# Patient Record
Sex: Male | Born: 1955 | Race: Black or African American | Hispanic: No | Marital: Married | Smoking: Never smoker
Health system: Southern US, Community
[De-identification: ages and names within clinical notes are randomized; demographics above are authoritative.]

## PROBLEM LIST (undated history)

## (undated) DIAGNOSIS — G4730 Sleep apnea, unspecified: Secondary | ICD-10-CM

## (undated) DIAGNOSIS — C61 Malignant neoplasm of prostate: Secondary | ICD-10-CM

## (undated) DIAGNOSIS — E785 Hyperlipidemia, unspecified: Secondary | ICD-10-CM

## (undated) DIAGNOSIS — D649 Anemia, unspecified: Secondary | ICD-10-CM

## (undated) DIAGNOSIS — H269 Unspecified cataract: Secondary | ICD-10-CM

## (undated) DIAGNOSIS — K635 Polyp of colon: Secondary | ICD-10-CM

## (undated) DIAGNOSIS — R7989 Other specified abnormal findings of blood chemistry: Secondary | ICD-10-CM

## (undated) DIAGNOSIS — Z9109 Other allergy status, other than to drugs and biological substances: Secondary | ICD-10-CM

## (undated) DIAGNOSIS — I10 Essential (primary) hypertension: Secondary | ICD-10-CM

## (undated) DIAGNOSIS — N200 Calculus of kidney: Secondary | ICD-10-CM

## (undated) DIAGNOSIS — E0590 Thyrotoxicosis, unspecified without thyrotoxic crisis or storm: Secondary | ICD-10-CM

## (undated) HISTORY — DX: Essential (primary) hypertension: I10

## (undated) HISTORY — DX: Anemia, unspecified: D64.9

## (undated) HISTORY — DX: Unspecified cataract: H26.9

## (undated) HISTORY — DX: Hyperlipidemia, unspecified: E78.5

## (undated) HISTORY — DX: Polyp of colon: K63.5

## (undated) HISTORY — DX: Sleep apnea, unspecified: G47.30

## (undated) HISTORY — DX: Calculus of kidney: N20.0

## (undated) HISTORY — DX: Other specified abnormal findings of blood chemistry: R79.89

## (undated) HISTORY — DX: Other allergy status, other than to drugs and biological substances: Z91.09

## (undated) HISTORY — PX: PROSTATE BIOPSY: SHX241

## (undated) HISTORY — PX: VASECTOMY: SHX75

---

## 1997-07-18 ENCOUNTER — Emergency Department (HOSPITAL_COMMUNITY): Admission: EM | Admit: 1997-07-18 | Discharge: 1997-07-18 | Payer: Self-pay | Admitting: Emergency Medicine

## 1998-06-07 ENCOUNTER — Encounter: Admission: RE | Admit: 1998-06-07 | Discharge: 1998-09-05 | Payer: Self-pay | Admitting: *Deleted

## 2000-08-20 ENCOUNTER — Encounter (INDEPENDENT_AMBULATORY_CARE_PROVIDER_SITE_OTHER): Payer: Self-pay

## 2000-08-20 ENCOUNTER — Other Ambulatory Visit: Admission: RE | Admit: 2000-08-20 | Discharge: 2000-08-20 | Payer: Self-pay | Admitting: Urology

## 2003-01-28 ENCOUNTER — Ambulatory Visit (HOSPITAL_COMMUNITY): Admission: RE | Admit: 2003-01-28 | Discharge: 2003-01-28 | Payer: Self-pay | Admitting: Urology

## 2003-01-28 ENCOUNTER — Ambulatory Visit (HOSPITAL_BASED_OUTPATIENT_CLINIC_OR_DEPARTMENT_OTHER): Admission: RE | Admit: 2003-01-28 | Discharge: 2003-01-28 | Payer: Self-pay | Admitting: Urology

## 2003-01-28 ENCOUNTER — Encounter (INDEPENDENT_AMBULATORY_CARE_PROVIDER_SITE_OTHER): Payer: Self-pay | Admitting: *Deleted

## 2010-04-04 ENCOUNTER — Ambulatory Visit (HOSPITAL_BASED_OUTPATIENT_CLINIC_OR_DEPARTMENT_OTHER)
Admission: RE | Admit: 2010-04-04 | Discharge: 2010-04-04 | Payer: Self-pay | Source: Home / Self Care | Attending: Family Medicine | Admitting: Family Medicine

## 2010-07-08 ENCOUNTER — Other Ambulatory Visit: Payer: Self-pay | Admitting: Family Medicine

## 2010-07-08 ENCOUNTER — Ambulatory Visit
Admission: RE | Admit: 2010-07-08 | Discharge: 2010-07-08 | Disposition: A | Discharge status: Home / Self Care | Payer: BC Managed Care – PPO | Source: Ambulatory Visit | Attending: Family Medicine | Admitting: Family Medicine

## 2010-07-08 DIAGNOSIS — R109 Unspecified abdominal pain: Secondary | ICD-10-CM

## 2010-07-13 ENCOUNTER — Ambulatory Visit
Admission: RE | Admit: 2010-07-13 | Discharge: 2010-07-13 | Disposition: A | Discharge status: Home / Self Care | Payer: BC Managed Care – PPO | Source: Ambulatory Visit | Attending: Family Medicine | Admitting: Family Medicine

## 2010-07-13 ENCOUNTER — Other Ambulatory Visit: Payer: Self-pay | Admitting: Family Medicine

## 2010-07-13 DIAGNOSIS — R110 Nausea: Secondary | ICD-10-CM

## 2010-07-13 MED ORDER — IOHEXOL 300 MG/ML  SOLN
125.0000 mL | Freq: Once | INTRAMUSCULAR | Status: AC | PRN
  Administered 2010-07-13: 125 mL via INTRAVENOUS

## 2010-08-08 ENCOUNTER — Other Ambulatory Visit (HOSPITAL_COMMUNITY): Payer: Self-pay | Admitting: Urology

## 2010-08-08 ENCOUNTER — Encounter (HOSPITAL_COMMUNITY): Payer: BC Managed Care – PPO

## 2010-08-08 ENCOUNTER — Other Ambulatory Visit: Payer: Self-pay | Admitting: Urology

## 2010-08-08 ENCOUNTER — Ambulatory Visit (HOSPITAL_COMMUNITY)
Admission: RE | Admit: 2010-08-08 | Discharge: 2010-08-08 | Disposition: A | Discharge status: Home / Self Care | Payer: BC Managed Care – PPO | Source: Ambulatory Visit | Attending: Urology | Admitting: Urology

## 2010-08-08 DIAGNOSIS — Z01818 Encounter for other preprocedural examination: Secondary | ICD-10-CM | POA: Insufficient documentation

## 2010-08-08 DIAGNOSIS — I10 Essential (primary) hypertension: Secondary | ICD-10-CM | POA: Insufficient documentation

## 2010-08-08 DIAGNOSIS — Z01812 Encounter for preprocedural laboratory examination: Secondary | ICD-10-CM | POA: Insufficient documentation

## 2010-08-08 DIAGNOSIS — Z87891 Personal history of nicotine dependence: Secondary | ICD-10-CM | POA: Insufficient documentation

## 2010-08-08 LAB — CBC
HCT: 34.9 % — ABNORMAL LOW (ref 39.0–52.0)
Hemoglobin: 11.8 g/dL — ABNORMAL LOW (ref 13.0–17.0)
MCH: 29.8 pg (ref 26.0–34.0)
MCHC: 33.8 g/dL (ref 30.0–36.0)
MCV: 88.1 fL (ref 78.0–100.0)
Platelets: 272 10*3/uL (ref 150–400)
RBC: 3.96 MIL/uL — ABNORMAL LOW (ref 4.22–5.81)
RDW: 12.3 % (ref 11.5–15.5)
WBC: 6.5 10*3/uL (ref 4.0–10.5)

## 2010-08-08 LAB — BASIC METABOLIC PANEL
BUN: 17 mg/dL (ref 6–23)
CO2: 26 mEq/L (ref 19–32)
Calcium: 9.1 mg/dL (ref 8.4–10.5)
Chloride: 105 mEq/L (ref 96–112)
Creatinine, Ser: 1.52 mg/dL — ABNORMAL HIGH (ref 0.4–1.5)
GFR calc Af Amer: 59 mL/min — ABNORMAL LOW (ref >60)
GFR calc non Af Amer: 48 mL/min — ABNORMAL LOW (ref >60)
Glucose, Bld: 208 mg/dL — ABNORMAL HIGH (ref 70–99)
Potassium: 4.2 mEq/L (ref 3.5–5.1)
Sodium: 139 mEq/L (ref 135–145)

## 2010-08-08 LAB — SURGICAL PCR SCREEN
MRSA, PCR: NEGATIVE
Staphylococcus aureus: NEGATIVE

## 2010-08-10 ENCOUNTER — Ambulatory Visit (HOSPITAL_COMMUNITY)
Admission: RE | Admit: 2010-08-10 | Discharge: 2010-08-10 | Disposition: A | Discharge status: Home / Self Care | Payer: BC Managed Care – PPO | Source: Ambulatory Visit | Attending: Urology | Admitting: Urology

## 2010-08-10 DIAGNOSIS — Z01812 Encounter for preprocedural laboratory examination: Secondary | ICD-10-CM | POA: Insufficient documentation

## 2010-08-10 DIAGNOSIS — I10 Essential (primary) hypertension: Secondary | ICD-10-CM | POA: Insufficient documentation

## 2010-08-10 DIAGNOSIS — N201 Calculus of ureter: Secondary | ICD-10-CM | POA: Insufficient documentation

## 2010-08-10 DIAGNOSIS — R109 Unspecified abdominal pain: Secondary | ICD-10-CM | POA: Insufficient documentation

## 2010-08-10 DIAGNOSIS — R112 Nausea with vomiting, unspecified: Secondary | ICD-10-CM | POA: Insufficient documentation

## 2010-08-10 LAB — GLUCOSE, CAPILLARY
Glucose-Capillary: 145 mg/dL — ABNORMAL HIGH (ref 70–99)
Glucose-Capillary: 152 mg/dL — ABNORMAL HIGH (ref 70–99)

## 2010-08-15 NOTE — Op Note (Signed)
NAMEMANVILLE, WOLAVER              ACCOUNT NO.:  [id number]  MEDICAL RECORD NO.:  [id number]           PATIENT TYPE:  O  LOCATION:  DAYL                         FACILITY:  [facility]  PHYSICIAN:  Bernestine Amass, M.D.  DATE OF BIRTH:  March 28, 1956  DATE OF PROCEDURE:  08/10/2010 DATE OF DISCHARGE:  08/10/2010                              OPERATIVE REPORT   PREOPERATIVE DIAGNOSIS:  Left distal ureteral calculus.  POSTOPERATIVE DIAGNOSIS:  Impacted left distal ureteral calculus.  PROCEDURES PERFORMED: 1. Cystoscopy. 2. Left retrograde pyelogram. 3. Ureteroscopy. 4. Holmium laser lithotripsy. 5. Basketing of stone fragments. 6. Left double-J stent placement.  SURGEON:  Bernestine Amass, M.D.  ANESTHESIA:  General.  INDICATIONS:  Mr. Lister is 55 years of age.  He had presented with left flank pain and was diagnosed with a 6-mm stone in his distal left ureter.  The patient had had the stone present for 3 to 4 weeks.  He has continued to have intermittent discomfort and has had no sign of any progression of passage of the stone.  In addition, he was scheduled to leave town for an extended training session and therefore wanted to have a definitive procedure performed.  Given the size of the stone and the situation, we felt that was quite prudent.  We suggested ureteroscopy as his best chance for definitive management.  That recommendation was accepted.  The patient has given full informed consent and presents now for the procedure.  TECHNIQUE AND FINDINGS:  The patient was brought to the operating room. He had placement of PAS compression boots and received perioperative ciprofloxacin.  He had successful induction of general anesthesia and was placed in lithotomy position and prepped and draped in usual manner. Cystoscopy revealed mild trilobar hyperplasia with unremarkable anterior urethra and bladder other than some very mild left hemitrigonal edema.  Retrograde pyelogram was done  with fluoroscopic interpretation using an open-ended catheter.  A 6-mm filling defect causing very high-grade obstruction was noted approximately 3 cm above his ureterovesical junction.  A very little to no contrast was able to get beyond the stone.  Through the open-ended catheter, we attempted for approximately 15 to 20 minutes to get a guidewire past the stone.  This included a standard sensor wire as well as a Glidewire but despite multiple manipulations through the open-ended catheter, we were unable to get access beyond the stone.  For that reason, I did engage the distal left ureter with the 6.5 French rigid ureteroscope.  We were able to carefully advance the scope to the heavily impacted stone.  Through the ureteroscope, I was then able to get a guidewire beyond the stone and with fluoroscopic guidance confirmed that to be in the left renal pelvis.  The ureteroscope was then removed from the ureter, leaving the guidewire behind.  An open- ended catheter was then placed.  With contrast injection, we confirmed that the wire was in good position and the patient did have substantially dilated proximal ureter and collecting system.  We then re- engaged the distal ureter with the ureteroscope following the wire up to the stone.  We then used a  holmium laser lithotriptor fiber to fracture the stone.  The stone itself was markedly dense.  We were able to eventually get this to fracture into 4 fragments.  Each of these pieces was then carefully basket extracted.  The patient had really significant mucosal edema, inflammation and mild oozing from where the stone had been heavily impacted.  There was no sign of any ureteral perforation or injury.  We felt it prudent to leave a double-J stent and a 7-French 24- cm stent was placed over the wire with fluoroscopic guidance and confirmed to be in good position.  The patient's bladder was emptied and he was brought to recovery room in stable  condition.     Bernestine Amass, M.D.     DSG/MEDQ  D:  08/11/2010  T:  08/12/2010  Job:  [id number]  Electronically Signed by Rana Snare M.D. on 08/15/2010 12:10:42 PM

## 2010-09-02 NOTE — Op Note (Signed)
NAME:  NEHEMIAH, SNARR                        ACCOUNT NO.:  [id number]   MEDICAL RECORD NO.:  [id number]                   PATIENT TYPE:  AMB   LOCATION:  [facility]                                 FACILITY:  [facility]   PHYSICIAN:  Bernestine Amass, M.D.               DATE OF BIRTH:  1955-04-27   DATE OF PROCEDURE:  01/28/2003  DATE OF DISCHARGE:                                 OPERATIVE REPORT   PREOPERATIVE DIAGNOSIS:  Request for elective sterilization status post  previous vasectomy with ongoing spermatogenesis.   POSTOPERATIVE DIAGNOSIS:  Request for elective sterilization status post  previous vasectomy with ongoing spermatogenesis.   PROCEDURE PERFORMED:  Redo bilateral vasectomy.   SURGEON:  Bernestine Amass, M.D.   ANESTHESIA:  MAC.   INDICATIONS FOR PROCEDURE:  Mr. Sidor is a 55 year old male. Approximately  2 to 2 1/2 years ago, he had a vasectomy. He came back for a followup visit  but never brought any of his semen analysis post vasectomy for assessment.  He brought one in recently which showed over 600,000,000 sperm. We reviewed  the pathology from the time of his surgery and it did show two vasal  sections. Reviewing the operative report at the time did reveal a very tight  scrotum. 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 We talked to him about several options including redo  vasectomy. We felt it might be better to do in an outpatient surgery center  where we might have ability to better relax the patient and decrease the  operative difficulties. He elected to proceed with repeat vasectomy. He  appears to understand the advantages and disadvantages and potential  complications.   TECHNIQUE:  The patient was brought to the operating room where he had  intravenous sedation. He was prepped and draped in the usual manner. Heat  was utilized to help relax the scrotum. We were able to palpate the right  vas. It was pinched  underneath the scrotal skin which was then anesthetized.  We made a small 1/2 cm to 1 cm incision overlying the vas which was then  grasped with special vas grasper. The adventitia was cleared for  approximately 2 cm and proximal and distal ends were clamped. A 1 cm plus  segment was then removed. Both ends were cauterized with a needle cautery  within the lumen and then tied. The more proximal end was buried in a  separate adventitial plane. The skin was then reapproximated with some  interrupted Monocryl. The same thing was done on the contralateral side. The  patient appeared to tolerate the procedure well. There were no obvious  complications.  Bernestine Amass, M.D.    DSG/MEDQ  D:  01/28/2003  T:  01/28/2003  Job:  [id number]

## 2010-11-26 LAB — HM COLONOSCOPY

## 2011-07-17 LAB — HM DIABETES EYE EXAM

## 2011-07-31 ENCOUNTER — Encounter: Payer: BC Managed Care – PPO | Attending: Family Medicine | Admitting: Dietician

## 2011-07-31 DIAGNOSIS — Z713 Dietary counseling and surveillance: Secondary | ICD-10-CM | POA: Insufficient documentation

## 2011-07-31 DIAGNOSIS — E119 Type 2 diabetes mellitus without complications: Secondary | ICD-10-CM | POA: Insufficient documentation

## 2011-08-01 ENCOUNTER — Encounter: Payer: Self-pay | Admitting: Dietician

## 2011-08-01 NOTE — Progress Notes (Signed)
  Patient was seen on 07/31/2011 for the first of a series of three diabetes self-management courses at the [facility]. The following learning objectives were met by the patient during this course:   Defines the role of glucose and insulin  Identifies type of diabetes and pathophysiology  Defines the diagnostic criteria for diabetes and prediabetes  States the risk factors for Type 2 Diabetes  States the symptoms of Type 2 Diabetes  Defines Type 2 Diabetes treatment goals  Defines Type 2 Diabetes treatment options  States the rationale for glucose monitoring  Identifies A1C, glucose targets, and testing times  Identifies proper sharps disposal  Defines the purpose of a diabetes food plan  Identifies carbohydrate food groups  Defines effects of carbohydrate foods on glucose levels  Identifies carbohydrate choices/grams/food labels  States benefits of physical activity and effect on glucose  Review of suggested activity guidelines  HgA1C:  10.1% (07/27/2011)  Handouts given during class include:  Type 2 Diabetes: Basics Book  My [place] and Activity Log  Patient has established the following initial goals:  Increase exercise  Monitor blood glucose  Keep doctors appointment  Take medications appropriately  Lose weight  Follow-Up Plan: Core Class 2 within 1 month

## 2011-08-23 IMAGING — CR DG CHEST 2V
2 series · 2 of 2 positions shown · non-contrast
Comparison: Abdominal series 07/08/2010.

CLINICAL DATA: Preop for cystoscopy.  History of hypertension.  Ex-
smoker.

CHEST - 2 VIEW

[w chest pa]
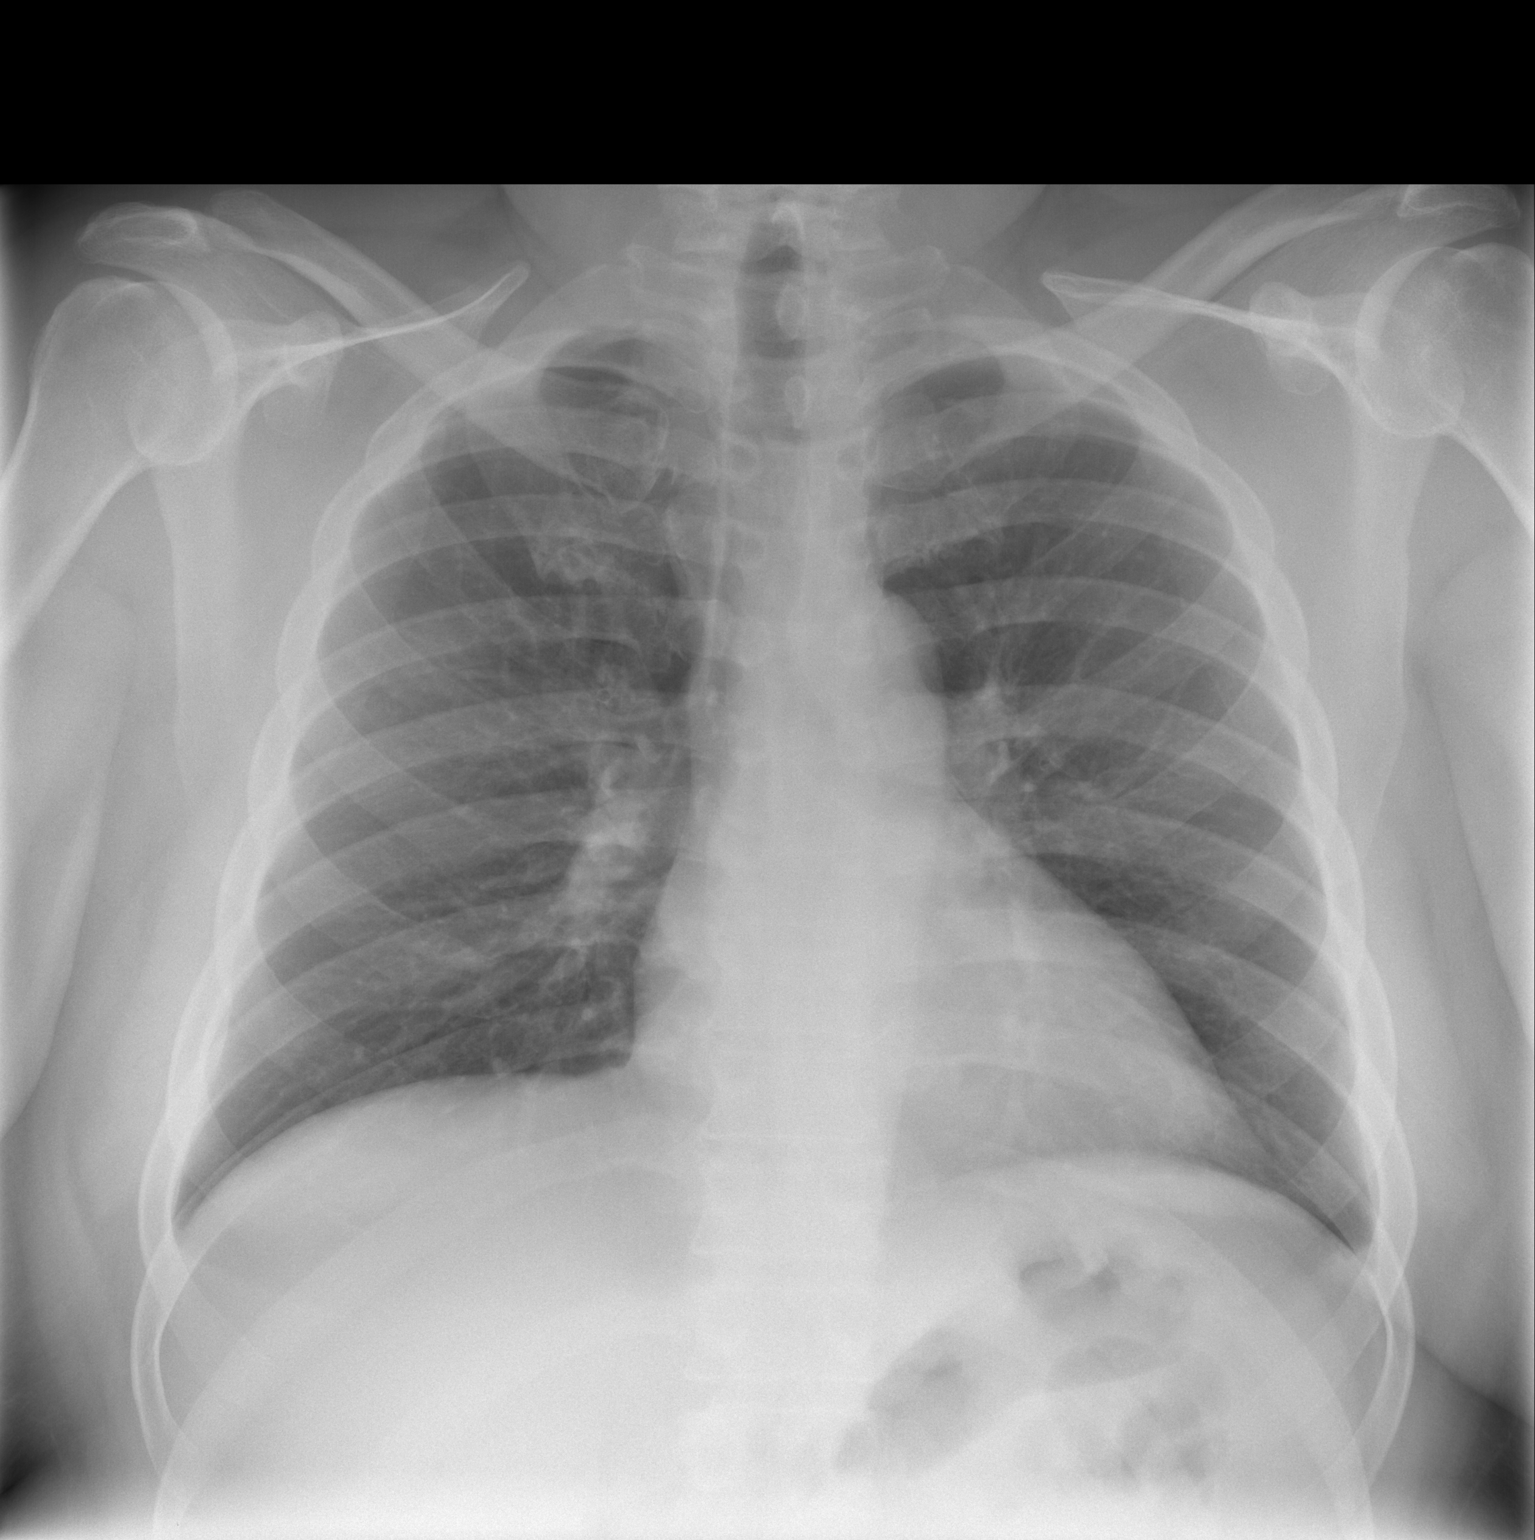

[w chest lat]
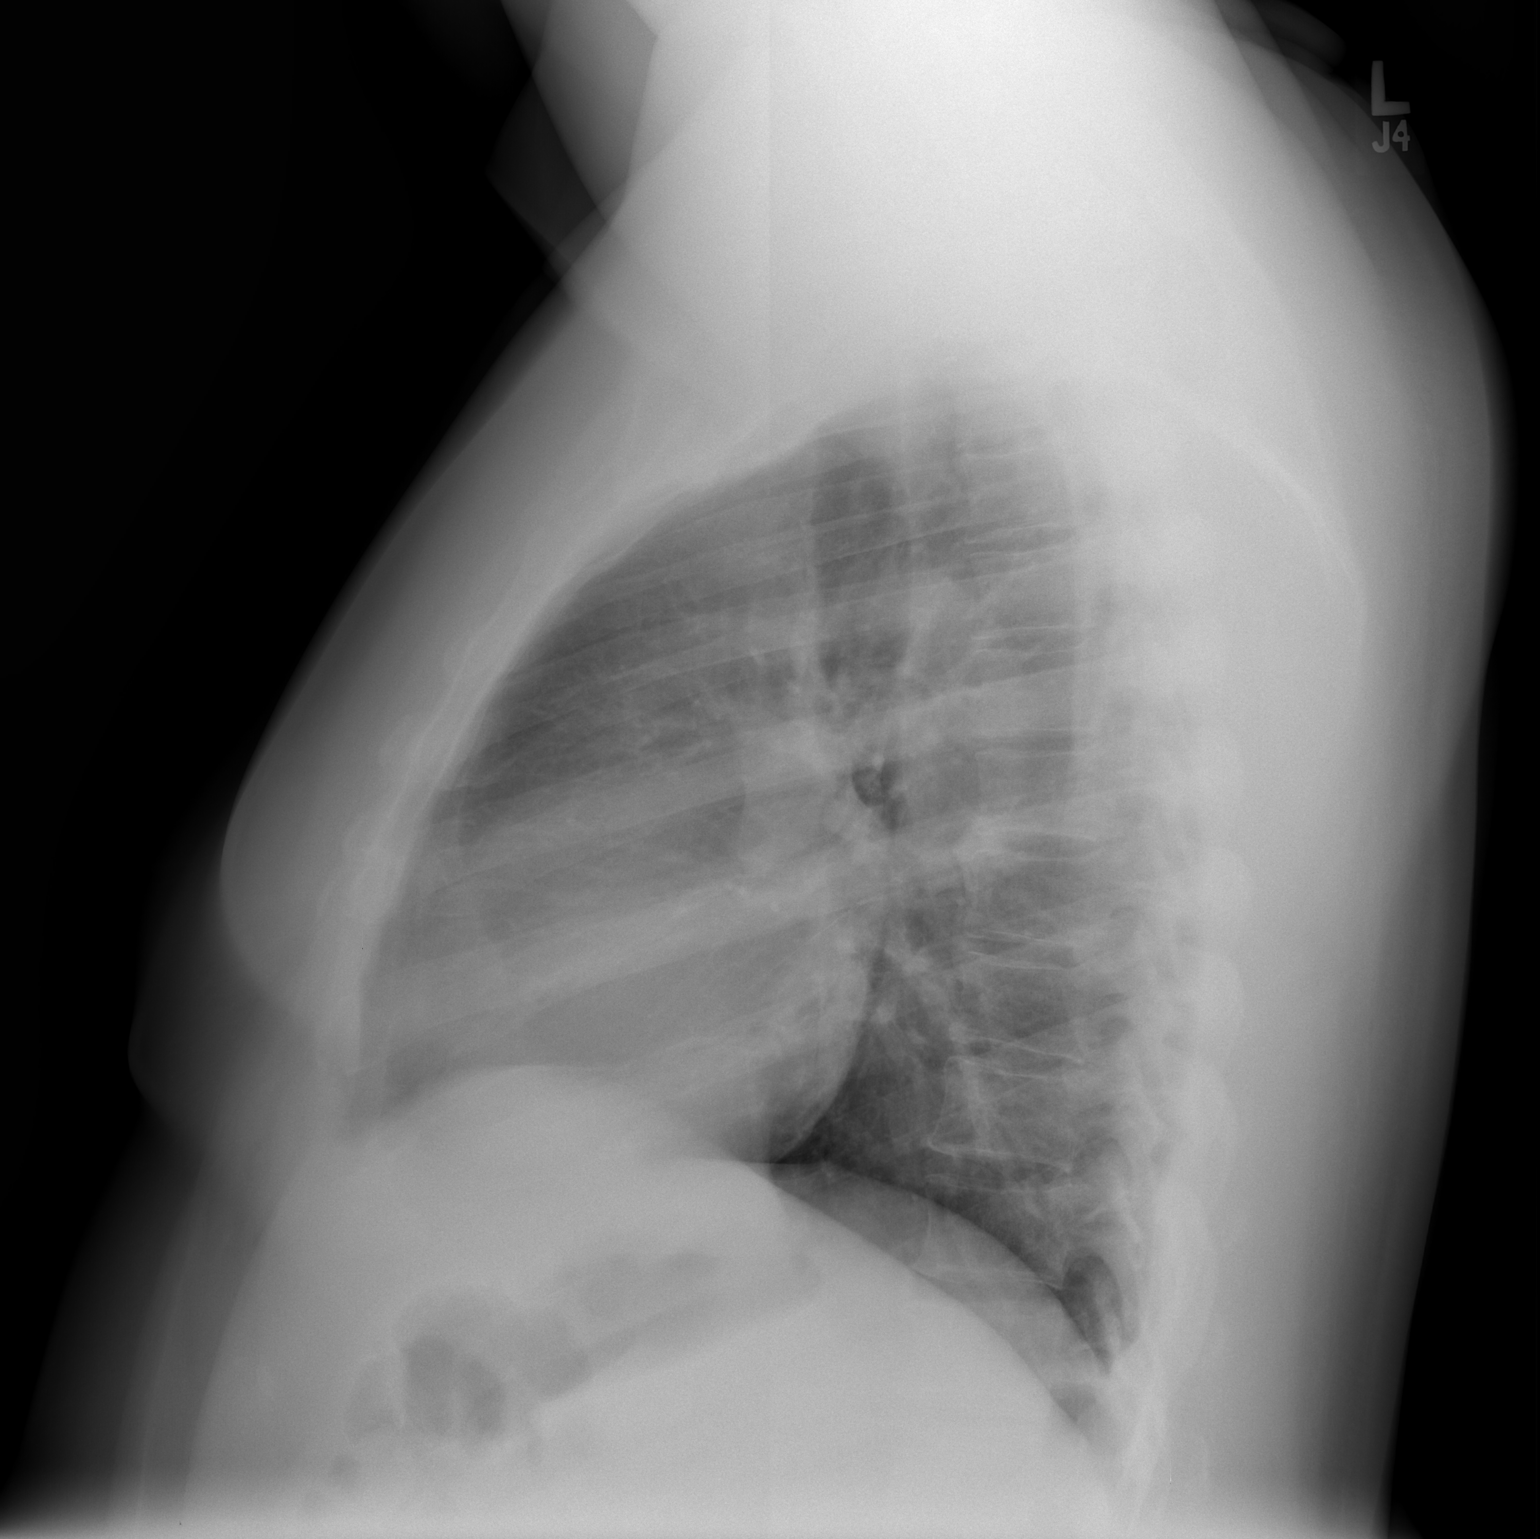

[2 of 2 positions shown; findings below may reference images not displayed]

FINDINGS: The heart size and mediastinal contours are stable.  The
lungs are clear.  There is no pleural effusion or pneumothorax.
The osseous structures appear unchanged.
IMPRESSION: Stable examination.  No active cardiopulmonary process.

## 2011-08-29 ENCOUNTER — Encounter: Payer: Federal, State, Local not specified - PPO | Attending: Family Medicine

## 2011-08-29 DIAGNOSIS — E119 Type 2 diabetes mellitus without complications: Secondary | ICD-10-CM | POA: Insufficient documentation

## 2011-08-29 DIAGNOSIS — Z713 Dietary counseling and surveillance: Secondary | ICD-10-CM | POA: Insufficient documentation

## 2011-09-05 ENCOUNTER — Ambulatory Visit: Payer: Federal, State, Local not specified - PPO

## 2012-04-17 LAB — HM DIABETES FOOT EXAM: HM Diabetic Foot Exam: 5800

## 2012-10-05 ENCOUNTER — Encounter (HOSPITAL_BASED_OUTPATIENT_CLINIC_OR_DEPARTMENT_OTHER): Payer: Self-pay | Admitting: *Deleted

## 2012-10-05 ENCOUNTER — Emergency Department (HOSPITAL_BASED_OUTPATIENT_CLINIC_OR_DEPARTMENT_OTHER)
Admission: EM | Admit: 2012-10-05 | Discharge: 2012-10-05 | Disposition: A | Discharge status: Home / Self Care | Payer: BC Managed Care – PPO | Attending: Emergency Medicine | Admitting: Emergency Medicine

## 2012-10-05 DIAGNOSIS — H6092 Unspecified otitis externa, left ear: Secondary | ICD-10-CM

## 2012-10-05 DIAGNOSIS — I10 Essential (primary) hypertension: Secondary | ICD-10-CM | POA: Insufficient documentation

## 2012-10-05 DIAGNOSIS — H6692 Otitis media, unspecified, left ear: Secondary | ICD-10-CM

## 2012-10-05 DIAGNOSIS — Z79899 Other long term (current) drug therapy: Secondary | ICD-10-CM | POA: Insufficient documentation

## 2012-10-05 DIAGNOSIS — E119 Type 2 diabetes mellitus without complications: Secondary | ICD-10-CM | POA: Insufficient documentation

## 2012-10-05 DIAGNOSIS — H6690 Otitis media, unspecified, unspecified ear: Secondary | ICD-10-CM | POA: Insufficient documentation

## 2012-10-05 DIAGNOSIS — E785 Hyperlipidemia, unspecified: Secondary | ICD-10-CM | POA: Insufficient documentation

## 2012-10-05 DIAGNOSIS — H60399 Other infective otitis externa, unspecified ear: Secondary | ICD-10-CM | POA: Insufficient documentation

## 2012-10-05 LAB — GLUCOSE, CAPILLARY: Glucose-Capillary: 318 mg/dL — ABNORMAL HIGH (ref 70–99)

## 2012-10-05 MED ORDER — AMOXICILLIN 250 MG PO CAPS: 250.0000 mg | ORAL_CAPSULE | Freq: Three times a day (TID) | ORAL | Status: DC

## 2012-10-05 MED ORDER — NEOMYCIN-COLIST-HC-THONZONIUM 3.3-3-10-0.5 MG/ML OT SUSP
4.0000 [drp] | Freq: Once | OTIC | Status: AC
  Administered 2012-10-05: 4 [drp] via OTIC
  Filled 2012-10-05: qty 5

## 2012-10-05 NOTE — ED Provider Notes (Signed)
Medical screening examination/treatment/procedure(s) were performed by non-physician practitioner and as supervising physician I was immediately available for consultation/collaboration.   Mylinda Latina III, MD 10/05/12 [phone]

## 2012-10-05 NOTE — ED Provider Notes (Signed)
History     CSN: [id number]  Arrival date & time 10/05/12  1411   First MD Initiated Contact with Patient 10/05/12 1430      Chief Complaint  Patient presents with  . Otalgia    (Consider location/radiation/quality/duration/timing/severity/associated sxs/prior treatment) HPI Alex Rocha is a 57 y.o. male who presents to the ED with ear ache in the left ear.  The ear was hurting some but he recently had a business trip that required air travel and now the pain is worse. The history was provided by the patient.   Past Medical History  Diagnosis Date  . Hyperlipidemia   . Hypertension   . Diabetes mellitus     History reviewed. No pertinent past surgical history.  History reviewed. No pertinent family history.  History  Substance Use Topics  . Smoking status: Never Smoker   . Smokeless tobacco: Not on file  . Alcohol Use: Yes      Review of Systems  Constitutional: Negative for fever and chills.  HENT: Positive for ear pain. Negative for sore throat, sneezing, neck pain and sinus pressure.   Respiratory: Negative for cough and wheezing.   Cardiovascular: Negative for chest pain.  Gastrointestinal: Negative for nausea and vomiting.  Musculoskeletal: Negative for back pain.  Psychiatric/Behavioral: The patient is not nervous/anxious.     Allergies  Review of patient's allergies indicates no known allergies.  Home Medications   Current Outpatient Rx  Name  Route  Sig  Dispense  Refill  . lisinopril (PRINIVIL,ZESTRIL) 40 MG tablet   Oral   Take 40 mg by mouth daily.         . metFORMIN (GLUCOPHAGE) 1000 MG tablet   Oral   Take 1,000 mg by mouth 2 (two) times daily with a meal.         . pravastatin (PRAVACHOL) 20 MG tablet   Oral   Take 20 mg by mouth daily.         . sitaGLIPtin (JANUVIA) 100 MG tablet   Oral   Take 100 mg by mouth daily.           BP 146/81  Pulse 104  Temp(Src) 98.5 F (36.9 C) (Oral)  Resp 20  Ht 6\' 1"  (1.854 m)   Wt 265 lb (120.203 kg)  BMI 34.97 kg/m2  SpO2 98%  Physical Exam  Nursing note and vitals reviewed. Constitutional: He is oriented to person, place, and time. He appears well-developed and well-nourished. No distress.  HENT:  Head: Normocephalic.  Right Ear: Tympanic membrane normal.  Left Ear: There is swelling and tenderness. Tympanic membrane is erythematous and retracted.  Mouth/Throat: Uvula is midline and mucous membranes are normal.  Left ear canal with swelling and exudate.  Eyes: EOM are normal.  Neck: Neck supple.  Pulmonary/Chest: Effort normal.  Musculoskeletal: Normal range of motion.  Neurological: He is alert and oriented to person, place, and time. No cranial nerve deficit.  Skin: Skin is warm and dry.  Psychiatric: He has a normal mood and affect. His behavior is normal.    ED Course  Procedures (including critical care time)   MDM  57 y.o. male with inner and external otitis. Will treat with antibiotics and Cortisporin Otic. Discussed with the patient clinical findings and plan of care and all questioned fully answered. He will return if any problems arise.    Medication List    TAKE these medications       amoxicillin 250 MG capsule  Commonly  known as:  AMOXIL  Take 1 capsule (250 mg total) by mouth 3 (three) times daily.      ASK your doctor about these medications       lisinopril 40 MG tablet  Commonly known as:  PRINIVIL,ZESTRIL  Take 40 mg by mouth daily.     metFORMIN 1000 MG tablet  Commonly known as:  GLUCOPHAGE  Take 1,000 mg by mouth 2 (two) times daily with a meal.     pravastatin 20 MG tablet  Commonly known as:  PRAVACHOL  Take 20 mg by mouth daily.     sitaGLIPtin 100 MG tablet  Commonly known as:  JANUVIA  Take 100 mg by mouth daily.              [place], [place] 10/05/12 [phone]

## 2012-10-05 NOTE — ED Notes (Signed)
CBG 318.  RN notified

## 2012-10-05 NOTE — ED Notes (Signed)
Pt states he flew to [facility] June 4th and left ear has been bothering him since Sometimes has problems with right.

## 2012-11-25 ENCOUNTER — Encounter: Payer: Self-pay | Admitting: Family Medicine

## 2012-11-25 ENCOUNTER — Ambulatory Visit (INDEPENDENT_AMBULATORY_CARE_PROVIDER_SITE_OTHER): Payer: BC Managed Care – PPO | Admitting: Family Medicine

## 2012-11-25 VITALS — BP 110/70 | HR 62 | Temp 98.7°F | Ht 74.0 in | Wt 273.2 lb

## 2012-11-25 DIAGNOSIS — H6690 Otitis media, unspecified, unspecified ear: Secondary | ICD-10-CM

## 2012-11-25 DIAGNOSIS — E1165 Type 2 diabetes mellitus with hyperglycemia: Secondary | ICD-10-CM

## 2012-11-25 DIAGNOSIS — E119 Type 2 diabetes mellitus without complications: Secondary | ICD-10-CM

## 2012-11-25 DIAGNOSIS — E785 Hyperlipidemia, unspecified: Secondary | ICD-10-CM

## 2012-11-25 DIAGNOSIS — IMO0002 Reserved for concepts with insufficient information to code with codable children: Secondary | ICD-10-CM

## 2012-11-25 DIAGNOSIS — E291 Testicular hypofunction: Secondary | ICD-10-CM

## 2012-11-25 DIAGNOSIS — R7989 Other specified abnormal findings of blood chemistry: Secondary | ICD-10-CM

## 2012-11-25 DIAGNOSIS — H6692 Otitis media, unspecified, left ear: Secondary | ICD-10-CM | POA: Insufficient documentation

## 2012-11-25 DIAGNOSIS — I10 Essential (primary) hypertension: Secondary | ICD-10-CM | POA: Insufficient documentation

## 2012-11-25 MED ORDER — ACETIC ACID 2 % OT SOLN: OTIC | Status: DC

## 2012-11-25 MED ORDER — CEFUROXIME AXETIL 500 MG PO TABS: 500.0000 mg | ORAL_TABLET | Freq: Two times a day (BID) | ORAL | Status: AC

## 2012-11-25 NOTE — Assessment & Plan Note (Signed)
Pt has had multiple abx and gtts Refer to ENT See meds and orders

## 2012-11-25 NOTE — Assessment & Plan Note (Signed)
stable °

## 2012-11-25 NOTE — Progress Notes (Signed)
  Subjective:     Alex Rocha is a 57 y.o. male who presents with ear pain and possible ear infection. Symptoms include: left ear pain, congestion and plugged sensation in the left ear. Onset of symptoms was several weeks ago, and have been gradually worsening since that time. Associated symptoms include: congestion and headache.  Patient denies: chills, fever , post nasal drip and productive cough. He is drinking plenty of fluids.  The following portions of the patient's history were reviewed and updated as appropriate: allergies, current medications, past family history, past medical history, past social history, past surgical history and problem list.  Review of Systems Pertinent items are noted in HPI.   Objective:    BP 110/70  Pulse 62  Temp(Src) 98.7 F (37.1 C) (Oral)  Ht 6\' 2"  (1.88 m)  Wt 273 lb 3.2 oz (123.923 kg)  BMI 35.06 kg/m2  SpO2 95% General:  alert, cooperative, appears stated age and mild distress  Right Ear: normal  Left Ear: Tm is errythematous and _ + fluid  Mouth:  lips, mucosa, and tongue normal; teeth and gums normal  Neck: mild anterior cervical adenopathy, supple, symmetrical, trachea midline and thyroid not enlarged, symmetric, no tenderness/mass/nodules   cor--+S1S2 no murmur Lungs--+CTABL  No rrw Ext--- no CCE, Sensory exam of the foot is normal, tested with the monofilament. Good pulses, no lesions or ulcers, good peripheral pulses.   Assessment:    Left acute otitis media   Plan:    Treatment: ceftin, vosol. OTC analgesia as needed. Fluids, rest, avoid carbonated/alcoholic and caffeinated beverages.  Follow up in a few weeks Refer to ENT and return if not improving.

## 2012-11-25 NOTE — Patient Instructions (Addendum)
Preventive Care for Adults, Male  A healthy lifestyle and preventive care can promote health and wellness. Preventive health guidelines for men include the following key practices:  · A routine yearly physical is a good way to check with your caregiver about your health and preventative screening. It is a chance to share any concerns and updates on your health, and to receive a thorough exam.  · Visit your dentist for a routine exam and preventative care every 6 months. Brush your teeth twice a day and floss once a day. Good oral hygiene prevents tooth decay and gum disease.  · The frequency of eye exams is based on your age, health, family medical history, use of contact lenses, and other factors. Follow your caregiver's recommendations for frequency of eye exams.  · Eat a healthy diet. Foods like vegetables, fruits, whole grains, low-fat dairy products, and lean protein foods contain the nutrients you need without too many calories. Decrease your intake of foods high in solid fats, added sugars, and salt. Eat the right amount of calories for you. Get information about a proper diet from your caregiver, if necessary.  · Regular physical exercise is one of the most important things you can do for your health. Most adults should get at least 150 minutes of moderate-intensity exercise (any activity that increases your heart rate and causes you to sweat) each week. In addition, most adults need muscle-strengthening exercises on 2 or more days a week.  · Maintain a healthy weight. The body mass index (BMI) is a screening tool to identify possible weight problems. It provides an estimate of body fat based on height and weight. Your caregiver can help determine your BMI, and can help you achieve or maintain a healthy weight. For adults 20 years and older:  · A BMI below 18.5 is considered underweight.  · A BMI of 18.5 to 24.9 is normal.  · A BMI of 25 to 29.9 is considered overweight.  · A BMI of 30 and above is  considered obese.  · Maintain normal blood lipids and cholesterol levels by exercising and minimizing your intake of saturated fat. Eat a balanced diet with plenty of fruit and vegetables. Blood tests for lipids and cholesterol should begin at age 20 and be repeated every 5 years. If your lipid or cholesterol levels are high, you are over 50, or you are a high risk for heart disease, you may need your cholesterol levels checked more frequently. Ongoing high lipid and cholesterol levels should be treated with medicines if diet and exercise are not effective.  · If you smoke, find out from your caregiver how to quit. If you do not use tobacco, do not start.  · If you choose to drink alcohol, do not exceed 2 drinks per day. One drink is considered to be 12 ounces (355 mL) of beer, 5 ounces (148 mL) of wine, or 1.5 ounces (44 mL) of liquor.  · Avoid use of street drugs. Do not share needles with anyone. Ask for help if you need support or instructions about stopping the use of drugs.  · High blood pressure causes heart disease and increases the risk of stroke. Your blood pressure should be checked at least every 1 to 2 years. Ongoing high blood pressure should be treated with medicines, if weight loss and exercise are not effective.  · If you are 45 to 55 years old, ask your caregiver if you should take aspirin to prevent heart disease.  · Diabetes screening involves taking   a blood sample to check your fasting blood sugar level. This should be done once every 3 years, after age 45, if you are within normal weight and without risk factors for diabetes. Testing should be considered at a younger age or be carried out more frequently if you are overweight and have at least 1 risk factor for diabetes.  · Colorectal cancer can be detected and often prevented. Most routine colorectal cancer screening begins at the age of 50 and continues through age 75. However, your caregiver may recommend screening at an earlier age if you  have risk factors for colon cancer. On a yearly basis, your caregiver may provide home test kits to check for hidden blood in the stool. Use of a small camera at the end of a tube, to directly examine the colon (sigmoidoscopy or colonoscopy), can detect the earliest forms of colorectal cancer. Talk to your caregiver about this at age 50, when routine screening begins.  Direct examination of the colon should be repeated every 5 to 10 years through age 75, unless early forms of pre-cancerous polyps or small growths are found.  · Hepatitis C blood testing is recommended for all people born from 1945 through 1965 and any individual with known risks for hepatitis C.  · Practice safe sex. Use condoms and avoid high-risk sexual practices to reduce the spread of sexually transmitted infections (STIs). STIs include gonorrhea, chlamydia, syphilis, trichomonas, herpes, HPV, and human immunodeficiency virus (HIV). Herpes, HIV, and HPV are viral illnesses that have no cure. They can result in disability, cancer, and death.  · A one-time screening for abdominal aortic aneurysm (AAA) and surgical repair of large AAAs by sound wave imaging (ultrasonography) is recommended for ages 65 to 75 years who are current or former smokers.  · Healthy men should no longer receive prostate-specific antigen (PSA) blood tests as part of routine cancer screening. Consult with your caregiver about prostate cancer screening.  · Testicular cancer screening is not recommended for adult males who have no symptoms. Screening includes self-exam, caregiver exam, and other screening tests. Consult with your caregiver about any symptoms you have or any concerns you have about testicular cancer.  · Use sunscreen with skin protection factor (SPF) of 30 or more. Apply sunscreen liberally and repeatedly throughout the day. You should seek shade when your shadow is shorter than you. Protect yourself by wearing long sleeves, pants, a wide-brimmed hat, and  sunglasses year round, whenever you are outdoors.  · Once a month, do a whole body skin exam, using a mirror to look at the skin on your back. Notify your caregiver of new moles, moles that have irregular borders, moles that are larger than a pencil eraser, or moles that have changed in shape or color.  · Stay current with required immunizations.  · Influenza. You need a dose every fall (or winter). The composition of the flu vaccine changes each year, so being vaccinated once is not enough.  · Pneumococcal polysaccharide. You need 1 to 2 doses if you smoke cigarettes or if you have certain chronic medical conditions. You need 1 dose at age 65 (or older) if you have never been vaccinated.  · Tetanus, diphtheria, pertussis (Tdap, Td). Get 1 dose of Tdap vaccine if you are younger than age 65 years, are over 65 and have contact with an infant, are a healthcare worker, or simply want to be protected from whooping cough. After that, you need a Td booster dose every 10 years. Consult your   caregiver if you have not had at least 3 tetanus and diphtheria-containing shots sometime in your life or have a deep or dirty wound.  · HPV. This vaccine is recommended for males 13 through 55 years of age. This vaccine may be given to men 22 through 55 years of age who have not completed the 3 dose series. It is recommended for men through age 26 who have sex with men or whose immune system is weakened because of HIV infection, other illness, or medications. The vaccine is given in 3 doses over 6 months.  · Measles, mumps, rubella (MMR). You need at least 1 dose of MMR if you were born in 1957 or later. You may also need a 2nd dose.  · Meningococcal. If you are age 19 to 21 years and a first-year college student living in a residence hall, or have one of several medical conditions, you need to get vaccinated against meningococcal disease. You may also need additional booster doses.  · Zoster (shingles). If you are age 60 years or  older, you should get this vaccine.  · Varicella (chickenpox). If you have never had chickenpox or you were vaccinated but received only 1 dose, talk to your caregiver to find out if you need this vaccine.  · Hepatitis A. You need this vaccine if you have a specific risk factor for hepatitis A virus infection, or you simply wish to be protected from this disease. The vaccine is usually given as 2 doses, 6 to 18 months apart.  · Hepatitis B. You need this vaccine if you have a specific risk factor for hepatitis B virus infection or you simply wish to be protected from this disease. The vaccine is given in 3 doses, usually over 6 months.  Preventative Service / Frequency  Ages 19 to 39  · Blood pressure check.** / Every 1 to 2 years.  · Lipid and cholesterol check.** / Every 5 years beginning at age 20.  · Hepatitis C blood test.** / For any individual with known risks for hepatitis C.  · Skin self-exam. / Monthly.  · Influenza immunization.** / Every year.  · Pneumococcal polysaccharide immunization.** / 1 to 2 doses if you smoke cigarettes or if you have certain chronic medical conditions.  · Tetanus, diphtheria, pertussis (Tdap,Td) immunization. / A one-time dose of Tdap vaccine. After that, you need a Td booster dose every 10 years.  · HPV immunization. / 3 doses over 6 months, if 26 and younger.  · Measles, mumps, rubella (MMR) immunization. / You need at least 1 dose of MMR if you were born in 1957 or later. You may also need a 2nd dose.  · Meningococcal immunization. / 1 dose if you are age 19 to 21 years and a first-year college student living in a residence hall, or have one of several medical conditions, you need to get vaccinated against meningococcal disease. You may also need additional booster doses.  · Varicella immunization.** / Consult your caregiver.  · Hepatitis A immunization.** / Consult your caregiver. 2 doses, 6 to 18 months apart.  · Hepatitis B immunization.** / Consult your caregiver. 3 doses  usually over 6 months.  Ages 40 to 64  · Blood pressure check.** / Every 1 to 2 years.  · Lipid and cholesterol check.** / Every 5 years beginning at age 20.  · Fecal occult blood test (FOBT) of stool. / Every year beginning at age 50 and continuing until age 75. You may not have   to do this test if you get colonoscopy every 10 years.  · Flexible sigmoidoscopy** or colonoscopy.** / Every 5 years for a flexible sigmoidoscopy or every 10 years for a colonoscopy beginning at age 50 and continuing until age 75.  · Hepatitis C blood test.** / For all people born from 1945 through 1965 and any individual with known risks for hepatitis C.  · Skin self-exam. / Monthly.  · Influenza immunization.** / Every year.  · Pneumococcal polysaccharide immunization.** / 1 to 2 doses if you smoke cigarettes or if you have certain chronic medical conditions.  · Tetanus, diphtheria, pertussis (Tdap/Td) immunization.** / A one-time dose of Tdap vaccine. After that, you need a Td booster dose every 10 years.  · Measles, mumps, rubella (MMR) immunization.  / You need at least 1 dose of MMR if you were born in 1957 or later. You may also need a 2nd dose.  · Varicella immunization.**/ Consult your caregiver.  · Meningococcal immunization.** / Consult your caregiver.  · Hepatitis A immunization.** / Consult your caregiver. 2 doses, 6 to 18 months apart.  · Hepatitis B immunization.** / Consult your caregiver. 3 doses, usually over 6 months.  Ages 65 and over  · Blood pressure check.** / Every 1 to 2 years.  · Lipid and cholesterol check.**/ Every 5 years beginning at age 20.  · Fecal occult blood test (FOBT) of stool. / Every year beginning at age 50 and continuing until age 75. You may not have to do this test if you get colonoscopy every 10 years.  · Flexible sigmoidoscopy** or colonoscopy.** / Every 5 years for a flexible sigmoidoscopy or every 10 years for a colonoscopy beginning at age 50 and continuing until age 75.  · Hepatitis C blood  test.** / For all people born from 1945 through 1965 and any individual with known risks for hepatitis C.  · Abdominal aortic aneurysm (AAA) screening.** / A one-time screening for ages 65 to 75 years who are current or former smokers.  · Skin self-exam. / Monthly.  · Influenza immunization.** / Every year.  · Pneumococcal polysaccharide immunization.** / 1 dose at age 65 (or older) if you have never been vaccinated.  · Tetanus, diphtheria, pertussis (Tdap, Td) immunization. / A one-time dose of Tdap vaccine if you are over 65 and have contact with an infant, are a healthcare worker, or simply want to be protected from whooping cough. After that, you need a Td booster dose every 10 years.  · Varicella immunization. ** / Consult your caregiver.  · Meningococcal immunization.** / Consult your caregiver.  · Hepatitis A immunization. ** / Consult your caregiver. 2 doses, 6 to 18 months apart.  · Hepatitis B immunization.** / Check with your caregiver. 3 doses, usually over 6 months.  **Family history and personal history of risk and conditions may change your caregiver's recommendations.  Document Released: 05/30/2001 Document Revised: 06/26/2011 Document Reviewed: 08/29/2010  [employer]® Patient Information ©2014 [employer].

## 2012-11-25 NOTE — Assessment & Plan Note (Signed)
Refer to urology Pt is on testosterone shots

## 2012-11-25 NOTE — Assessment & Plan Note (Signed)
Check labs Cont' meds 

## 2012-11-25 NOTE — Assessment & Plan Note (Signed)
con't meds  Check labs 

## 2012-11-27 ENCOUNTER — Other Ambulatory Visit: Payer: BC Managed Care – PPO

## 2012-12-04 ENCOUNTER — Other Ambulatory Visit (INDEPENDENT_AMBULATORY_CARE_PROVIDER_SITE_OTHER): Payer: BC Managed Care – PPO

## 2012-12-04 DIAGNOSIS — IMO0002 Reserved for concepts with insufficient information to code with codable children: Secondary | ICD-10-CM

## 2012-12-04 DIAGNOSIS — E1165 Type 2 diabetes mellitus with hyperglycemia: Secondary | ICD-10-CM

## 2012-12-04 DIAGNOSIS — E785 Hyperlipidemia, unspecified: Secondary | ICD-10-CM

## 2012-12-04 LAB — CBC WITH DIFFERENTIAL/PLATELET
Basophils Absolute: 0 10*3/uL (ref 0.0–0.1)
Basophils Relative: 0.8 % (ref 0.0–3.0)
Eosinophils Absolute: 0.2 10*3/uL (ref 0.0–0.7)
Eosinophils Relative: 3.1 % (ref 0.0–5.0)
HCT: 36.1 % — ABNORMAL LOW (ref 39.0–52.0)
Hemoglobin: 12.8 g/dL — ABNORMAL LOW (ref 13.0–17.0)
Lymphocytes Relative: 39.2 % (ref 12.0–46.0)
Lymphs Abs: 2 10*3/uL (ref 0.7–4.0)
MCHC: 35.5 g/dL (ref 30.0–36.0)
MCV: 89.6 fl (ref 78.0–100.0)
Monocytes Absolute: 0.4 10*3/uL (ref 0.1–1.0)
Monocytes Relative: 7.6 % (ref 3.0–12.0)
Neutro Abs: 2.5 10*3/uL (ref 1.4–7.7)
Neutrophils Relative %: 49.3 % (ref 43.0–77.0)
Platelets: 264 10*3/uL (ref 150.0–400.0)
RBC: 4.03 Mil/uL — ABNORMAL LOW (ref 4.22–5.81)
RDW: 13.1 % (ref 11.5–14.6)
WBC: 5.2 10*3/uL (ref 4.5–10.5)

## 2012-12-04 LAB — BASIC METABOLIC PANEL
BUN: 20 mg/dL (ref 6–23)
CO2: 24 mEq/L (ref 19–32)
Calcium: 9.4 mg/dL (ref 8.4–10.5)
Chloride: 103 mEq/L (ref 96–112)
Creatinine, Ser: 1.2 mg/dL (ref 0.4–1.5)
GFR: 78.6 mL/min (ref >60.00)
Glucose, Bld: 250 mg/dL — ABNORMAL HIGH (ref 70–99)
Potassium: 4.2 mEq/L (ref 3.5–5.1)
Sodium: 136 mEq/L (ref 135–145)

## 2012-12-04 LAB — HEPATIC FUNCTION PANEL
ALT: 23 U/L (ref 0–53)
AST: 23 U/L (ref 0–37)
Albumin: 4.3 g/dL (ref 3.5–5.2)
Alkaline Phosphatase: 55 U/L (ref 39–117)
Bilirubin, Direct: 0 mg/dL (ref 0.0–0.3)
Total Bilirubin: 0.2 mg/dL — ABNORMAL LOW (ref 0.3–1.2)
Total Protein: 7.4 g/dL (ref 6.0–8.3)

## 2012-12-04 LAB — LIPID PANEL
Cholesterol: 163 mg/dL (ref 0–200)
HDL: 35.5 mg/dL — ABNORMAL LOW (ref >39.00)
Total CHOL/HDL Ratio: 5
Triglycerides: 479 mg/dL — ABNORMAL HIGH (ref 0.0–149.0)
VLDL: 95.8 mg/dL — ABNORMAL HIGH (ref 0.0–40.0)

## 2012-12-04 LAB — LDL CHOLESTEROL, DIRECT: Direct LDL: 55.7 mg/dL

## 2012-12-04 LAB — TSH: TSH: 0.32 u[IU]/mL — ABNORMAL LOW (ref 0.35–5.50)

## 2012-12-04 LAB — HEMOGLOBIN A1C: Hgb A1c MFr Bld: 11.3 % — ABNORMAL HIGH (ref 4.6–6.5)

## 2012-12-04 NOTE — Progress Notes (Unsigned)
Pt could not give a urine sample. Alex Sibal T.

## 2012-12-05 LAB — MICROALBUMIN / CREATININE URINE RATIO
Creatinine,U: 156.2 mg/dL
Microalb Creat Ratio: 0.7 mg/g (ref 0.0–30.0)
Microalb, Ur: 1.1 mg/dL (ref 0.0–1.9)

## 2012-12-11 ENCOUNTER — Telehealth: Payer: Self-pay | Admitting: Family Medicine

## 2012-12-11 DIAGNOSIS — E0590 Thyrotoxicosis, unspecified without thyrotoxic crisis or storm: Secondary | ICD-10-CM

## 2012-12-11 MED ORDER — SITAGLIP PHOS-METFORMIN HCL ER 100-1000 MG PO TB24: 1.0000 | ORAL_TABLET | Freq: Every day | ORAL | Status: DC

## 2012-12-11 MED ORDER — FENOFIBRATE MICRONIZED 90 MG PO CAPS: 1.0000 | ORAL_CAPSULE | Freq: Every day | ORAL | Status: DC

## 2012-12-11 NOTE — Telephone Encounter (Addendum)
Discussed with patient and he voiced understanding. Rx sent and copy mailed     KP

## 2012-12-11 NOTE — Telephone Encounter (Signed)
DM not controlled----- d/c metformin and change to Janumet xr 100/1000 1 po qpm #30 2 refills Cholesterol--- LDL goal < 70, HDL >40, TG < 150. Diet and exercise will increase HDL and decrease LDL and TG. Fish, Fish Oil, Flaxseed oil will also help increase the HDL and decrease Triglycerides. antara 90 #30 1 po qd , 2 refills Recheck 3 months 272.4 250.02 Lipid hep bmp, hgba1c, microalbumin .

## 2012-12-11 NOTE — Telephone Encounter (Signed)
Patient is calling back about his recent lab results. He can be reached on mobile number. Please advise.

## 2012-12-13 ENCOUNTER — Other Ambulatory Visit: Payer: BC Managed Care – PPO

## 2012-12-13 DIAGNOSIS — E0590 Thyrotoxicosis, unspecified without thyrotoxic crisis or storm: Secondary | ICD-10-CM

## 2012-12-14 LAB — T3, FREE: T3, Free: 4.2 pg/mL (ref 2.3–4.2)

## 2012-12-14 LAB — T4, FREE: Free T4: 1.25 ng/dL (ref 0.80–1.80)

## 2012-12-14 LAB — TSH: TSH: 0.17 u[IU]/mL — ABNORMAL LOW (ref 0.350–4.500)

## 2012-12-19 ENCOUNTER — Telehealth: Payer: Self-pay | Admitting: Family Medicine

## 2012-12-19 DIAGNOSIS — E0590 Thyrotoxicosis, unspecified without thyrotoxic crisis or storm: Secondary | ICD-10-CM

## 2012-12-19 NOTE — Telephone Encounter (Signed)
Patient calling requesting to speak with [name]. Did not want to leave reason for call.  Please advise.

## 2012-12-19 NOTE — Telephone Encounter (Signed)
Hyperthyroid--- refer to Endo

## 2012-12-19 NOTE — Telephone Encounter (Signed)
Patient aware and voiced understanding. Ref put in       [place]

## 2012-12-31 ENCOUNTER — Ambulatory Visit (INDEPENDENT_AMBULATORY_CARE_PROVIDER_SITE_OTHER): Payer: BC Managed Care – PPO | Admitting: Endocrinology

## 2012-12-31 ENCOUNTER — Encounter: Payer: Self-pay | Admitting: Endocrinology

## 2012-12-31 VITALS — BP 128/78 | HR 68 | Ht 73.0 in | Wt 270.0 lb

## 2012-12-31 DIAGNOSIS — E0590 Thyrotoxicosis, unspecified without thyrotoxic crisis or storm: Secondary | ICD-10-CM

## 2012-12-31 MED ORDER — LOSARTAN POTASSIUM 100 MG PO TABS: 100.0000 mg | ORAL_TABLET | Freq: Every day | ORAL | Status: DC

## 2012-12-31 NOTE — Patient Instructions (Addendum)
let's check an ultrasound and thyroid "scan" (a special, but easy and painless type of thyroid x ray).  It works like this: you go to the x-ray department of the hospital to swallow a pill, which contains a miniscule amount of radiation.  You will not notice any symptoms from this.  You will go back to the x-ray department the next day, to lie down in front of a camera.  The results of this will be sent to me.   Based on the results, i hope to order for you a treatment pill of radioactive iodine.  Although it is a larger amount of radiation, you will again notice no symptoms from this.  The pill is gone from your body in a few days (during which you should stay away from other people), but takes several months to work.  Therefore, please return here approximately 6-8 weeks after the treatment.  This treatment has been available for many years, and the only known side-effect is an underactive thyroid.  It is possible that i would eventually prescribe for you a thyroid hormone pill, which is very inexpensive.  You don't have to worry about side-effects of this thyroid hormone pill, because it is the same molecule your thyroid makes. i have sent a prescription to your pharmacy, for a blood pressure pill, to see if that will help your cough.

## 2012-12-31 NOTE — Progress Notes (Signed)
Subjective:    Patient ID: Alex Rocha, male    DOB: 03/22/1956, 57 y.o.   MRN: [id number]  HPI Pt was noted on routine labs 1 month ago to have a suppressed TSH.  He is unaware of ever having had a thyroid problem before.  He reports slight weakness throughout the body, but no assoc fever. Past Medical History  Diagnosis Date  . Hyperlipidemia   . Hypertension   . Diabetes mellitus   . Environmental allergies   . Kidney stones   . Colon polyps   . Low testosterone     No past surgical history on file.  History   Social History  . Marital Status: Single    Spouse Name: N/A    Number of Children: N/A  . Years of Education: N/A   Occupational History  . Not on file.   Social History Main Topics  . Smoking status: Never Smoker   . Smokeless tobacco: Not on file  . Alcohol Use: Yes  . Drug Use: No  . Sexual Activity: Not on file   Other Topics Concern  . Not on file   Social History Narrative  . No narrative on file    Current Outpatient Prescriptions on File Prior to Visit  Medication Sig Dispense Refill  . acetic acid (VOSOL) 2 % otic solution 5 drops in affected ear  Tid  15 mL  0  . Fenofibrate Micronized (ANTARA) 90 MG CAPS Take 1 capsule by mouth daily.  30 capsule  2  . lisinopril (PRINIVIL,ZESTRIL) 40 MG tablet Take 40 mg by mouth daily.      . pravastatin (PRAVACHOL) 20 MG tablet Take 20 mg by mouth daily.      . SitaGLIPtin-MetFORMIN HCl [phone] MG TB24 Take 1 tablet by mouth daily.  30 tablet  2   No current facility-administered medications on file prior to visit.    No Known Allergies  Family History  Problem Relation Age of Onset  . Colon cancer Mother   . Colon cancer Brother   . Hyperlipidemia Brother     Mother's side of the family  . Heart disease Sister   . Heart disease Mother   . Hypertension Mother     Mother's side of the family  . Diabetes Mother     Mother's side of the family  . Diabetes      father's family  no  thyroid problems  BP 128/78  Pulse 68  Ht 6\' 1"  (1.854 m)  Wt 270 lb (122.471 kg)  BMI 35.63 kg/m2  SpO2 98%  Review of Systems denies weight loss, headache, hoarseness, double vision, palpitations, diarrhea, polyuria, myalgias, numbness, tremor, anxiety, hypoglycemia, easy bruising, and rhinorrhea.  He has slight doe and excessive diaphoresis.    Objective:   Physical Exam VS: see vs page GEN: no distress HEAD: head: no deformity eyes: no periorbital swelling, no proptosis external nose and ears are normal mouth: no lesion seen NECK: i think i can feel the top of a goiter, but I can't be sure.   CHEST WALL: no deformity LUNGS: clear to auscultation BREASTS:  Bilateral pseudogynecomastia CV: reg rate and rhythm, no murmur ABD: abdomen is soft, nontender.  no hepatosplenomegaly.  not distended.  no hernia.   MUSCULOSKELETAL: muscle bulk and strength are grossly normal.  no obvious joint swelling.  gait is normal and steady.  EXTEMITIES: no deformity.  no ulcer on the feet.  feet are of normal color and temp.  no edema PULSES: dorsalis pedis intact bilat.  no carotid bruit.   NEURO:  cn 2-12 grossly intact.   readily moves all 4's.  sensation is intact to touch on the feet.   SKIN:  Normal texture and temperature.  No rash or suspicious lesion is visible.   NODES:  None palpable at the neck.   PSYCH: alert, oriented x3.  Does not appear anxious nor depressed.   Lab Results  Component Value Date   TSH 0.170* 12/13/2012      Assessment & Plan:  Hyperthyroidism: new, uncertain etiology. Weakness.  Uncertain if this is due to the hyperthyroidism.   Cough, possibly due to acei.

## 2013-01-01 ENCOUNTER — Ambulatory Visit (HOSPITAL_BASED_OUTPATIENT_CLINIC_OR_DEPARTMENT_OTHER)
Admission: RE | Admit: 2013-01-01 | Discharge: 2013-01-01 | Disposition: A | Discharge status: Home / Self Care | Payer: BC Managed Care – PPO | Source: Ambulatory Visit | Attending: Endocrinology | Admitting: Endocrinology

## 2013-01-01 DIAGNOSIS — E0510 Thyrotoxicosis with toxic single thyroid nodule without thyrotoxic crisis or storm: Secondary | ICD-10-CM | POA: Insufficient documentation

## 2013-01-01 DIAGNOSIS — E0590 Thyrotoxicosis, unspecified without thyrotoxic crisis or storm: Secondary | ICD-10-CM

## 2013-01-01 DIAGNOSIS — R599 Enlarged lymph nodes, unspecified: Secondary | ICD-10-CM | POA: Insufficient documentation

## 2013-01-16 ENCOUNTER — Ambulatory Visit (HOSPITAL_COMMUNITY): Payer: BC Managed Care – PPO

## 2013-01-17 ENCOUNTER — Other Ambulatory Visit (HOSPITAL_COMMUNITY): Payer: BC Managed Care – PPO

## 2013-01-21 ENCOUNTER — Encounter (HOSPITAL_COMMUNITY)
Admission: RE | Admit: 2013-01-21 | Discharge: 2013-01-21 | Disposition: A | Discharge status: Home / Self Care | Payer: BC Managed Care – PPO | Source: Ambulatory Visit | Attending: Endocrinology | Admitting: Endocrinology

## 2013-01-21 DIAGNOSIS — E0590 Thyrotoxicosis, unspecified without thyrotoxic crisis or storm: Secondary | ICD-10-CM

## 2013-01-22 ENCOUNTER — Encounter (HOSPITAL_COMMUNITY): Payer: Self-pay

## 2013-01-22 ENCOUNTER — Other Ambulatory Visit: Payer: Self-pay | Admitting: Endocrinology

## 2013-01-22 ENCOUNTER — Encounter (HOSPITAL_COMMUNITY)
Admission: RE | Admit: 2013-01-22 | Discharge: 2013-01-22 | Disposition: A | Discharge status: Home / Self Care | Payer: BC Managed Care – PPO | Source: Ambulatory Visit | Attending: Endocrinology | Admitting: Endocrinology

## 2013-01-22 DIAGNOSIS — E0590 Thyrotoxicosis, unspecified without thyrotoxic crisis or storm: Secondary | ICD-10-CM | POA: Insufficient documentation

## 2013-01-22 DIAGNOSIS — E042 Nontoxic multinodular goiter: Secondary | ICD-10-CM | POA: Insufficient documentation

## 2013-01-22 MED ORDER — SODIUM PERTECHNETATE TC 99M INJECTION
10.1000 | Freq: Once | INTRAVENOUS | Status: AC | PRN
  Administered 2013-01-22: 10.1 via INTRAVENOUS

## 2013-01-22 MED ORDER — SODIUM IODIDE I 131 CAPSULE
8.7000 | Freq: Once | INTRAVENOUS | Status: AC | PRN
  Administered 2013-01-22: 8.7 via ORAL

## 2013-01-30 ENCOUNTER — Other Ambulatory Visit (HOSPITAL_COMMUNITY)
Admission: RE | Admit: 2013-01-30 | Discharge: 2013-01-30 | Disposition: A | Discharge status: Home / Self Care | Payer: BC Managed Care – PPO | Source: Ambulatory Visit | Attending: Interventional Radiology | Admitting: Interventional Radiology

## 2013-01-30 ENCOUNTER — Ambulatory Visit
Admission: RE | Admit: 2013-01-30 | Discharge: 2013-01-30 | Disposition: A | Discharge status: Home / Self Care | Payer: BC Managed Care – PPO | Source: Ambulatory Visit | Attending: Endocrinology | Admitting: Endocrinology

## 2013-01-30 DIAGNOSIS — E042 Nontoxic multinodular goiter: Secondary | ICD-10-CM

## 2013-01-30 DIAGNOSIS — E041 Nontoxic single thyroid nodule: Secondary | ICD-10-CM | POA: Insufficient documentation

## 2013-01-31 ENCOUNTER — Other Ambulatory Visit: Payer: Self-pay | Admitting: Endocrinology

## 2013-01-31 DIAGNOSIS — E0590 Thyrotoxicosis, unspecified without thyrotoxic crisis or storm: Secondary | ICD-10-CM

## 2013-02-03 ENCOUNTER — Telehealth: Payer: Self-pay | Admitting: Endocrinology

## 2013-02-03 NOTE — Telephone Encounter (Signed)
Pt is returning a call, not sure who called. Requests call back, says it may be in regards to lab results. CB# [phone]

## 2013-02-17 ENCOUNTER — Telehealth: Payer: Self-pay | Admitting: Family Medicine

## 2013-02-17 NOTE — Telephone Encounter (Signed)
Patient called and requested a refill for acetic acid (VOSOL) 2 % otic solution. Thanks    Pharmacy [facility] [address]

## 2013-02-17 NOTE — Telephone Encounter (Signed)
He shouldn't still need drops.   He should see ent if he has not seen them

## 2013-02-17 NOTE — Telephone Encounter (Signed)
Please advise      KP 

## 2013-02-18 ENCOUNTER — Other Ambulatory Visit: Payer: Self-pay | Admitting: *Deleted

## 2013-02-18 DIAGNOSIS — E119 Type 2 diabetes mellitus without complications: Secondary | ICD-10-CM

## 2013-02-18 DIAGNOSIS — E785 Hyperlipidemia, unspecified: Secondary | ICD-10-CM

## 2013-02-18 DIAGNOSIS — E781 Pure hyperglyceridemia: Secondary | ICD-10-CM

## 2013-02-18 MED ORDER — PRAVASTATIN SODIUM 20 MG PO TABS: 20.0000 mg | ORAL_TABLET | Freq: Every day | ORAL | Status: DC

## 2013-02-18 MED ORDER — LOSARTAN POTASSIUM 100 MG PO TABS: 100.0000 mg | ORAL_TABLET | Freq: Every day | ORAL | Status: DC

## 2013-02-18 MED ORDER — SITAGLIP PHOS-METFORMIN HCL ER 100-1000 MG PO TB24: 1.0000 | ORAL_TABLET | Freq: Every day | ORAL | Status: DC

## 2013-02-18 MED ORDER — FENOFIBRATE MICRONIZED 90 MG PO CAPS: 1.0000 | ORAL_CAPSULE | Freq: Every day | ORAL | Status: DC

## 2013-02-18 NOTE — Telephone Encounter (Signed)
Refills for all medications except for eye drops sent to [facility] on [address].

## 2013-02-19 ENCOUNTER — Telehealth: Payer: Self-pay | Admitting: *Deleted

## 2013-02-19 ENCOUNTER — Other Ambulatory Visit: Payer: Self-pay | Admitting: Family Medicine

## 2013-02-19 LAB — HM DIABETES EYE EXAM

## 2013-02-19 MED ORDER — ACETIC ACID 2 % OT SOLN: OTIC | Status: DC

## 2013-02-19 NOTE — Telephone Encounter (Signed)
Ok to refill drops

## 2013-02-19 NOTE — Telephone Encounter (Signed)
Patient called back. ENT refilled  His prescription. No follow-up needed at this time.

## 2013-02-19 NOTE — Telephone Encounter (Signed)
02/19/2013  Pt came by office this morning.  ENT, Jerrell Belfast, told pt he has refill on drops, if he needed to continue on these it was ok.  Pharmacy says there is no refill.  Prescription was originally given by [facility], ENT confirmed that was the best treatment.  Symptoms went away for aprox 2 weeks after using drops, but symptoms have returned.  [name] does not have available appt today, pt is going to call ENT to see if he can be seen there, or if he can refill drops until pt can be seen by [name].  Pt will call today to inform what ENT does and will decide from there when he can get in with [facility].  He is having Thyroid procedure tomorrow 02/20/2013 and was told he is to be isolated from public for 3 days.  Please advise if there is anything we can do before next week.  bw

## 2013-02-19 NOTE — Telephone Encounter (Signed)
Alex Rocha, [place] at 02/19/2013 8:07 AM    Status: Signed        02/19/2013 Pt came by office this morning. ENT, Jerrell Belfast, told pt he has refill on drops, if he needed to continue on these it was ok. Pharmacy says there is no refill. Prescription was originally given by [facility], ENT confirmed that was the best treatment. Symptoms went away for aprox 2 weeks after using drops, but symptoms have returned. [name] does not have available appt today, pt is going to call ENT to see if he can be seen there, or if he can refill drops until pt can be seen by [name]. Pt will call today to inform what ENT does and will decide from there when he can get in with [facility]. He is having Thyroid procedure tomorrow 02/20/2013 and was told he is to be isolated from public for 3 days.  Please advise if there is anything we can do before next week. bw

## 2013-02-20 ENCOUNTER — Encounter (HOSPITAL_COMMUNITY): Payer: Self-pay

## 2013-02-20 ENCOUNTER — Encounter (HOSPITAL_COMMUNITY)
Admission: RE | Admit: 2013-02-20 | Discharge: 2013-02-20 | Disposition: A | Discharge status: Home / Self Care | Payer: BC Managed Care – PPO | Source: Ambulatory Visit | Attending: Endocrinology | Admitting: Endocrinology

## 2013-02-20 DIAGNOSIS — E0590 Thyrotoxicosis, unspecified without thyrotoxic crisis or storm: Secondary | ICD-10-CM | POA: Insufficient documentation

## 2013-02-20 HISTORY — DX: Thyrotoxicosis, unspecified without thyrotoxic crisis or storm: E05.90

## 2013-02-20 MED ORDER — SODIUM IODIDE I 131 CAPSULE
34.4000 | Freq: Once | INTRAVENOUS | Status: AC | PRN
  Administered 2013-02-20: 34.4 via ORAL

## 2013-04-28 ENCOUNTER — Ambulatory Visit: Payer: BC Managed Care – PPO | Admitting: Family Medicine

## 2013-04-28 ENCOUNTER — Ambulatory Visit (INDEPENDENT_AMBULATORY_CARE_PROVIDER_SITE_OTHER): Payer: BC Managed Care – PPO | Admitting: Endocrinology

## 2013-04-28 ENCOUNTER — Encounter: Payer: Self-pay | Admitting: Endocrinology

## 2013-04-28 VITALS — BP 122/68 | HR 89 | Temp 98.9°F | Ht 75.0 in | Wt 272.0 lb

## 2013-04-28 DIAGNOSIS — Z23 Encounter for immunization: Secondary | ICD-10-CM

## 2013-04-28 DIAGNOSIS — E0590 Thyrotoxicosis, unspecified without thyrotoxic crisis or storm: Secondary | ICD-10-CM

## 2013-04-28 LAB — T4, FREE: Free T4: 0.87 ng/dL (ref 0.60–1.60)

## 2013-04-28 LAB — TSH: TSH: 0.47 u[IU]/mL (ref 0.35–5.50)

## 2013-04-28 NOTE — Progress Notes (Signed)
   Subjective:    Patient ID: Alex Rocha, male    DOB: 11/03/1955, 58 y.o.   MRN: [id number]  HPI In November of 2014, pt had i-131 rx for hyperthyroidism, due to a multinodular goiter.  bx was benign.  pt states he feels no different, and well in general.   Past Medical History  Diagnosis Date  . Hyperlipidemia   . Hypertension   . Diabetes mellitus   . Environmental allergies   . Kidney stones   . Colon polyps   . Low testosterone   . Hyperthyroidism     No past surgical history on file.  History   Social History  . Marital Status: Married    Spouse Name: N/A    Number of Children: N/A  . Years of Education: N/A   Occupational History  . Not on file.   Social History Main Topics  . Smoking status: Never Smoker   . Smokeless tobacco: Not on file  . Alcohol Use: Yes  . Drug Use: No  . Sexual Activity: Not on file   Other Topics Concern  . Not on file   Social History Narrative  . No narrative on file    Current Outpatient Prescriptions on File Prior to Visit  Medication Sig Dispense Refill  . acetic acid (VOSOL) 2 % otic solution 5 drops in affected ear  Tid  15 mL  5  . Fenofibrate Micronized (ANTARA) 90 MG CAPS Take 1 capsule by mouth daily.  30 capsule  5  . losartan (COZAAR) 100 MG tablet Take 1 tablet (100 mg total) by mouth daily.  90 tablet  3  . pravastatin (PRAVACHOL) 20 MG tablet Take 1 tablet (20 mg total) by mouth daily.  30 tablet  5  . SitaGLIPtin-MetFORMIN HCl [phone] MG TB24 Take 1 tablet by mouth daily.  30 tablet  5   No current facility-administered medications on file prior to visit.    No Known Allergies  Family History  Problem Relation Age of Onset  . Colon cancer Mother   . Colon cancer Brother   . Hyperlipidemia Brother     Mother's side of the family  . Heart disease Sister   . Heart disease Mother   . Hypertension Mother     Mother's side of the family  . Diabetes Mother     Mother's side of the family  . Diabetes       father's family    BP 122/68  Pulse 89  Temp(Src) 98.9 F (37.2 C) (Oral)  Ht 6\' 3"  (1.905 m)  Wt 272 lb (123.378 kg)  BMI 34.00 kg/m2  SpO2 98%  Review of Systems Denies weight change.    Objective:   Physical Exam VITAL SIGNS:  See vs page GENERAL: no distress Neck: possible multinodular goiter, but i can't be certain.    Lab Results  Component Value Date   TSH 0.47 04/28/2013      Assessment & Plan:  Hyperthyroidism, better after i-131 rx

## 2013-04-28 NOTE — Patient Instructions (Signed)
blood tests are being requested for you today.  We'll contact you with results. Please come back for a follow-up appointment in 6 weeks.   

## 2013-05-30 ENCOUNTER — Emergency Department (HOSPITAL_BASED_OUTPATIENT_CLINIC_OR_DEPARTMENT_OTHER)
Admission: EM | Admit: 2013-05-30 | Discharge: 2013-05-30 | Disposition: A | Discharge status: Home / Self Care | Payer: BC Managed Care – PPO | Attending: Emergency Medicine | Admitting: Emergency Medicine

## 2013-05-30 ENCOUNTER — Emergency Department (HOSPITAL_BASED_OUTPATIENT_CLINIC_OR_DEPARTMENT_OTHER): Payer: BC Managed Care – PPO

## 2013-05-30 ENCOUNTER — Encounter (HOSPITAL_BASED_OUTPATIENT_CLINIC_OR_DEPARTMENT_OTHER): Payer: Self-pay | Admitting: Emergency Medicine

## 2013-05-30 DIAGNOSIS — Z87442 Personal history of urinary calculi: Secondary | ICD-10-CM | POA: Insufficient documentation

## 2013-05-30 DIAGNOSIS — J159 Unspecified bacterial pneumonia: Secondary | ICD-10-CM | POA: Insufficient documentation

## 2013-05-30 DIAGNOSIS — E785 Hyperlipidemia, unspecified: Secondary | ICD-10-CM | POA: Insufficient documentation

## 2013-05-30 DIAGNOSIS — I10 Essential (primary) hypertension: Secondary | ICD-10-CM | POA: Insufficient documentation

## 2013-05-30 DIAGNOSIS — R1110 Vomiting, unspecified: Secondary | ICD-10-CM | POA: Insufficient documentation

## 2013-05-30 DIAGNOSIS — Z860100 Personal history of colon polyps, unspecified: Secondary | ICD-10-CM | POA: Insufficient documentation

## 2013-05-30 DIAGNOSIS — J189 Pneumonia, unspecified organism: Secondary | ICD-10-CM

## 2013-05-30 DIAGNOSIS — E119 Type 2 diabetes mellitus without complications: Secondary | ICD-10-CM | POA: Insufficient documentation

## 2013-05-30 DIAGNOSIS — Z79899 Other long term (current) drug therapy: Secondary | ICD-10-CM | POA: Insufficient documentation

## 2013-05-30 LAB — URINE MICROSCOPIC-ADD ON

## 2013-05-30 LAB — CBC WITH DIFFERENTIAL/PLATELET
Basophils Absolute: 0 10*3/uL (ref 0.0–0.1)
Basophils Relative: 0 % (ref 0–1)
Eosinophils Absolute: 0 10*3/uL (ref 0.0–0.7)
Eosinophils Relative: 1 % (ref 0–5)
HCT: 37.6 % — ABNORMAL LOW (ref 39.0–52.0)
Hemoglobin: 12.9 g/dL — ABNORMAL LOW (ref 13.0–17.0)
Lymphocytes Relative: 10 % — ABNORMAL LOW (ref 12–46)
Lymphs Abs: 0.3 10*3/uL — ABNORMAL LOW (ref 0.7–4.0)
MCH: 30.9 pg (ref 26.0–34.0)
MCHC: 34.3 g/dL (ref 30.0–36.0)
MCV: 90.2 fL (ref 78.0–100.0)
Monocytes Absolute: 0.3 10*3/uL (ref 0.1–1.0)
Monocytes Relative: 9 % (ref 3–12)
Neutro Abs: 2.7 10*3/uL (ref 1.7–7.7)
Neutrophils Relative %: 80 % — ABNORMAL HIGH (ref 43–77)
Platelets: 185 10*3/uL (ref 150–400)
RBC: 4.17 MIL/uL — ABNORMAL LOW (ref 4.22–5.81)
RDW: 12.7 % (ref 11.5–15.5)
WBC: 3.4 10*3/uL — ABNORMAL LOW (ref 4.0–10.5)

## 2013-05-30 LAB — COMPREHENSIVE METABOLIC PANEL
ALT: 38 U/L (ref 0–53)
AST: 30 U/L (ref 0–37)
Albumin: 3.5 g/dL (ref 3.5–5.2)
Alkaline Phosphatase: 46 U/L (ref 39–117)
BUN: 24 mg/dL — ABNORMAL HIGH (ref 6–23)
CO2: 21 mEq/L (ref 19–32)
Calcium: 8.5 mg/dL (ref 8.4–10.5)
Chloride: 95 mEq/L — ABNORMAL LOW (ref 96–112)
Creatinine, Ser: 1.1 mg/dL (ref 0.50–1.35)
GFR calc Af Amer: 86 mL/min — ABNORMAL LOW (ref >90)
GFR calc non Af Amer: 74 mL/min — ABNORMAL LOW (ref >90)
Glucose, Bld: 367 mg/dL — ABNORMAL HIGH (ref 70–99)
Potassium: 3.9 mEq/L (ref 3.7–5.3)
Sodium: 134 mEq/L — ABNORMAL LOW (ref 137–147)
Total Bilirubin: 0.5 mg/dL (ref 0.3–1.2)
Total Protein: 6.8 g/dL (ref 6.0–8.3)

## 2013-05-30 LAB — URINALYSIS, ROUTINE W REFLEX MICROSCOPIC
Bilirubin Urine: NEGATIVE
Glucose, UA: >1000 mg/dL — AB
Hgb urine dipstick: NEGATIVE
Ketones, ur: NEGATIVE mg/dL
Leukocytes, UA: NEGATIVE
Nitrite: NEGATIVE
Protein, ur: NEGATIVE mg/dL
Specific Gravity, Urine: 1.03 (ref 1.005–1.030)
Urobilinogen, UA: 1 mg/dL (ref 0.0–1.0)
pH: 6 (ref 5.0–8.0)

## 2013-05-30 LAB — LIPASE, BLOOD: Lipase: 44 U/L (ref 11–59)

## 2013-05-30 MED ORDER — LEVOFLOXACIN 500 MG PO TABS: 500.0000 mg | ORAL_TABLET | Freq: Every day | ORAL | Status: DC

## 2013-05-30 MED ORDER — SODIUM CHLORIDE 0.9 % IV BOLUS (SEPSIS)
1000.0000 mL | Freq: Once | INTRAVENOUS | Status: AC
  Administered 2013-05-30: 1000 mL via INTRAVENOUS

## 2013-05-30 MED ORDER — ONDANSETRON HCL 4 MG/2ML IJ SOLN
4.0000 mg | Freq: Once | INTRAMUSCULAR | Status: AC
  Administered 2013-05-30: 4 mg via INTRAVENOUS
  Filled 2013-05-30: qty 2

## 2013-05-30 MED ORDER — KETOROLAC TROMETHAMINE 30 MG/ML IJ SOLN
30.0000 mg | Freq: Once | INTRAMUSCULAR | Status: AC
  Administered 2013-05-30: 30 mg via INTRAVENOUS
  Filled 2013-05-30: qty 1

## 2013-05-30 MED ORDER — ONDANSETRON 4 MG PO TBDP: 4.0000 mg | ORAL_TABLET | Freq: Three times a day (TID) | ORAL | Status: DC | PRN

## 2013-05-30 MED ORDER — ACETAMINOPHEN 325 MG PO TABS
650.0000 mg | ORAL_TABLET | Freq: Once | ORAL | Status: AC
  Administered 2013-05-30: 650 mg via ORAL
  Filled 2013-05-30: qty 2

## 2013-05-30 NOTE — Discharge Instructions (Signed)
Take Levaquin as directed until gone. Take zofran as needed for nausea. Refer to attached documents for more information. Return to the ED with worsening or concerning symptoms.

## 2013-05-30 NOTE — ED Notes (Signed)
Fever, diarrhea and vomiting since yesterday. Has not had Tylenol for the fever.

## 2013-05-30 NOTE — ED Provider Notes (Signed)
CSN: [id number]     Arrival date & time 05/30/13  1320 History   First MD Initiated Contact with Patient 05/30/13 1429     Chief Complaint  Patient presents with  . Fever  . Emesis     (Consider location/radiation/quality/duration/timing/severity/associated sxs/prior Treatment) Patient is a 58 y.o. male presenting with fever. The history is provided by the patient. No language interpreter was used.  Fever Max temp prior to arrival:  Uknown Temp source:  Subjective Severity:  Moderate Onset quality:  Gradual Duration:  3 days Timing:  Constant Progression:  Worsening Chronicity:  New Relieved by:  None tried Worsened by:  Nothing tried Ineffective treatments:  None tried Associated symptoms: vomiting   Associated symptoms: no chest pain, no chills, no diarrhea, no dysuria and no nausea   Vomiting:    Quality:  Stomach contents   Number of occurrences:  3   Severity:  Moderate   Duration:  3 days   Timing:  Intermittent   Progression:  Unchanged Risk factors: no hx of cancer, no immunosuppression, no occupational exposure, no recent surgery, no recent travel and no sick contacts     Past Medical History  Diagnosis Date  . Hyperlipidemia   . Hypertension   . Diabetes mellitus   . Environmental allergies   . Kidney stones   . Colon polyps   . Low testosterone   . Hyperthyroidism    History reviewed. No pertinent past surgical history. Family History  Problem Relation Age of Onset  . Colon cancer Mother   . Colon cancer Brother   . Hyperlipidemia Brother     Mother's side of the family  . Heart disease Sister   . Heart disease Mother   . Hypertension Mother     Mother's side of the family  . Diabetes Mother     Mother's side of the family  . Diabetes      father's family   History  Substance Use Topics  . Smoking status: Never Smoker   . Smokeless tobacco: Not on file  . Alcohol Use: Yes    Review of Systems  Constitutional: Positive for fever.  Negative for chills and fatigue.  HENT: Negative for trouble swallowing.   Eyes: Negative for visual disturbance.  Respiratory: Negative for shortness of breath.   Cardiovascular: Negative for chest pain and palpitations.  Gastrointestinal: Positive for vomiting. Negative for nausea, abdominal pain and diarrhea.  Genitourinary: Negative for dysuria and difficulty urinating.  Musculoskeletal: Negative for arthralgias and neck pain.  Skin: Negative for color change.  Neurological: Negative for dizziness and weakness.  Psychiatric/Behavioral: Negative for dysphoric mood.      Allergies  Review of patient's allergies indicates no known allergies.  Home Medications   Current Outpatient Rx  Name  Route  Sig  Dispense  Refill  . acetic acid (VOSOL) 2 % otic solution      5 drops in affected ear  Tid   15 mL   5   . Fenofibrate Micronized (ANTARA) 90 MG CAPS   Oral   Take 1 capsule by mouth daily.   30 capsule   [place]: [id number] Group: [id number] ID: [id number]   . losartan (COZAAR) 100 MG tablet   Oral   Take 1 tablet (100 mg total) by mouth daily.   90 tablet   3   . pravastatin (PRAVACHOL) 20 MG tablet   Oral   Take 1 tablet (20 mg total) by mouth daily.  30 tablet   5   . SitaGLIPtin-MetFORMIN HCl [phone] MG TB24   Oral   Take 1 tablet by mouth daily.   30 tablet   5    BP 153/73  Pulse 106  Temp(Src) 99.5 F (37.5 C) (Oral)  Resp 18  Ht 6\' 2"  (1.88 m)  Wt 272 lb (123.378 kg)  BMI 34.91 kg/m2  SpO2 97% Physical Exam  Nursing note and vitals reviewed. Constitutional: He is oriented to person, place, and time. He appears well-developed and well-nourished. No distress.  HENT:  Head: Normocephalic and atraumatic.  Mouth/Throat: Oropharynx is clear and moist. No oropharyngeal exudate.  Eyes: Conjunctivae and EOM are normal.  Neck: Normal range of motion.  Cardiovascular: Normal rate and regular rhythm.  Exam reveals no gallop and no friction rub.   No  murmur heard. Pulmonary/Chest: Effort normal. He has no wheezes. He has no rales. He exhibits no tenderness.  Crackles noted at right base.   Abdominal: Soft. There is no tenderness.  Musculoskeletal: Normal range of motion.  Neurological: He is alert and oriented to person, place, and time. Coordination normal.  Speech is goal-oriented. Moves limbs without ataxia.   Skin: Skin is warm and dry.  Psychiatric: He has a normal mood and affect. His behavior is normal.    ED Course  Procedures (including critical care time) Labs Review Labs Reviewed  URINALYSIS, ROUTINE W REFLEX MICROSCOPIC - Abnormal; Notable for the following:    Glucose, UA >1000 (*)    All other components within normal limits  CBC WITH DIFFERENTIAL - Abnormal; Notable for the following:    WBC 3.4 (*)    RBC 4.17 (*)    Hemoglobin 12.9 (*)    HCT 37.6 (*)    Neutrophils Relative % 80 (*)    Lymphocytes Relative 10 (*)    Lymphs Abs 0.3 (*)    All other components within normal limits  COMPREHENSIVE METABOLIC PANEL - Abnormal; Notable for the following:    Sodium 134 (*)    Chloride 95 (*)    Glucose, Bld 367 (*)    BUN 24 (*)    GFR calc non Af Amer 74 (*)    GFR calc Af Amer 86 (*)    All other components within normal limits  URINE MICROSCOPIC-ADD ON  LIPASE, BLOOD   Imaging Review Dg Chest 2 View  05/30/2013   CLINICAL DATA:  Nausea, vomiting and fever.  EXAM: CHEST  2 VIEW  COMPARISON:  Chest x-ray 08/08/2010.  FINDINGS: Mild peribronchial cuffing throughout the right lung, predominantly in the right lower lobe. Left lung is clear. No consolidative airspace disease. No pleural effusions. No evidence of pulmonary edema. Heart size and mediastinal contours are within normal limits.  IMPRESSION: 1. Mild peribronchial cuffing predominantly in the right lower lobe may reflect an early bronchopneumonia or mild bronchitis.   Electronically Signed   By: Vinnie Langton M.D.   On: 05/30/2013 15:40    EKG  Interpretation   None       MDM   Final diagnoses:  CAP (community acquired pneumonia)    4:51 PM Patient's labs show hyperglycemia without other acute changes. Patient is tachycardic and febrile. Patient's chest xray shows pneumonia. Patient given fluids and toradol and zofran for symptoms. Patient will have Levaquin and zofran. Patient's glucose likely elevated due to pneumonia and vomiting.    Alvina Chou, PA-C 05/30/13 1658

## 2013-05-31 NOTE — ED Provider Notes (Signed)
Medical screening examination/treatment/procedure(s) were performed by non-physician practitioner and as supervising physician I was immediately available for consultation/collaboration.  EKG Interpretation   None         Houston Siren III, MD 05/31/13 1455

## 2013-06-13 ENCOUNTER — Ambulatory Visit (INDEPENDENT_AMBULATORY_CARE_PROVIDER_SITE_OTHER): Payer: BC Managed Care – PPO | Admitting: Family Medicine

## 2013-06-13 ENCOUNTER — Encounter: Payer: Self-pay | Admitting: Family Medicine

## 2013-06-13 ENCOUNTER — Ambulatory Visit (HOSPITAL_BASED_OUTPATIENT_CLINIC_OR_DEPARTMENT_OTHER)
Admission: RE | Admit: 2013-06-13 | Discharge: 2013-06-13 | Disposition: A | Discharge status: Home / Self Care | Payer: BC Managed Care – PPO | Source: Ambulatory Visit | Attending: Family Medicine | Admitting: Family Medicine

## 2013-06-13 VITALS — BP 132/66 | HR 100 | Resp 18 | Wt 275.0 lb

## 2013-06-13 DIAGNOSIS — E785 Hyperlipidemia, unspecified: Secondary | ICD-10-CM

## 2013-06-13 DIAGNOSIS — R079 Chest pain, unspecified: Secondary | ICD-10-CM | POA: Insufficient documentation

## 2013-06-13 DIAGNOSIS — E1159 Type 2 diabetes mellitus with other circulatory complications: Secondary | ICD-10-CM

## 2013-06-13 DIAGNOSIS — J189 Pneumonia, unspecified organism: Secondary | ICD-10-CM

## 2013-06-13 LAB — BASIC METABOLIC PANEL
BUN: 16 mg/dL (ref 6–23)
CO2: 24 mEq/L (ref 19–32)
Calcium: 10.4 mg/dL (ref 8.4–10.5)
Chloride: 104 mEq/L (ref 96–112)
Creatinine, Ser: 1 mg/dL (ref 0.4–1.5)
GFR: 104.42 mL/min (ref >60.00)
Glucose, Bld: 203 mg/dL — ABNORMAL HIGH (ref 70–99)
Potassium: 4 mEq/L (ref 3.5–5.1)
Sodium: 137 mEq/L (ref 135–145)

## 2013-06-13 LAB — MICROALBUMIN / CREATININE URINE RATIO
Creatinine,U: 109.2 mg/dL
Microalb Creat Ratio: 1.4 mg/g (ref 0.0–30.0)
Microalb, Ur: 1.5 mg/dL (ref 0.0–1.9)

## 2013-06-13 LAB — POCT URINALYSIS DIPSTICK
Bilirubin, UA: NEGATIVE
Blood, UA: NEGATIVE
Ketones, UA: NEGATIVE
Leukocytes, UA: NEGATIVE
Nitrite, UA: NEGATIVE
Protein, UA: NEGATIVE
Spec Grav, UA: 1.015
Urobilinogen, UA: 0.2
pH, UA: 6

## 2013-06-13 LAB — LIPID PANEL
Cholesterol: 160 mg/dL (ref 0–200)
HDL: 35.4 mg/dL — ABNORMAL LOW (ref >39.00)
Total CHOL/HDL Ratio: 5
Triglycerides: 265 mg/dL — ABNORMAL HIGH (ref 0.0–149.0)
VLDL: 53 mg/dL — ABNORMAL HIGH (ref 0.0–40.0)

## 2013-06-13 LAB — CBC WITH DIFFERENTIAL/PLATELET
Basophils Absolute: 0 10*3/uL (ref 0.0–0.1)
Basophils Relative: 0.7 % (ref 0.0–3.0)
Eosinophils Absolute: 0.1 10*3/uL (ref 0.0–0.7)
Eosinophils Relative: 1.4 % (ref 0.0–5.0)
HCT: 39.5 % (ref 39.0–52.0)
Hemoglobin: 12.9 g/dL — ABNORMAL LOW (ref 13.0–17.0)
Lymphocytes Relative: 34.4 % (ref 12.0–46.0)
Lymphs Abs: 1.7 10*3/uL (ref 0.7–4.0)
MCHC: 32.7 g/dL (ref 30.0–36.0)
MCV: 91.2 fl (ref 78.0–100.0)
Monocytes Absolute: 0.5 10*3/uL (ref 0.1–1.0)
Monocytes Relative: 10.9 % (ref 3.0–12.0)
Neutro Abs: 2.6 10*3/uL (ref 1.4–7.7)
Neutrophils Relative %: 52.6 % (ref 43.0–77.0)
Platelets: 354 10*3/uL (ref 150.0–400.0)
RBC: 4.34 Mil/uL (ref 4.22–5.81)
RDW: 13.6 % (ref 11.5–14.6)
WBC: 5 10*3/uL (ref 4.5–10.5)

## 2013-06-13 LAB — HEMOGLOBIN A1C: Hgb A1c MFr Bld: 11.3 % — ABNORMAL HIGH (ref 4.6–6.5)

## 2013-06-13 LAB — LDL CHOLESTEROL, DIRECT: Direct LDL: 83.7 mg/dL

## 2013-06-13 LAB — HEPATIC FUNCTION PANEL
ALT: 44 U/L (ref 0–53)
AST: 27 U/L (ref 0–37)
Albumin: 4 g/dL (ref 3.5–5.2)
Alkaline Phosphatase: 56 U/L (ref 39–117)
Bilirubin, Direct: 0 mg/dL (ref 0.0–0.3)
Total Bilirubin: 0.6 mg/dL (ref 0.3–1.2)
Total Protein: 7.7 g/dL (ref 6.0–8.3)

## 2013-06-13 MED ORDER — LEVOFLOXACIN 500 MG PO TABS: 500.0000 mg | ORAL_TABLET | Freq: Every day | ORAL | Status: DC

## 2013-06-13 NOTE — Progress Notes (Signed)
Pre visit review using our clinic review tool, if applicable. No additional management support is needed unless otherwise documented below in the visit note. 

## 2013-06-13 NOTE — Progress Notes (Addendum)
Subjective:     Alex Rocha is a 58 y.o. male here for evaluation of a cough. Onset of symptoms was 2 weeks ago. Symptoms have been gradually improving since that time since being put on abx in [place].   Pt dx with pneumonia.  See er visit.  The cough is no longer productive . Associated symptoms include: chills, fever and sinus pressure. Patient does not have a history of asthma. Patient does have a history of environmental allergens. Patient has not traveled recently. Patient does not have a history of smoking. Patient has had a previous chest x-ray. Patient has not had a PPD done.  Pt states pneumonia is much improved even though he only had 5 days of abx.    The following portions of the patient's history were reviewed and updated as appropriate:  He  has a past medical history of Hyperlipidemia; Hypertension; Diabetes mellitus; Environmental allergies; Kidney stones; Colon polyps; Low testosterone; and Hyperthyroidism. He  does not have any pertinent problems on file. He  has no past surgical history on file. His family history includes Colon cancer in his brother and mother; Diabetes in his mother and another family member; Heart disease in his mother and sister; Hyperlipidemia in his brother; Hypertension in his mother. He  reports that he has never smoked. He does not have any smokeless tobacco history on file. He reports that he drinks alcohol. He reports that he does not use illicit drugs. He has a current medication list which includes the following prescription(s): acetic acid, fenofibrate micronized, losartan, ondansetron, pravastatin, sitagliptin-metformin hcl, and levofloxacin. Current Outpatient Prescriptions on File Prior to Visit  Medication Sig Dispense Refill  . acetic acid (VOSOL) 2 % otic solution 5 drops in affected ear  Tid  15 mL  5  . Fenofibrate Micronized (ANTARA) 90 MG CAPS Take 1 capsule by mouth daily.  30 capsule  5  . losartan (COZAAR) 100 MG tablet Take 1 tablet (100  mg total) by mouth daily.  90 tablet  3  . ondansetron (ZOFRAN ODT) 4 MG disintegrating tablet Take 1 tablet (4 mg total) by mouth every 8 (eight) hours as needed for nausea or vomiting.  10 tablet  0  . pravastatin (PRAVACHOL) 20 MG tablet Take 1 tablet (20 mg total) by mouth daily.  30 tablet  5  . SitaGLIPtin-MetFORMIN HCl [phone] MG TB24 Take 1 tablet by mouth daily.  30 tablet  5   No current facility-administered medications on file prior to visit.   He has No Known Allergies..  Review of Systems Pertinent items are noted in HPI.    Objective:    Oxygen saturation 96% on room air BP 132/66  Pulse 100  Resp 18  Wt 275 lb (124.739 kg)  SpO2 96% General appearance: alert, cooperative, appears stated age and no distress Ears: normal TM's and external ear canals both ears Nose: green discharge, mild congestion, sinus tenderness bilateral Throat: lips, mucosa, and tongue normal; teeth and gums normal Neck: moderate anterior cervical adenopathy, supple, symmetrical, trachea midline and thyroid not enlarged, symmetric, no tenderness/mass/nodules Lungs: clear to auscultation bilaterally Heart: S1, S2 normal Extremities: extremities normal, atraumatic, no cyanosis or edema    Assessment:    Pneumonia and Sinusitis    Plan:    Antibiotics per medication orders. Avoid exposure to tobacco smoke and fumes. Call if shortness of breath worsens, blood in sputum, change in character of cough, development of fever or chills, inability to maintain nutrition and hydration. Avoid  exposure to tobacco smoke and fumes. Chest x-ray.   1. CAP (community acquired pneumonia) resolvng--finish abx - levofloxacin (LEVAQUIN) 500 MG tablet; Take 1 tablet (500 mg total) by mouth daily.  Dispense: 10 tablet; Refill: 0 - DG Chest 2 View; Future - Basic metabolic panel - CBC with Differential  2. Type II or unspecified type diabetes mellitus with peripheral circulatory disorders,  uncontrolled(250.72) Check labs - Basic metabolic panel - CBC with Differential - Hemoglobin A1c - Microalbumin / creatinine urine ratio - POCT urinalysis dipstick  3. Other and unspecified hyperlipidemia Check labs - Basic metabolic panel - CBC with Differential - Hepatic function panel - Lipid panel - Microalbumin / creatinine urine ratio - POCT urinalysis dipstick

## 2013-06-13 NOTE — Patient Instructions (Signed)

## 2013-06-15 ENCOUNTER — Other Ambulatory Visit: Payer: Self-pay | Admitting: Family Medicine

## 2013-06-15 DIAGNOSIS — IMO0002 Reserved for concepts with insufficient information to code with codable children: Secondary | ICD-10-CM

## 2013-06-15 DIAGNOSIS — E1165 Type 2 diabetes mellitus with hyperglycemia: Secondary | ICD-10-CM

## 2013-06-18 ENCOUNTER — Encounter: Payer: Self-pay | Admitting: Endocrinology

## 2013-06-18 ENCOUNTER — Ambulatory Visit (INDEPENDENT_AMBULATORY_CARE_PROVIDER_SITE_OTHER): Payer: BC Managed Care – PPO | Admitting: Endocrinology

## 2013-06-18 ENCOUNTER — Other Ambulatory Visit: Payer: Self-pay | Admitting: Endocrinology

## 2013-06-18 VITALS — BP 118/70 | HR 91 | Temp 98.7°F | Ht 74.0 in | Wt 269.0 lb

## 2013-06-18 DIAGNOSIS — E119 Type 2 diabetes mellitus without complications: Secondary | ICD-10-CM

## 2013-06-18 DIAGNOSIS — E042 Nontoxic multinodular goiter: Secondary | ICD-10-CM

## 2013-06-18 LAB — T4, FREE: Free T4: 3.07 ng/dL — ABNORMAL HIGH (ref 0.60–1.60)

## 2013-06-18 LAB — TSH: TSH: 0.05 u[IU]/mL — ABNORMAL LOW (ref 0.35–5.50)

## 2013-06-18 NOTE — Patient Instructions (Addendum)
blood tests are being requested for you today.  We'll contact you with results.   Please come back for a follow-up appointment in 2 months.   most of the time, a "lumpy thyroid" will eventually become overactive again.  this is usually a slow process, happening over the span of many years.

## 2013-06-18 NOTE — Progress Notes (Signed)
   Subjective:    Patient ID: Alex Rocha, male    DOB: 04-Aug-1955, 58 y.o.   MRN: [id number]  HPI In 2014, pt was found to have a multinodular goiter. bx was benign.  In November of 2014, pt had i-131 rx for associated hyperthyroidism.  pt states he feels no different, and well in general.   Past Medical History  Diagnosis Date  . Hyperlipidemia   . Hypertension   . Diabetes mellitus   . Environmental allergies   . Kidney stones   . Colon polyps   . Low testosterone   . Hyperthyroidism     No past surgical history on file.  History   Social History  . Marital Status: Married    Spouse Name: N/A    Number of Children: N/A  . Years of Education: N/A   Occupational History  . Not on file.   Social History Main Topics  . Smoking status: Never Smoker   . Smokeless tobacco: Not on file  . Alcohol Use: Yes  . Drug Use: No  . Sexual Activity: Not on file   Other Topics Concern  . Not on file   Social History Narrative  . No narrative on file    Current Outpatient Prescriptions on File Prior to Visit  Medication Sig Dispense Refill  . acetic acid (VOSOL) 2 % otic solution 5 drops in affected ear  Tid  15 mL  5  . Fenofibrate Micronized (ANTARA) 90 MG CAPS Take 1 capsule by mouth daily.  30 capsule  5  . levofloxacin (LEVAQUIN) 500 MG tablet Take 1 tablet (500 mg total) by mouth daily.  10 tablet  0  . losartan (COZAAR) 100 MG tablet Take 1 tablet (100 mg total) by mouth daily.  90 tablet  3  . ondansetron (ZOFRAN ODT) 4 MG disintegrating tablet Take 1 tablet (4 mg total) by mouth every 8 (eight) hours as needed for nausea or vomiting.  10 tablet  0  . pravastatin (PRAVACHOL) 20 MG tablet Take 1 tablet (20 mg total) by mouth daily.  30 tablet  5  . SitaGLIPtin-MetFORMIN HCl [phone] MG TB24 Take 1 tablet by mouth daily.  30 tablet  5   No current facility-administered medications on file prior to visit.    No Known Allergies  Family History  Problem Relation Age  of Onset  . Colon cancer Mother   . Colon cancer Brother   . Hyperlipidemia Brother     Mother's side of the family  . Heart disease Sister   . Heart disease Mother   . Hypertension Mother     Mother's side of the family  . Diabetes Mother     Mother's side of the family  . Diabetes      father's family    BP 118/70  Pulse 91  Temp(Src) 98.7 F (37.1 C) (Oral)  Ht 6\' 2"  (1.88 m)  Wt 269 lb (122.018 kg)  BMI 34.52 kg/m2  SpO2 95%  Review of Systems Denies weight change    Objective:   Physical Exam VITAL SIGNS:  See vs page. GENERAL: no distress. Neck: small multinodular goiter.  Lab Results  Component Value Date   TSH 0.05* 06/18/2013      Assessment & Plan:  Hyperthyroidism: recurrent after I-131 rx.

## 2013-06-19 ENCOUNTER — Encounter: Payer: Self-pay | Admitting: *Deleted

## 2013-06-19 ENCOUNTER — Telehealth: Payer: Self-pay

## 2013-06-19 DIAGNOSIS — E0590 Thyrotoxicosis, unspecified without thyrotoxic crisis or storm: Secondary | ICD-10-CM

## 2013-06-19 NOTE — Telephone Encounter (Signed)
done

## 2013-06-19 NOTE — Telephone Encounter (Signed)
Stanton Kidney called stating that orders for up-taking scan were put in wrong. She states that since all that is being requested is a scan the order does not need to include imaging. She states that the order needs to be [id number] up-taking single w/t imaging.  Please advise, Thanks!

## 2013-06-19 NOTE — Progress Notes (Signed)
Letter sent to patient with lab results.

## 2013-06-25 ENCOUNTER — Ambulatory Visit (HOSPITAL_COMMUNITY)
Admission: RE | Admit: 2013-06-25 | Discharge: 2013-06-25 | Disposition: A | Discharge status: Home / Self Care | Payer: BC Managed Care – PPO | Source: Ambulatory Visit | Attending: Endocrinology | Admitting: Endocrinology

## 2013-06-25 DIAGNOSIS — E0590 Thyrotoxicosis, unspecified without thyrotoxic crisis or storm: Secondary | ICD-10-CM

## 2013-06-26 ENCOUNTER — Encounter (HOSPITAL_COMMUNITY)
Admission: RE | Admit: 2013-06-26 | Discharge: 2013-06-26 | Disposition: A | Discharge status: Home / Self Care | Payer: BC Managed Care – PPO | Source: Ambulatory Visit | Attending: Endocrinology | Admitting: Endocrinology

## 2013-06-26 ENCOUNTER — Other Ambulatory Visit: Payer: Self-pay | Admitting: Endocrinology

## 2013-06-26 ENCOUNTER — Other Ambulatory Visit (INDEPENDENT_AMBULATORY_CARE_PROVIDER_SITE_OTHER): Payer: BC Managed Care – PPO

## 2013-06-26 DIAGNOSIS — E0590 Thyrotoxicosis, unspecified without thyrotoxic crisis or storm: Secondary | ICD-10-CM | POA: Insufficient documentation

## 2013-06-26 DIAGNOSIS — R319 Hematuria, unspecified: Secondary | ICD-10-CM

## 2013-06-26 MED ORDER — SODIUM IODIDE I 131 CAPSULE
15.8000 | Freq: Once | INTRAVENOUS | Status: AC | PRN
  Administered 2013-06-25: 15.8 via ORAL

## 2013-06-27 ENCOUNTER — Telehealth: Payer: Self-pay | Admitting: Endocrinology

## 2013-06-27 LAB — T4, FREE: Free T4: 2.55 ng/dL — ABNORMAL HIGH (ref 0.60–1.60)

## 2013-06-27 LAB — TSH: TSH: 0.05 u[IU]/mL — ABNORMAL LOW (ref 0.35–5.50)

## 2013-06-28 LAB — URINE CULTURE
Colony Count: NO GROWTH
Organism ID, Bacteria: NO GROWTH

## 2013-08-18 ENCOUNTER — Ambulatory Visit: Payer: BC Managed Care – PPO | Admitting: Endocrinology

## 2013-08-25 ENCOUNTER — Encounter: Payer: Self-pay | Admitting: Physician Assistant

## 2013-08-25 ENCOUNTER — Ambulatory Visit (INDEPENDENT_AMBULATORY_CARE_PROVIDER_SITE_OTHER): Payer: BC Managed Care – PPO | Admitting: Physician Assistant

## 2013-08-25 ENCOUNTER — Ambulatory Visit (INDEPENDENT_AMBULATORY_CARE_PROVIDER_SITE_OTHER): Payer: BC Managed Care – PPO | Admitting: Endocrinology

## 2013-08-25 ENCOUNTER — Encounter: Payer: Self-pay | Admitting: Endocrinology

## 2013-08-25 VITALS — BP 122/60 | HR 84 | Temp 99.0°F | Ht 74.0 in | Wt 272.0 lb

## 2013-08-25 VITALS — BP 125/75 | HR 92 | Temp 98.7°F | Resp 16 | Ht 74.0 in | Wt 271.5 lb

## 2013-08-25 DIAGNOSIS — E119 Type 2 diabetes mellitus without complications: Secondary | ICD-10-CM

## 2013-08-25 DIAGNOSIS — E0590 Thyrotoxicosis, unspecified without thyrotoxic crisis or storm: Secondary | ICD-10-CM

## 2013-08-25 DIAGNOSIS — J329 Chronic sinusitis, unspecified: Secondary | ICD-10-CM | POA: Insufficient documentation

## 2013-08-25 LAB — T4, FREE: Free T4: 0.33 ng/dL — ABNORMAL LOW (ref 0.60–1.60)

## 2013-08-25 LAB — TSH: TSH: 5.04 u[IU]/mL — ABNORMAL HIGH (ref 0.35–4.50)

## 2013-08-25 MED ORDER — LEVOTHYROXINE SODIUM 100 MCG PO TABS: 100.0000 ug | ORAL_TABLET | Freq: Every day | ORAL | Status: DC

## 2013-08-25 MED ORDER — INSULIN DETEMIR 100 UNIT/ML FLEXPEN: 50.0000 [IU] | PEN_INJECTOR | SUBCUTANEOUS | Status: DC

## 2013-08-25 MED ORDER — AZITHROMYCIN 250 MG PO TABS
ORAL_TABLET | ORAL | Status: DC
Start: 2013-08-25 — End: 2013-12-08

## 2013-08-25 NOTE — Progress Notes (Signed)
Patient presents to clinic today c/o sinus pressure, sinus pain, ear pain and cough x 1.5 - 2 weeks.  Denies fever, chills, SOB or wheezing. Denies recent travel or sick contact.    Past Medical History  Diagnosis Date  . Hyperlipidemia   . Hypertension   . Diabetes mellitus   . Environmental allergies   . Kidney stones   . Colon polyps   . Low testosterone   . Hyperthyroidism     Current Outpatient Prescriptions on File Prior to Visit  Medication Sig Dispense Refill  . acetic acid (VOSOL) 2 % otic solution 5 drops in affected ear  Tid  15 mL  5  . Fenofibrate Micronized (ANTARA) 90 MG CAPS Take 1 capsule by mouth daily.  30 capsule  5  . Insulin Detemir (LEVEMIR FLEXTOUCH) 100 UNIT/ML Pen Inject 50 Units into the skin every morning. And pen needles 1/day  30 mL  11  . losartan (COZAAR) 100 MG tablet Take 1 tablet (100 mg total) by mouth daily.  90 tablet  3  . pravastatin (PRAVACHOL) 20 MG tablet Take 1 tablet (20 mg total) by mouth daily.  30 tablet  5  . SitaGLIPtin-MetFORMIN HCl [phone] MG TB24 Take 1 tablet by mouth daily.  30 tablet  5  . levothyroxine (SYNTHROID, LEVOTHROID) 100 MCG tablet Take 1 tablet (100 mcg total) by mouth daily before breakfast.  30 tablet  2   No current facility-administered medications on file prior to visit.    No Known Allergies  Family History  Problem Relation Age of Onset  . Colon cancer Mother   . Colon cancer Brother   . Hyperlipidemia Brother     Mother's side of the family  . Heart disease Sister   . Heart disease Mother   . Hypertension Mother     Mother's side of the family  . Diabetes Mother     Mother's side of the family  . Diabetes      father's family    History   Social History  . Marital Status: Married    Spouse Name: N/A    Number of Children: N/A  . Years of Education: N/A   Social History Main Topics  . Smoking status: Never Smoker   . Smokeless tobacco: None  . Alcohol Use: Yes  . Drug Use: No  . Sexual  Activity: None   Other Topics Concern  . None   Social History Narrative  . None   Review of Systems - See HPI.  All other ROS are negative.  BP 125/75  Pulse 92  Temp(Src) 98.7 F (37.1 C) (Oral)  Resp 16  Ht $R'6\' 2"'tw$  (1.88 m)  Wt 271 lb 8 oz (123.152 kg)  BMI 34.84 kg/m2  SpO2 98%  Physical Exam  Vitals reviewed. Constitutional: He is oriented to person, place, and time and well-developed, well-nourished, and in no distress.  HENT:  Head: Normocephalic and atraumatic.  Right Ear: External ear normal.  Left Ear: External ear normal.  Nose: Nose normal.  Mouth/Throat: Oropharynx is clear and moist. No oropharyngeal exudate.  TM within normal limits bilaterally.   + TTP of sinuses noted on examination.  Eyes: Conjunctivae are normal. Pupils are equal, round, and reactive to light.  Neck: Neck supple.  Cardiovascular: Normal rate, regular rhythm, normal heart sounds and intact distal pulses.   Pulmonary/Chest: Effort normal and breath sounds normal. No respiratory distress. He has no wheezes. He has no rales. He exhibits no tenderness.  Lymphadenopathy:    He has no cervical adenopathy.  Neurological: He is alert and oriented to person, place, and time.  Skin: Skin is warm and dry. No rash noted.  Psychiatric: Affect normal.    Recent Results (from the past 2160 hour(s))  URINALYSIS, ROUTINE W REFLEX MICROSCOPIC     Status: Abnormal   Collection Time    05/30/13  1:38 PM      Result Value Ref Range   Color, Urine YELLOW  YELLOW   APPearance CLEAR  CLEAR   Specific Gravity, Urine 1.030  1.005 - 1.030   pH 6.0  5.0 - 8.0   Glucose, UA >1000 (*) NEGATIVE mg/dL   Hgb urine dipstick NEGATIVE  NEGATIVE   Bilirubin Urine NEGATIVE  NEGATIVE   Ketones, ur NEGATIVE  NEGATIVE mg/dL   Protein, ur NEGATIVE  NEGATIVE mg/dL   Urobilinogen, UA 1.0  0.0 - 1.0 mg/dL   Nitrite NEGATIVE  NEGATIVE   Leukocytes, UA NEGATIVE  NEGATIVE  URINE MICROSCOPIC-ADD ON     Status: None    Collection Time    05/30/13  1:38 PM      Result Value Ref Range   Squamous Epithelial / LPF RARE  RARE   WBC, UA 0-2  <3 WBC/hpf   RBC / HPF 0-2  <3 RBC/hpf   Bacteria, UA RARE  RARE  CBC WITH DIFFERENTIAL     Status: Abnormal   Collection Time    05/30/13  2:25 PM      Result Value Ref Range   WBC 3.4 (*) 4.0 - 10.5 K/uL   RBC 4.17 (*) 4.22 - 5.81 MIL/uL   Hemoglobin 12.9 (*) 13.0 - 17.0 g/dL   HCT 37.6 (*) 39.0 - 52.0 %   MCV 90.2  78.0 - 100.0 fL   MCH 30.9  26.0 - 34.0 pg   MCHC 34.3  30.0 - 36.0 g/dL   RDW 12.7  11.5 - 15.5 %   Platelets 185  150 - 400 K/uL   Neutrophils Relative % 80 (*) 43 - 77 %   Neutro Abs 2.7  1.7 - 7.7 K/uL   Lymphocytes Relative 10 (*) 12 - 46 %   Lymphs Abs 0.3 (*) 0.7 - 4.0 K/uL   Monocytes Relative 9  3 - 12 %   Monocytes Absolute 0.3  0.1 - 1.0 K/uL   Eosinophils Relative 1  0 - 5 %   Eosinophils Absolute 0.0  0.0 - 0.7 K/uL   Basophils Relative 0  0 - 1 %   Basophils Absolute 0.0  0.0 - 0.1 K/uL  COMPREHENSIVE METABOLIC PANEL     Status: Abnormal   Collection Time    05/30/13  2:25 PM      Result Value Ref Range   Sodium 134 (*) 137 - 147 mEq/L   Potassium 3.9  3.7 - 5.3 mEq/L   Chloride 95 (*) 96 - 112 mEq/L   CO2 21  19 - 32 mEq/L   Glucose, Bld 367 (*) 70 - 99 mg/dL   BUN 24 (*) 6 - 23 mg/dL   Creatinine, Ser 1.10  0.50 - 1.35 mg/dL   Calcium 8.5  8.4 - 10.5 mg/dL   Total Protein 6.8  6.0 - 8.3 g/dL   Albumin 3.5  3.5 - 5.2 g/dL   AST 30  0 - 37 U/L   ALT 38  0 - 53 U/L   Alkaline Phosphatase 46  39 - 117 U/L   Total  Bilirubin 0.5  0.3 - 1.2 mg/dL   GFR calc non Af Amer 74 (*) >90 mL/min   GFR calc Af Amer 86 (*) >90 mL/min   Comment: (NOTE)     The eGFR has been calculated using the CKD EPI equation.     This calculation has not been validated in all clinical situations.     eGFR's persistently <90 mL/min signify possible Chronic Kidney     Disease.  LIPASE, BLOOD     Status: None   Collection Time    05/30/13  2:25 PM       Result Value Ref Range   Lipase 44  11 - 59 U/L  MICROALBUMIN / CREATININE URINE RATIO     Status: None   Collection Time    06/13/13  3:20 PM      Result Value Ref Range   Microalb, Ur 1.5  0.0 - 1.9 mg/dL   Creatinine,U 109.2     Microalb Creat Ratio 1.4  0.0 - 30.0 mg/g  BASIC METABOLIC PANEL     Status: Abnormal   Collection Time    06/13/13  3:20 PM      Result Value Ref Range   Sodium 137  135 - 145 mEq/L   Potassium 4.0  3.5 - 5.1 mEq/L   Chloride 104  96 - 112 mEq/L   CO2 24  19 - 32 mEq/L   Glucose, Bld 203 (*) 70 - 99 mg/dL   BUN 16  6 - 23 mg/dL   Creatinine, Ser 1.0  0.4 - 1.5 mg/dL   Calcium 10.4  8.4 - 10.5 mg/dL   GFR 104.42  >60.00 mL/min  CBC WITH DIFFERENTIAL     Status: Abnormal   Collection Time    06/13/13  3:20 PM      Result Value Ref Range   WBC 5.0  4.5 - 10.5 K/uL   RBC 4.34  4.22 - 5.81 Mil/uL   Hemoglobin 12.9 (*) 13.0 - 17.0 g/dL   HCT 39.5  39.0 - 52.0 %   MCV 91.2  78.0 - 100.0 fl   MCHC 32.7  30.0 - 36.0 g/dL   RDW 13.6  11.5 - 14.6 %   Platelets 354.0  150.0 - 400.0 K/uL   Neutrophils Relative % 52.6  43.0 - 77.0 %   Lymphocytes Relative 34.4  12.0 - 46.0 %   Monocytes Relative 10.9  3.0 - 12.0 %   Eosinophils Relative 1.4  0.0 - 5.0 %   Basophils Relative 0.7  0.0 - 3.0 %   Neutro Abs 2.6  1.4 - 7.7 K/uL   Lymphs Abs 1.7  0.7 - 4.0 K/uL   Monocytes Absolute 0.5  0.1 - 1.0 K/uL   Eosinophils Absolute 0.1  0.0 - 0.7 K/uL   Basophils Absolute 0.0  0.0 - 0.1 K/uL  HEMOGLOBIN A1C     Status: Abnormal   Collection Time    06/13/13  3:20 PM      Result Value Ref Range   Hemoglobin A1C 11.3 (*) 4.6 - 6.5 %   Comment: Glycemic Control Guidelines for People with Diabetes:Non Diabetic:  <6%Goal of Therapy: <7%Additional Action Suggested:  >8%   HEPATIC FUNCTION PANEL     Status: None   Collection Time    06/13/13  3:20 PM      Result Value Ref Range   Total Bilirubin 0.6  0.3 - 1.2 mg/dL   Bilirubin, Direct 0.0  0.0 - 0.3 mg/dL  Alkaline Phosphatase 56  39 - 117 U/L   AST 27  0 - 37 U/L   ALT 44  0 - 53 U/L   Total Protein 7.7  6.0 - 8.3 g/dL   Albumin 4.0  3.5 - 5.2 g/dL  LIPID PANEL     Status: Abnormal   Collection Time    06/13/13  3:20 PM      Result Value Ref Range   Cholesterol 160  0 - 200 mg/dL   Comment: ATP III Classification       Desirable:  < 200 mg/dL               Borderline High:  200 - 239 mg/dL          High:  > = 240 mg/dL   Triglycerides 265.0 (*) 0.0 - 149.0 mg/dL   Comment: Normal:  <150 mg/dLBorderline High:  150 - 199 mg/dL   HDL 35.40 (*) >39.00 mg/dL   VLDL 53.0 (*) 0.0 - 40.0 mg/dL   Total CHOL/HDL Ratio 5     Comment:                Men          Women1/2 Average Risk     3.4          3.3Average Risk          5.0          4.42X Average Risk          9.6          7.13X Average Risk          15.0          11.0                      LDL CHOLESTEROL, DIRECT     Status: None   Collection Time    06/13/13  3:20 PM      Result Value Ref Range   Direct LDL 83.7     Comment: Optimal:  <100 mg/dLNear or Above Optimal:  100-129 mg/dLBorderline High:  130-159 mg/dLHigh:  160-189 mg/dLVery High:  >190 mg/dL  POCT URINALYSIS DIPSTICK     Status: None   Collection Time    06/13/13  3:45 PM      Result Value Ref Range   Color, UA yellow     Clarity, UA clear     Glucose, UA 1000+     Bilirubin, UA Neg     Ketones, UA Neg     Spec Grav, UA 1.015     Blood, UA Neg     pH, UA 6.0     Protein, UA Neg     Urobilinogen, UA 0.2     Nitrite, UA Neg     Leukocytes, UA Negative    TSH     Status: Abnormal   Collection Time    06/18/13  9:27 AM      Result Value Ref Range   TSH 0.05 (*) 0.35 - 5.50 uIU/mL  T4, FREE     Status: Abnormal   Collection Time    06/18/13  9:27 AM      Result Value Ref Range   Free T4 3.07 (*) 0.60 - 1.60 ng/dL  T4, FREE     Status: Abnormal   Collection Time    06/26/13  4:49 PM      Result Value Ref Range   Free T4 2.55 (*)  0.60 - 1.60 ng/dL  TSH     Status:  Abnormal   Collection Time    06/26/13  4:49 PM      Result Value Ref Range   TSH 0.05 (*) 0.35 - 5.50 uIU/mL  URINE CULTURE     Status: None   Collection Time    06/26/13  4:55 PM      Result Value Ref Range   Colony Count NO GROWTH     Organism ID, Bacteria NO GROWTH    T4, FREE     Status: Abnormal   Collection Time    08/25/13  8:46 AM      Result Value Ref Range   Free T4 0.33 (*) 0.60 - 1.60 ng/dL  TSH     Status: Abnormal   Collection Time    08/25/13  8:46 AM      Result Value Ref Range   TSH 5.04 (*) 0.35 - 4.50 uIU/mL   Assessment/Plan: Sinusitis Rx azithromycin.  Increase fluids.  Rest.  Saline nasal spray.  Delsym for cough.  Plain Mucinex for congestion.  Humidifier in bedroom.  Call or return to clinic if symptoms are not improving.

## 2013-08-25 NOTE — Patient Instructions (Addendum)
good diet and exercise habits significanly improve the control of your diabetes.  please let me know if you wish to be referred to a dietician.  high blood sugar is very risky to your health.  you should see an eye doctor every year.  You are at higher than average risk for pneumonia and hepatitis-B.  You should be vaccinated against both.   controlling your blood pressure and cholesterol drastically reduces the damage diabetes does to your body.  this also applies to quitting smoking.  please discuss these with your doctor.  check your blood sugar twice a day.  vary the time of day when you check, between before the 3 meals, and at bedtime.  also check if you have symptoms of your blood sugar being too high or too low.  please keep a record of the readings and bring it to your next appointment here.  You can write it on any piece of paper.  please call us sooner if your blood sugar goes below 70, or if you have a lot of readings over 200.   Please resume the insulin.  i have sent a prescription to your pharmacy. blood tests are being requested for you today.  We'll contact you with results. Please come back for a follow-up appointment in 1 month.

## 2013-08-25 NOTE — Progress Notes (Signed)
Subjective:    Patient ID: Alex Rocha, male    DOB: 15-Jan-1956, 58 y.o.   MRN: [id number]  HPI pt has insulin-requiring DM (dx'ed 2000; he has mild if any neuropathy of the lower extremities; no associated chronic complications; he took insulin from early 2014-early 2015; he says he then stopped refilling it; he says his need to care for his wife, who has cancer, limits his ability to care for himself.   pt says his diet and exercise are poor.  He has never had pancreatitis, severe hypoglycemia or DKA).   In 2014, pt was found to have a multinodular goiter. bx was benign.  In November of 2014, pt had i-131 rx for associated hyperthyroidism.  pt states he feels no different, and well in general.  Past Medical History  Diagnosis Date  . Hyperlipidemia   . Hypertension   . Diabetes mellitus   . Environmental allergies   . Kidney stones   . Colon polyps   . Low testosterone   . Hyperthyroidism     No past surgical history on file.  History   Social History  . Marital Status: Married    Spouse Name: N/A    Number of Children: N/A  . Years of Education: N/A   Occupational History  . Not on file.   Social History Main Topics  . Smoking status: Never Smoker   . Smokeless tobacco: Not on file  . Alcohol Use: Yes  . Drug Use: No  . Sexual Activity: Not on file   Other Topics Concern  . Not on file   Social History Narrative  . No narrative on file    Current Outpatient Prescriptions on File Prior to Visit  Medication Sig Dispense Refill  . acetic acid (VOSOL) 2 % otic solution 5 drops in affected ear  Tid  15 mL  5  . Fenofibrate Micronized (ANTARA) 90 MG CAPS Take 1 capsule by mouth daily.  30 capsule  5  . levofloxacin (LEVAQUIN) 500 MG tablet Take 1 tablet (500 mg total) by mouth daily.  10 tablet  0  . losartan (COZAAR) 100 MG tablet Take 1 tablet (100 mg total) by mouth daily.  90 tablet  3  . ondansetron (ZOFRAN ODT) 4 MG disintegrating tablet Take 1 tablet (4 mg  total) by mouth every 8 (eight) hours as needed for nausea or vomiting.  10 tablet  0  . pravastatin (PRAVACHOL) 20 MG tablet Take 1 tablet (20 mg total) by mouth daily.  30 tablet  5  . SitaGLIPtin-MetFORMIN HCl [phone] MG TB24 Take 1 tablet by mouth daily.  30 tablet  5   No current facility-administered medications on file prior to visit.    No Known Allergies  Family History  Problem Relation Age of Onset  . Colon cancer Mother   . Colon cancer Brother   . Hyperlipidemia Brother     Mother's side of the family  . Heart disease Sister   . Heart disease Mother   . Hypertension Mother     Mother's side of the family  . Diabetes Mother     Mother's side of the family  . Diabetes      father's family   There were no vitals taken for this visit.  Review of Systems denies weight loss, blurry vision, chest pain, sob, n/v, urinary frequency, cramps, excessive diaphoresis, memory loss, depression, cold intolerance, rhinorrhea, and easy bruising.  He has headache.  Objective:   Physical Exam VS: see vs page GEN: no distress HEAD: head: no deformity eyes: no periorbital swelling, no proptosis external nose and ears are normal mouth: no lesion seen NECK: supple, thyroid is not enlarged CHEST WALL: no deformity LUNGS: clear to auscultation BREASTS:  No gynecomastia CV: reg rate and rhythm, no murmur ABD: abdomen is soft, nontender.  no hepatosplenomegaly.  not distended.  no hernia MUSCULOSKELETAL: muscle bulk and strength are grossly normal.  no obvious joint swelling.  gait is normal and steady PULSES: no carotid bruit NEURO:  cn 2-12 grossly intact.   readily moves all 4's. SKIN:  Normal texture and temperature.  No rash or suspicious lesion is visible.   NODES:  None palpable at the neck PSYCH: alert, well-oriented.  Does not appear anxious nor depressed.  Lab Results  Component Value Date   HGBA1C 11.3* 06/13/2013   Lab Results  Component Value Date   TSH 5.04*  08/25/2013      Assessment & Plan:  DM: poor control Noncompliance with insulin: this causes very high risk to his health. 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 Domestic situation: this complicates the rx of DM

## 2013-08-25 NOTE — Progress Notes (Signed)
Pre visit review using our clinic review tool, if applicable. No additional management support is needed unless otherwise documented below in the visit note/SLS  

## 2013-08-25 NOTE — Patient Instructions (Signed)
Please take antibiotic as directed.  Increase fluid intake.  Use Saline nasal spray.  Take a daily multivitamin. Tylenol of Motrin for headache.  Place a humidifier in the bedroom.  Please call or return clinic if symptoms are not improving.  Sinusitis Sinusitis is redness, soreness, and swelling (inflammation) of the paranasal sinuses. Paranasal sinuses are air pockets within the bones of your face (beneath the eyes, the middle of the forehead, or above the eyes). In healthy paranasal sinuses, mucus is able to drain out, and air is able to circulate through them by way of your nose. However, when your paranasal sinuses are inflamed, mucus and air can become trapped. This can allow bacteria and other germs to grow and cause infection. Sinusitis can develop quickly and last only a short time (acute) or continue over a long period (chronic). Sinusitis that lasts for more than 12 weeks is considered chronic.  CAUSES  Causes of sinusitis include:  Allergies.  Structural abnormalities, such as displacement of the cartilage that separates your nostrils (deviated septum), which can decrease the air flow through your nose and sinuses and affect sinus drainage.  Functional abnormalities, such as when the small hairs (cilia) that line your sinuses and help remove mucus do not work properly or are not present. SYMPTOMS  Symptoms of acute and chronic sinusitis are the same. The primary symptoms are pain and pressure around the affected sinuses. Other symptoms include:  Upper toothache.  Earache.  Headache.  Bad breath.  Decreased sense of smell and taste.  A cough, which worsens when you are lying flat.  Fatigue.  Fever.  Thick drainage from your nose, which often is green and may contain pus (purulent).  Swelling and warmth over the affected sinuses. DIAGNOSIS  Your caregiver will perform a physical exam. During the exam, your caregiver may:  Look in your nose for signs of abnormal growths  in your nostrils (nasal polyps).  Tap over the affected sinus to check for signs of infection.  View the inside of your sinuses (endoscopy) with a special imaging device with a light attached (endoscope), which is inserted into your sinuses. If your caregiver suspects that you have chronic sinusitis, one or more of the following tests may be recommended:  Allergy tests.  Nasal culture A sample of mucus is taken from your nose and sent to a lab and screened for bacteria.  Nasal cytology A sample of mucus is taken from your nose and examined by your caregiver to determine if your sinusitis is related to an allergy. TREATMENT  Most cases of acute sinusitis are related to a viral infection and will resolve on their own within 10 days. Sometimes medicines are prescribed to help relieve symptoms (pain medicine, decongestants, nasal steroid sprays, or saline sprays).  However, for sinusitis related to a bacterial infection, your caregiver will prescribe antibiotic medicines. These are medicines that will help kill the bacteria causing the infection.  Rarely, sinusitis is caused by a fungal infection. In theses cases, your caregiver will prescribe antifungal medicine. For some cases of chronic sinusitis, surgery is needed. Generally, these are cases in which sinusitis recurs more than 3 times per year, despite other treatments. HOME CARE INSTRUCTIONS   Drink plenty of water. Water helps thin the mucus so your sinuses can drain more easily.  Use a humidifier.  Inhale steam 3 to 4 times a day (for example, sit in the bathroom with the shower running).  Apply a warm, moist washcloth to your face 3  to 4 times a day, or as directed by your caregiver.  Use saline nasal sprays to help moisten and clean your sinuses.  Take over-the-counter or prescription medicines for pain, discomfort, or fever only as directed by your caregiver. SEEK IMMEDIATE MEDICAL CARE IF:  You have increasing pain or severe  headaches.  You have nausea, vomiting, or drowsiness.  You have swelling around your face.  You have vision problems.  You have a stiff neck.  You have difficulty breathing. MAKE SURE YOU:   Understand these instructions.  Will watch your condition.  Will get help right away if you are not doing well or get worse. Document Released: 04/03/2005 Document Revised: 06/26/2011 Document Reviewed: 04/18/2011 [facility] Patient Information 2014 [place], [place].

## 2013-08-25 NOTE — Assessment & Plan Note (Signed)
Rx azithromycin.  Increase fluids.  Rest.  Saline nasal spray.  Delsym for cough.  Plain Mucinex for congestion.  Humidifier in bedroom.  Call or return to clinic if symptoms are not improving.

## 2013-08-28 ENCOUNTER — Other Ambulatory Visit: Payer: Self-pay | Admitting: Family Medicine

## 2013-09-28 ENCOUNTER — Other Ambulatory Visit: Payer: Self-pay | Admitting: Family Medicine

## 2013-11-14 ENCOUNTER — Telehealth: Payer: Self-pay

## 2013-11-14 NOTE — Telephone Encounter (Signed)
Diabetic Bundle. pt schedule with Dr. Loanne Drilling for 12/08/2013.

## 2013-11-30 ENCOUNTER — Other Ambulatory Visit: Payer: Self-pay | Admitting: Endocrinology

## 2013-12-08 ENCOUNTER — Encounter: Payer: Self-pay | Admitting: Endocrinology

## 2013-12-08 ENCOUNTER — Ambulatory Visit (INDEPENDENT_AMBULATORY_CARE_PROVIDER_SITE_OTHER): Payer: BC Managed Care – PPO | Admitting: Endocrinology

## 2013-12-08 VITALS — BP 118/66 | HR 81 | Temp 98.6°F | Ht 74.0 in | Wt 274.0 lb

## 2013-12-08 DIAGNOSIS — E890 Postprocedural hypothyroidism: Secondary | ICD-10-CM | POA: Insufficient documentation

## 2013-12-08 DIAGNOSIS — E119 Type 2 diabetes mellitus without complications: Secondary | ICD-10-CM

## 2013-12-08 LAB — TSH: TSH: 2.69 u[IU]/mL (ref 0.35–4.50)

## 2013-12-08 LAB — HEMOGLOBIN A1C: Hgb A1c MFr Bld: 12.1 % — ABNORMAL HIGH (ref 4.6–6.5)

## 2013-12-08 NOTE — Patient Instructions (Addendum)
check your blood sugar twice a day.  vary the time of day when you check, between before the 3 meals, and at bedtime.  also check if you have symptoms of your blood sugar being too high or too low.  please keep a record of the readings and bring it to your next appointment here.  You can write it on any piece of paper.  please call us sooner if your blood sugar goes below 70, or if you have a lot of readings over 200. blood tests are being requested for you today.  We'll contact you with results. Please come back for a follow-up appointment in 3 months.   

## 2013-12-08 NOTE — Progress Notes (Signed)
Subjective:    Patient ID: Alex Rocha, male    DOB: 1956/02/14, 58 y.o.   MRN: [id number]  HPI pt has insulin-requiring DM (dx'ed 2000; no known chronic complications; he took insulin from early 2014-early 2015; he says he then stopped refilling it; he says his need to care for his wife, who has cancer, limits his ability to care for himself; he has never had pancreatitis, severe hypoglycemia or DKA; he declines weight-loss surgery; he chose a simple qd insulin regimen).  no cbg record, but states cbg's vary from 80-200's.  There is no trend throughout the day.  He says he misses the insulin approx twice a month.  He says he cannot afford the janumet.    In 2014, pt was found to have a multinodular goiter. bx was benign.  In November of 2014, pt had i-131 rx for associated hyperthyroidism.  He takes synthroid as rx'ed.  pt states he feels well in general.   Past Medical History  Diagnosis Date  . Hyperlipidemia   . Hypertension   . Diabetes mellitus   . Environmental allergies   . Kidney stones   . Colon polyps   . Low testosterone   . Hyperthyroidism     No past surgical history on file.  History   Social History  . Marital Status: Married    Spouse Name: N/A    Number of Children: N/A  . Years of Education: N/A   Occupational History  . Not on file.   Social History Main Topics  . Smoking status: Never Smoker   . Smokeless tobacco: Not on file  . Alcohol Use: Yes  . Drug Use: No  . Sexual Activity: Not on file   Other Topics Concern  . Not on file   Social History Narrative  . No narrative on file    Current Outpatient Prescriptions on File Prior to Visit  Medication Sig Dispense Refill  . acetic acid (VOSOL) 2 % otic solution 5 drops in affected ear  Tid  15 mL  5  . ANTARA 90 MG CAPS TAKE 1 CAPSULE BY MOUTH DAILY.  30 capsule  5  . levothyroxine (SYNTHROID, LEVOTHROID) 100 MCG tablet TAKE 1 TABLET (100 MCG TOTAL) BY MOUTH DAILY BEFORE BREAKFAST.  30 tablet   2  . losartan (COZAAR) 100 MG tablet Take 1 tablet (100 mg total) by mouth daily.  90 tablet  3  . pravastatin (PRAVACHOL) 20 MG tablet TAKE 1 TABLET (20 MG TOTAL) BY MOUTH DAILY.  30 tablet  5   No current facility-administered medications on file prior to visit.    No Known Allergies  Family History  Problem Relation Age of Onset  . Colon cancer Mother   . Colon cancer Brother   . Hyperlipidemia Brother     Mother's side of the family  . Heart disease Sister   . Heart disease Mother   . Hypertension Mother     Mother's side of the family  . Diabetes Mother     Mother's side of the family  . Diabetes      father's family    BP 118/66  Pulse 81  Temp(Src) 98.6 F (37 C) (Oral)  Ht 6\' 2"  (1.88 m)  Wt 274 lb (124.286 kg)  BMI 35.16 kg/m2  SpO2 95%  Review of Systems He denies hypoglycemia and weight change.      Objective:   Physical Exam VITAL SIGNS:  See vs page GENERAL: no distress NECK:  There is no palpable thyroid enlargement.  No thyroid nodule is palpable.  No palpable lymphadenopathy at the anterior neck. Pulses: dorsalis pedis intact bilat.   Feet: no deformity. normal color and temp.  no edema.  There is bilateral onychomycosis Skin:  no ulcer on the feet.   Neuro: sensation is intact to touch on the feet  Lab Results  Component Value Date   HGBA1C 12.1* 12/08/2013   Lab Results  Component Value Date   TSH 2.69 12/08/2013      Assessment & Plan:  DM: severe exacerbation. 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 Multinodular goiter: clinically stable.     Patient is advised the following: Patient Instructions  check your blood sugar twice a day.  vary the time of day when you check, between before the 3 meals, and at bedtime.  also check if you have symptoms of your blood sugar being too high or too low.  please keep a record of the readings and bring it to your next appointment here.  You can write it on any piece of paper.  please call us  sooner if your blood sugar goes below 70, or if you have a lot of readings over 200.   blood tests are being requested for you today.  We'll contact you with results. Please come back for a follow-up appointment in 3 months.    Please increase levemir to 70 units qam.

## 2014-01-16 IMAGING — US US SOFT TISSUE HEAD/NECK
1 series · 14 of 25 positions shown · non-contrast
Comparison: None.

CLINICAL DATA: Hyperthyroidism

EXAM:
THYROID ULTRASOUND
TECHNIQUE: Ultrasound examination of the thyroid gland and adjacent soft
tissues was performed.

[Series 1: us soft tissue head/neck · 0.11mm/px · 14 of 36 slices shown]
[im 1/36]
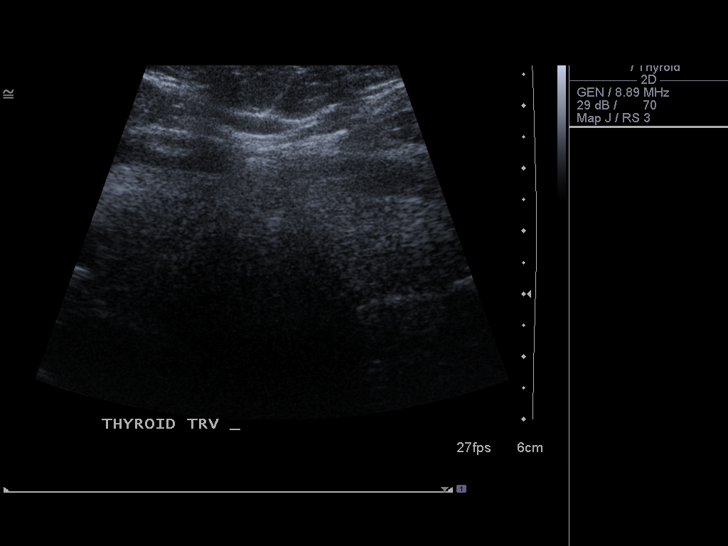
[im 3/36]
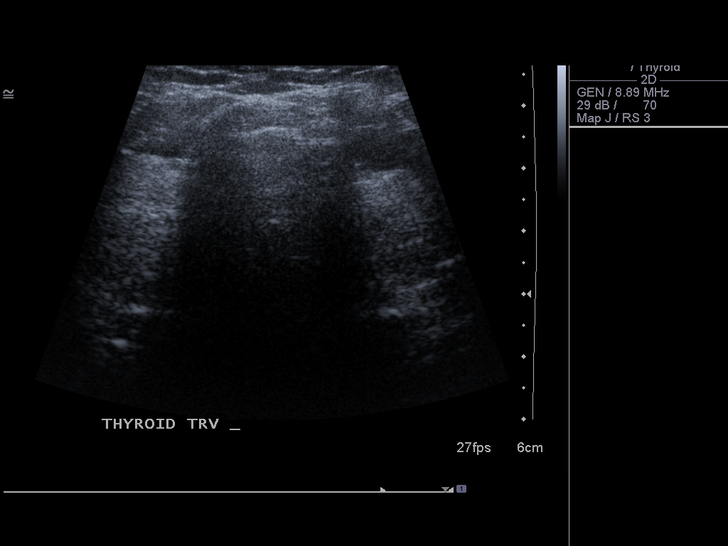
[im 6/36]
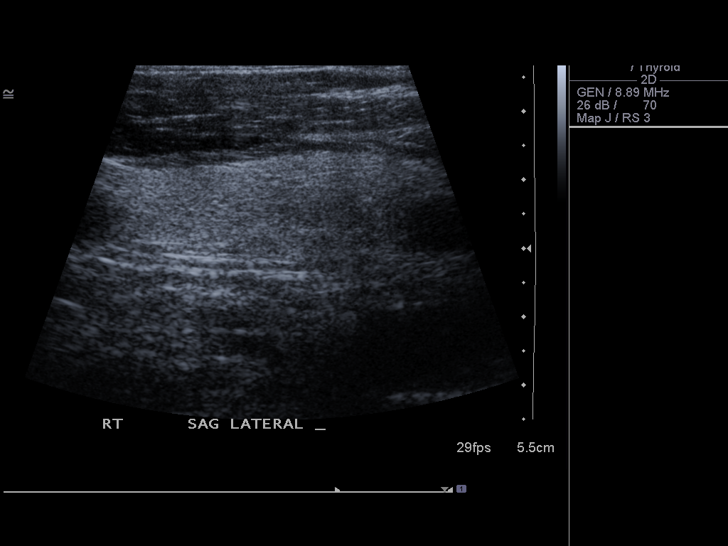
[im 9/36]
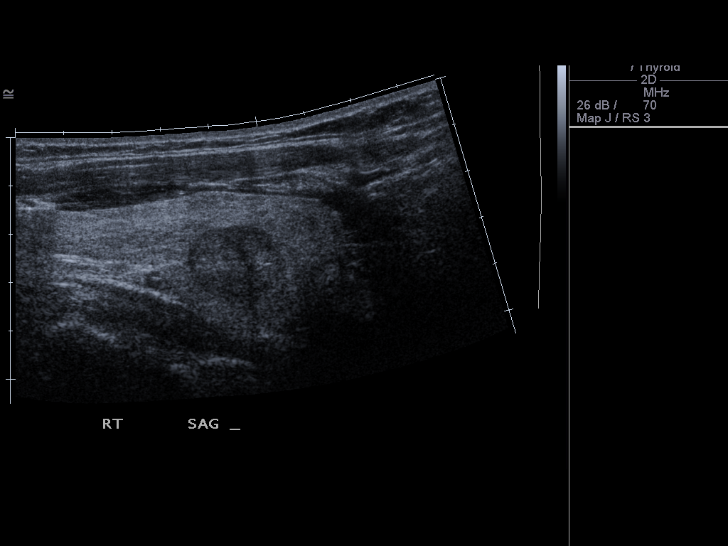
[im 12/36]
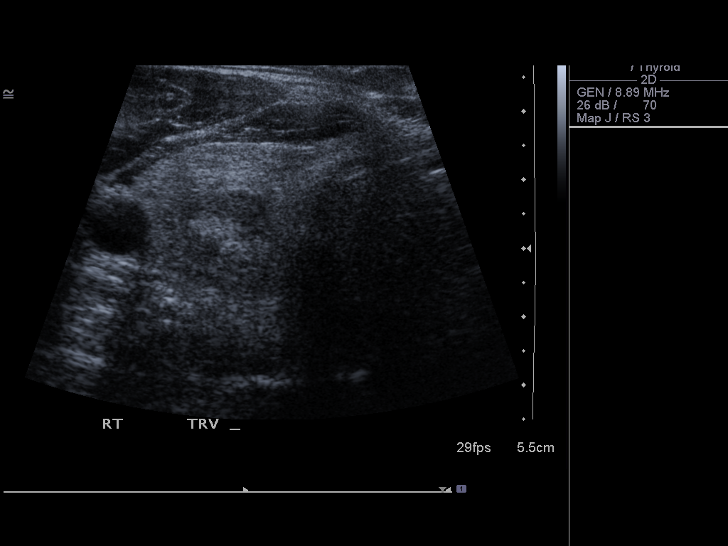
[im 14/36]
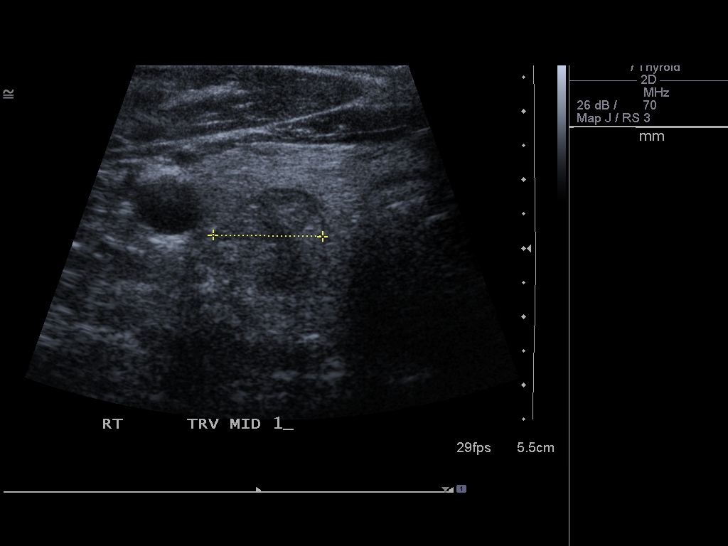
[im 17/36]
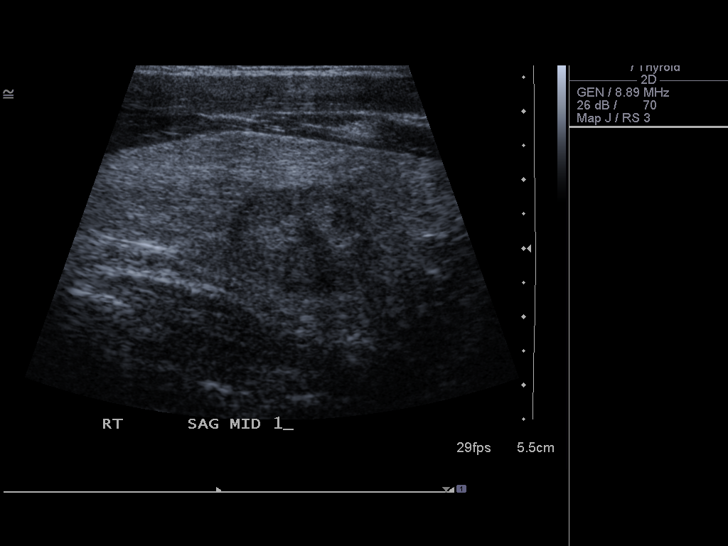
[im 19/36]
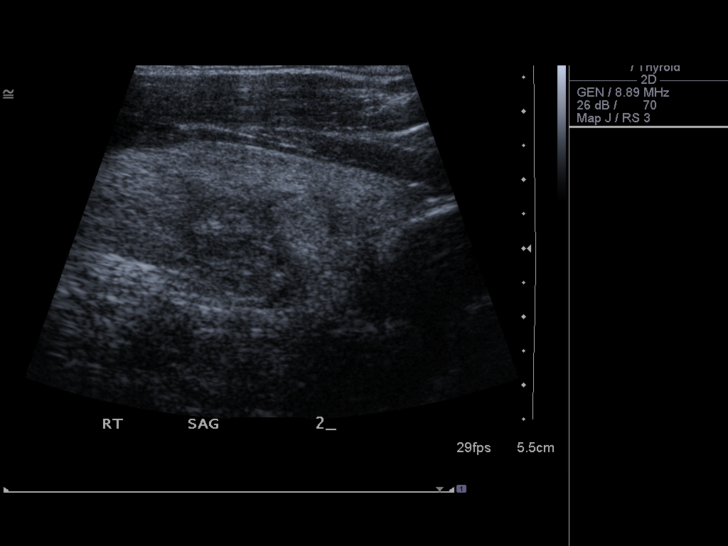
[im 22/36]
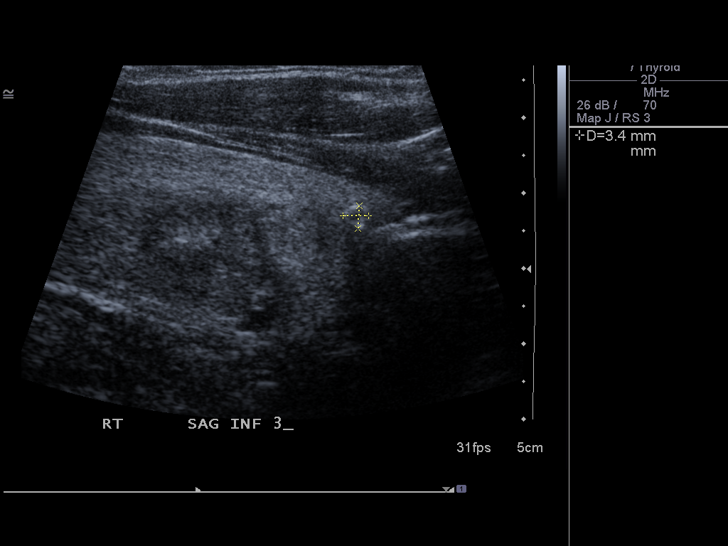
[im 24/36]
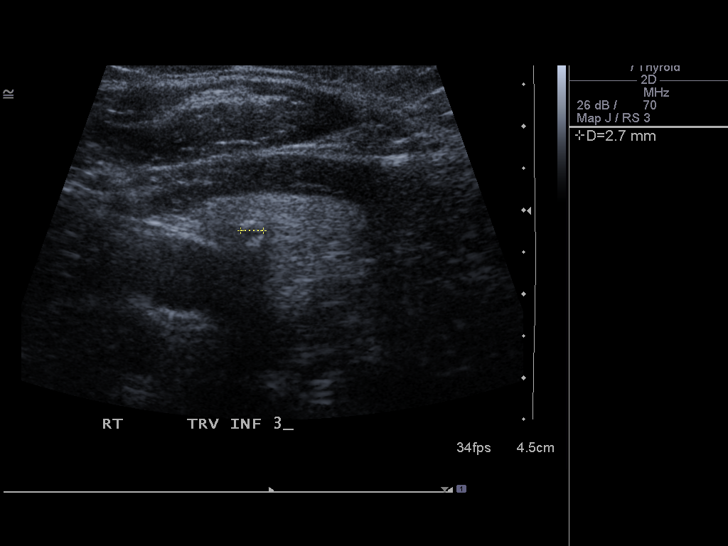
[im 27/36]
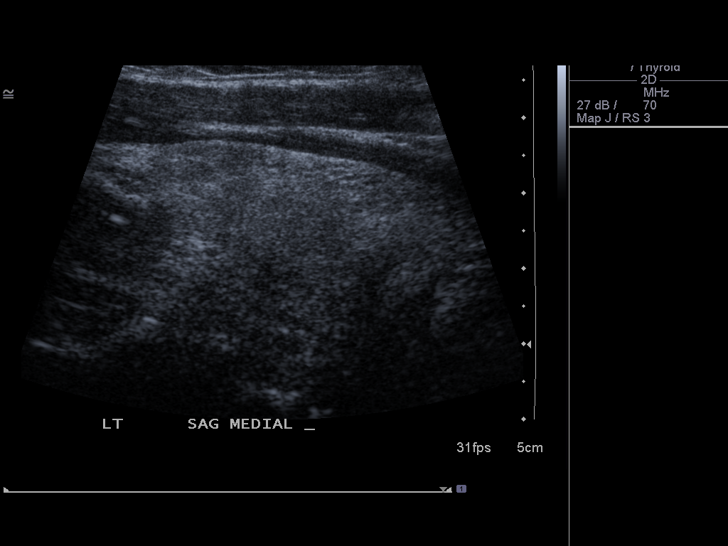
[im 30/36]
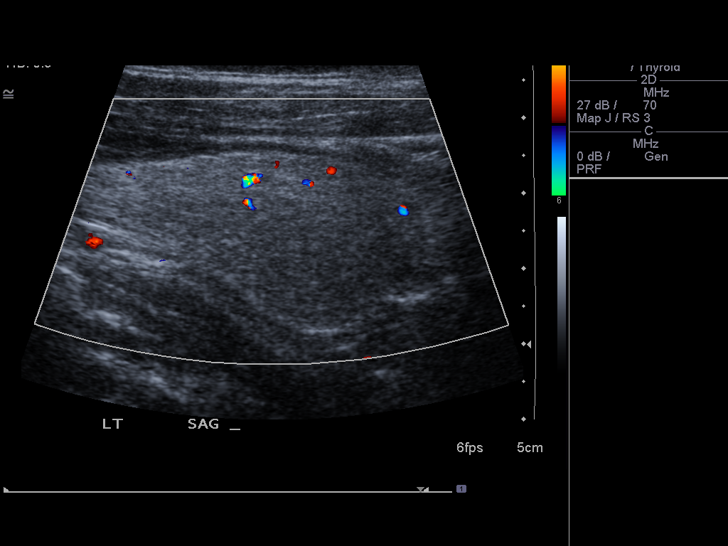
[im 33/36]
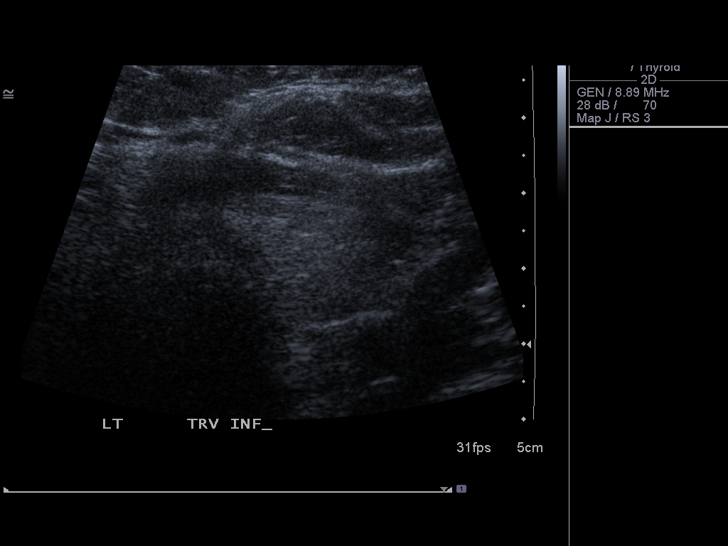
[im 36/36]
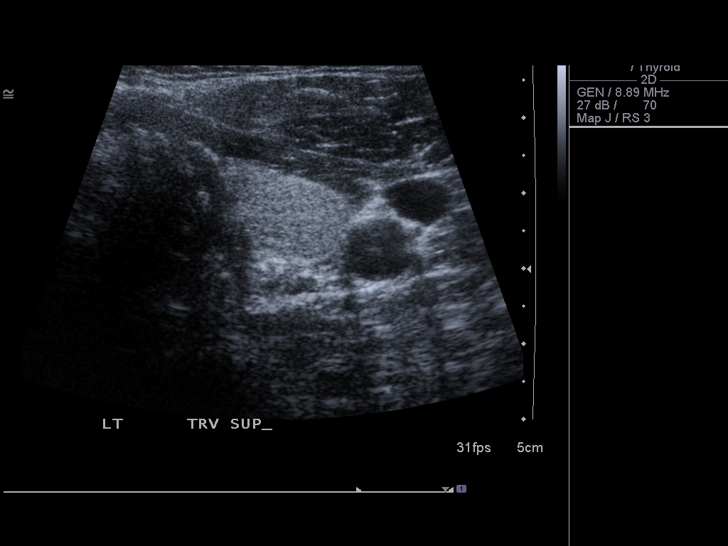

[14 of 25 positions shown; findings below may reference images not displayed]

FINDINGS: Right thyroid lobe

Measurements: 6.6 x 2.9 x 2.9 cm several rounded nodules are present
within the right lobe. The largest is a 2.3 x 1.6 cm solid nodule in
the mid right thyroid gland.

Left thyroid lobe

Measurements: 5.7 x 2.4 x 2.4 cm.  No nodules visualized.

Isthmus

Thickness:  0.4 cm   No nodules visualized.

Lymphadenopathy

None visualized.
IMPRESSION: 1. Multiple nodules within the right lobe of thyroid gland. The
largest nodule measures 2.3 cm and is solid. Findings meet consensus
criteria for biopsy. Ultrasound-guided fine needle aspiration should
be considered, as per the consensus statement: Management of Thyroid
Nodules Detected at US: Society of Radiologists in Ultrasound

## 2014-01-18 ENCOUNTER — Encounter (HOSPITAL_BASED_OUTPATIENT_CLINIC_OR_DEPARTMENT_OTHER): Payer: Self-pay | Admitting: Emergency Medicine

## 2014-01-18 ENCOUNTER — Emergency Department (HOSPITAL_BASED_OUTPATIENT_CLINIC_OR_DEPARTMENT_OTHER)
Admission: EM | Admit: 2014-01-18 | Discharge: 2014-01-18 | Disposition: A | Discharge status: Home / Self Care | Payer: BC Managed Care – PPO | Attending: Emergency Medicine | Admitting: Emergency Medicine

## 2014-01-18 DIAGNOSIS — E119 Type 2 diabetes mellitus without complications: Secondary | ICD-10-CM | POA: Diagnosis not present

## 2014-01-18 DIAGNOSIS — E785 Hyperlipidemia, unspecified: Secondary | ICD-10-CM | POA: Insufficient documentation

## 2014-01-18 DIAGNOSIS — M79674 Pain in right toe(s): Secondary | ICD-10-CM | POA: Diagnosis present

## 2014-01-18 DIAGNOSIS — I10 Essential (primary) hypertension: Secondary | ICD-10-CM | POA: Insufficient documentation

## 2014-01-18 DIAGNOSIS — Z87442 Personal history of urinary calculi: Secondary | ICD-10-CM | POA: Insufficient documentation

## 2014-01-18 DIAGNOSIS — Z794 Long term (current) use of insulin: Secondary | ICD-10-CM | POA: Insufficient documentation

## 2014-01-18 DIAGNOSIS — M109 Gout, unspecified: Secondary | ICD-10-CM | POA: Insufficient documentation

## 2014-01-18 DIAGNOSIS — Z86010 Personal history of colonic polyps: Secondary | ICD-10-CM | POA: Insufficient documentation

## 2014-01-18 DIAGNOSIS — Z79899 Other long term (current) drug therapy: Secondary | ICD-10-CM | POA: Insufficient documentation

## 2014-01-18 DIAGNOSIS — E0590 Thyrotoxicosis, unspecified without thyrotoxic crisis or storm: Secondary | ICD-10-CM | POA: Insufficient documentation

## 2014-01-18 MED ORDER — HYDROCODONE-ACETAMINOPHEN 5-325 MG PO TABS: 1.0000 | ORAL_TABLET | Freq: Four times a day (QID) | ORAL | Status: DC | PRN

## 2014-01-18 MED ORDER — INDOMETHACIN 25 MG PO CAPS
50.0000 mg | ORAL_CAPSULE | Freq: Once | ORAL | Status: AC
  Administered 2014-01-18: 50 mg via ORAL
  Filled 2014-01-18: qty 2

## 2014-01-18 MED ORDER — INDOMETHACIN 25 MG PO CAPS: 25.0000 mg | ORAL_CAPSULE | Freq: Three times a day (TID) | ORAL | Status: DC | PRN

## 2014-01-18 NOTE — ED Provider Notes (Signed)
CSN: [id number]     Arrival date & time 01/18/14  1909 History  This chart was scribed for Evelina Bucy, MD by Randa Evens, ED Scribe. This patient was seen in room MH07/MH07 and the patient's care was started at 7:17 PM.    Chief Complaint  Patient presents with  . Toe Pain   Patient is a 58 y.o. male presenting with toe pain. The history is provided by the patient. No language interpreter was used.  Toe Pain This is a new problem. The current episode started yesterday. The problem occurs rarely. The problem has not changed since onset.Nothing aggravates the symptoms. Nothing relieves the symptoms. He has tried a cold compress for the symptoms.   HPI Comments: Alex Rocha is a 58 y.o. male with PMHx of diabetes who presents to the Emergency Department complaining of right great toe pain onset 1 day prior. He states that his pain started when walking out of the restaurant. He states last night he was wearing new basketball sneakers. He states that the toe is also very tender. He states he has been applying ice with no relief. He states he is having this toe pain due to having gout but has no PMHx of gout. Denies fever or vomiting.   Past Medical History  Diagnosis Date  . Hyperlipidemia   . Hypertension   . Diabetes mellitus   . Environmental allergies   . Kidney stones   . Colon polyps   . Low testosterone   . Hyperthyroidism    History reviewed. No pertinent past surgical history. Family History  Problem Relation Age of Onset  . Colon cancer Mother   . Colon cancer Brother   . Hyperlipidemia Brother     Mother's side of the family  . Heart disease Sister   . Heart disease Mother   . Hypertension Mother     Mother's side of the family  . Diabetes Mother     Mother's side of the family  . Diabetes      father's family   History  Substance Use Topics  . Smoking status: Never Smoker   . Smokeless tobacco: Not on file  . Alcohol Use: Yes    Review of Systems   Constitutional: Negative for fever.  Gastrointestinal: Negative for vomiting.  Musculoskeletal: Positive for arthralgias.  All other systems reviewed and are negative.   Allergies  Review of patient's allergies indicates no known allergies.  Home Medications   Prior to Admission medications   Medication Sig Start Date End Date Taking? Authorizing Provider  acetic acid (VOSOL) 2 % otic solution 5 drops in affected ear  Tid 02/19/13   Yvonne R [name], DO  ANTARA 90 MG CAPS TAKE 1 CAPSULE BY MOUTH DAILY.    Rosalita Chessman, DO  Insulin Detemir (LEVEMIR) 100 UNIT/ML Pen Inject 70 Units into the skin every morning. And pen needles 1/day 08/25/13   Renato Shin, MD  levothyroxine (SYNTHROID, LEVOTHROID) 100 MCG tablet TAKE 1 TABLET (100 MCG TOTAL) BY MOUTH DAILY BEFORE BREAKFAST. 12/01/13   Renato Shin, MD  losartan (COZAAR) 100 MG tablet Take 1 tablet (100 mg total) by mouth daily. 02/18/13   Alferd Apa [name], DO  pravastatin (PRAVACHOL) 20 MG tablet TAKE 1 TABLET (20 MG TOTAL) BY MOUTH DAILY. 08/28/13   Rosalita Chessman, DO   Triage Vitals: BP 162/79  Pulse 72  Temp(Src) 98 F (36.7 C) (Oral)  Resp 20  Ht 6\' 2"  (1.88 m)  Wt 275 lb (  124.739 kg)  BMI 35.29 kg/m2  SpO2 100%  Physical Exam  Nursing note and vitals reviewed. Constitutional: He is oriented to person, place, and time. He appears well-developed and well-nourished.  HENT:  Head: Normocephalic and atraumatic.  Right Ear: External ear normal.  Left Ear: External ear normal.  Nose: Nose normal.  Mouth/Throat: Oropharynx is clear and moist.  Eyes: Conjunctivae and EOM are normal. Pupils are equal, round, and reactive to light.  Neck: Normal range of motion. Neck supple.  Cardiovascular: Normal rate, regular rhythm, normal heart sounds and intact distal pulses.   Pulmonary/Chest: Effort normal and breath sounds normal. No respiratory distress. He has no wheezes.  Abdominal: Soft. Bowel sounds are normal. He exhibits no distension.  There is no tenderness. There is no rebound.  Musculoskeletal: Normal range of motion.       Feet:  Neurological: He is alert and oriented to person, place, and time. He has normal reflexes.  Skin: Skin is warm and dry. No rash noted. No erythema.  Psychiatric: He has a normal mood and affect. His behavior is normal. Judgment and thought content normal.    ED Course  Procedures (including critical care time) DIAGNOSTIC STUDIES: Oxygen Saturation is 100% on RA, normal by my interpretation.    COORDINATION OF CARE: 7:28 PM-Discussed treatment plan which includes pain medications and an anti-inflammatory with pt at bedside and pt agreed to plan.     Labs Review Labs Reviewed - No data to display  Imaging Review No results found.   EKG Interpretation None      MDM   Final diagnoses:  Acute gout of right foot, unspecified cause   84M here with acute pain of R great toe, began yesterday. No trauma. R 1st MTP with very mild redness. No systemic symptoms, no cellulitis.  AFVSS here. Has had turf toe before, states not similar. Likely gout, will treat with indomethacin and pain meds. Will not give steroids as he is diabetic. Extremely mild redness, I do not think joint aspiration will be successful and I do not want to risk infecting his great toe, so will hold off on aspiration at this time.  Instructed to f/u with PCP.   I personally performed the services described in this documentation, which was scribed in my presence. The recorded information has been reviewed and is accurate.       Evelina Bucy, MD 01/18/14 [phone]

## 2014-01-18 NOTE — ED Notes (Signed)
Patient presents to ED with complaints of right foot toe pain since last night. Pt states he thinks it is gout but does not have a history of gout.

## 2014-01-18 NOTE — Discharge Instructions (Signed)

## 2014-01-26 ENCOUNTER — Encounter: Payer: Self-pay | Admitting: Family

## 2014-01-26 ENCOUNTER — Ambulatory Visit (INDEPENDENT_AMBULATORY_CARE_PROVIDER_SITE_OTHER): Payer: BC Managed Care – PPO | Admitting: Family

## 2014-01-26 VITALS — BP 128/74 | HR 88 | Temp 98.5°F | Resp 18 | Ht 74.0 in | Wt 277.8 lb

## 2014-01-26 DIAGNOSIS — M1000 Idiopathic gout, unspecified site: Secondary | ICD-10-CM

## 2014-01-26 DIAGNOSIS — M109 Gout, unspecified: Secondary | ICD-10-CM

## 2014-01-26 DIAGNOSIS — Z23 Encounter for immunization: Secondary | ICD-10-CM

## 2014-01-26 MED ORDER — COLCHICINE 0.6 MG PO TABS: ORAL_TABLET | ORAL | Status: DC

## 2014-01-26 NOTE — Progress Notes (Signed)
   Subjective:    Patient ID: Alex Rocha, male    DOB: 1955-08-30, 58 y.o.   MRN: [id number]  HPI  Alex Rocha is a 58 yr old male who presents today with chief complaint of pain at the base of the right toe. Pt was seen in ED 10/4 - was treated with indomethacin and pain meds.  Reports that pain has gotten "a little better." However pain is not resolved.  Denies fever.     Review of Systems See HPI    Objective:   Physical Exam  Constitutional: He is oriented to person, place, and time. He appears well-developed and well-nourished. No distress.  HENT:  Head: Normocephalic and atraumatic.  Musculoskeletal:  + tenderness to palpation base of right great toe, mild associated erythema and tenderness.   Neurological: He is alert and oriented to person, place, and time.  Skin: Skin is warm and dry.  Psychiatric: He has a normal mood and affect. His behavior is normal. Judgment and thought content normal.          Assessment & Plan:

## 2014-01-26 NOTE — Patient Instructions (Signed)
Start colchicine- take two tabs now, then one tab by mouth one hour later.  May repeat tomorrow as needed. Call if symptoms worsen or if not improved in 2-3 days.

## 2014-01-26 NOTE — Progress Notes (Signed)
Pre visit review using our clinic review tool, if applicable. No additional management support is needed unless otherwise documented below in the visit note. 

## 2014-01-27 DIAGNOSIS — M109 Gout, unspecified: Secondary | ICD-10-CM | POA: Insufficient documentation

## 2014-01-27 LAB — URIC ACID: Uric Acid, Serum: 6.7 mg/dL (ref 4.0–7.8)

## 2014-01-27 NOTE — Assessment & Plan Note (Signed)
Trial of colchicine.  Symptoms most consistent with gout.  Uric acid level is obtained and is WNL.  If no improvement consider ortho referral for further evaluation.

## 2014-02-13 ENCOUNTER — Encounter: Payer: Self-pay | Admitting: Physician Assistant

## 2014-02-13 ENCOUNTER — Ambulatory Visit (INDEPENDENT_AMBULATORY_CARE_PROVIDER_SITE_OTHER): Payer: BC Managed Care – PPO | Admitting: Physician Assistant

## 2014-02-13 VITALS — BP 112/69 | HR 69 | Temp 97.8°F | Resp 16 | Ht 74.0 in | Wt 278.0 lb

## 2014-02-13 DIAGNOSIS — K219 Gastro-esophageal reflux disease without esophagitis: Secondary | ICD-10-CM

## 2014-02-13 NOTE — Assessment & Plan Note (Signed)
Likely NSAID induced. Symptoms improving with discontinuation of Aleve.  Recommend daily probiotic and Nexium daily for 2 weeks.  Resume normal diet.  Follow-up PRN.

## 2014-02-13 NOTE — Patient Instructions (Signed)
Please discontinue use of Aleve as it is contributing to your symptoms.  Increase fluids.  Take a daily probiotic.  Use your Nexium daily over the next 2 weeks.  Resume normal diet as tolerated.  Follow-up if symptoms are not resolving.  Food Choices for Gastroesophageal Reflux Disease When you have gastroesophageal reflux disease (GERD), the foods you eat and your eating habits are very important. Choosing the right foods can help ease the discomfort of GERD. WHAT GENERAL GUIDELINES DO I NEED TO FOLLOW?  Choose fruits, vegetables, whole grains, low-fat dairy products, and low-fat meat, fish, and poultry.  Limit fats such as oils, salad dressings, butter, nuts, and avocado.  Keep a food diary to identify foods that cause symptoms.  Avoid foods that cause reflux. These may be different for different people.  Eat frequent small meals instead of three large meals each day.  Eat your meals slowly, in a relaxed setting.  Limit fried foods.  Cook foods using methods other than frying.  Avoid drinking alcohol.  Avoid drinking large amounts of liquids with your meals.  Avoid bending over or lying down until 2-3 hours after eating. WHAT FOODS ARE NOT RECOMMENDED? The following are some foods and drinks that may worsen your symptoms: Vegetables Tomatoes. Tomato juice. Tomato and spaghetti sauce. Chili peppers. Onion and garlic. Horseradish. Fruits Oranges, grapefruit, and lemon (fruit and juice). Meats High-fat meats, fish, and poultry. This includes hot dogs, ribs, ham, sausage, salami, and bacon. Dairy Whole milk and chocolate milk. Sour cream. Cream. Butter. Ice cream. Cream cheese.  Beverages Coffee and tea, with or without caffeine. Carbonated beverages or energy drinks. Condiments Hot sauce. Barbecue sauce.  Sweets/Desserts Chocolate and cocoa. Donuts. Peppermint and spearmint. Fats and Oils High-fat foods, including [place] fries and potato chips. Other Vinegar. Strong  spices, such as black pepper, white pepper, red pepper, cayenne, curry powder, cloves, ginger, and chili powder. The items listed above may not be a complete list of foods and beverages to avoid. Contact your dietitian for more information. Document Released: 04/03/2005 Document Revised: 04/08/2013 Document Reviewed: 02/05/2013 [facility] Patient Information 2015 [place], [place]. This information is not intended to replace advice given to you by your health care provider. Make sure you discuss any questions you have with your health care provider.

## 2014-02-13 NOTE — Progress Notes (Signed)
Patient presents to clinic today c/o constipation and abdominal discomfort times 1 week.  States he finally took a laxative and Probiotic and was able to have good bowel movements with resolution of pain.  Endorses some acid reflux, indigestion, some pain with eating.  Denies melena, hematochezia, tenesmus. Denies recent change to diet.  Denies alcohol consumption, smoking.  Endorses recent increased use of Aleve to help with gout pain.   Past Medical History  Diagnosis Date  . Hyperlipidemia   . Hypertension   . Diabetes mellitus   . Environmental allergies   . Kidney stones   . Colon polyps   . Low testosterone   . Hyperthyroidism     Current Outpatient Prescriptions on File Prior to Visit  Medication Sig Dispense Refill  . acetic acid (VOSOL) 2 % otic solution 5 drops in affected ear  Tid  15 mL  5  . ANTARA 90 MG CAPS TAKE 1 CAPSULE BY MOUTH DAILY.  30 capsule  5  . colchicine 0.6 MG tablet take two tabs now, then one tab by mouth one hour later  12 tablet  0  . indomethacin (INDOCIN) 25 MG capsule Take 1 capsule (25 mg total) by mouth 3 (three) times daily as needed.  30 capsule  0  . Insulin Detemir (LEVEMIR) 100 UNIT/ML Pen Inject 70 Units into the skin every morning. And pen needles 1/day      . JANUMET XR [phone] MG TB24 Take 1 tablet by mouth daily.      Marland Kitchen levothyroxine (SYNTHROID, LEVOTHROID) 100 MCG tablet TAKE 1 TABLET (100 MCG TOTAL) BY MOUTH DAILY BEFORE BREAKFAST.  30 tablet  2  . losartan (COZAAR) 100 MG tablet Take 1 tablet (100 mg total) by mouth daily.  90 tablet  3  . pravastatin (PRAVACHOL) 20 MG tablet TAKE 1 TABLET (20 MG TOTAL) BY MOUTH DAILY.  30 tablet  5   No current facility-administered medications on file prior to visit.    No Known Allergies  Family History  Problem Relation Age of Onset  . Colon cancer Mother   . Colon cancer Brother   . Hyperlipidemia Brother     Mother's side of the family  . Heart disease Sister   . Heart disease Mother     . Hypertension Mother     Mother's side of the family  . Diabetes Mother     Mother's side of the family  . Diabetes      father's family    History   Social History  . Marital Status: Married    Spouse Name: N/A    Number of Children: N/A  . Years of Education: N/A   Social History Main Topics  . Smoking status: Never Smoker   . Smokeless tobacco: None  . Alcohol Use: Yes  . Drug Use: No  . Sexual Activity: None   Other Topics Concern  . None   Social History Narrative  . None   Review of Systems - See HPI.  All other ROS are negative.  BP 112/69  Pulse 69  Temp(Src) 97.8 F (36.6 C) (Oral)  Resp 16  Ht 6\' 2"  (1.88 m)  Wt 278 lb (126.1 kg)  BMI 35.68 kg/m2  SpO2 100%  Physical Exam  Vitals reviewed. Constitutional: He is oriented to person, place, and time and well-developed, well-nourished, and in no distress.  HENT:  Head: Normocephalic and atraumatic.  Eyes: Conjunctivae are normal. Pupils are equal, round, and reactive to light.  Neck: Neck supple.  Cardiovascular: Normal rate, regular rhythm, normal heart sounds and intact distal pulses.   Pulmonary/Chest: Effort normal and breath sounds normal. No respiratory distress. He has no wheezes. He has no rales. He exhibits no tenderness.  Abdominal: Soft. Bowel sounds are normal. He exhibits no distension and no mass. There is no tenderness. There is no rebound and no guarding.  Neurological: He is alert and oriented to person, place, and time.  Skin: Skin is warm and dry. No rash noted.  Psychiatric: Affect normal.    Recent Results (from the past 2160 hour(s))  HEMOGLOBIN A1C     Status: Abnormal   Collection Time    12/08/13  9:57 AM      Result Value Ref Range   Hemoglobin A1C 12.1 (*) 4.6 - 6.5 %   Comment: Glycemic Control Guidelines for People with Diabetes:Non Diabetic:  <6%Goal of Therapy: <7%Additional Action Suggested:  >8%   TSH     Status: None   Collection Time    12/08/13  9:57 AM       Result Value Ref Range   TSH 2.69  0.35 - 4.50 uIU/mL  URIC ACID     Status: None   Collection Time    01/26/14  5:55 PM      Result Value Ref Range   Uric Acid, Serum 6.7  4.0 - 7.8 mg/dL   Assessment/Plan: Gastroesophageal reflux disease without esophagitis Likely NSAID induced. Symptoms improving with discontinuation of Aleve.  Recommend daily probiotic and Nexium daily for 2 weeks.  Resume normal diet.  Follow-up PRN.

## 2014-02-13 NOTE — Progress Notes (Signed)
Pre visit review using our clinic review tool, if applicable. No additional management support is needed unless otherwise documented below in the visit note/SLS  

## 2014-02-26 ENCOUNTER — Other Ambulatory Visit: Payer: Self-pay | Admitting: Endocrinology

## 2014-03-05 ENCOUNTER — Other Ambulatory Visit: Payer: Self-pay

## 2014-03-05 DIAGNOSIS — I10 Essential (primary) hypertension: Secondary | ICD-10-CM

## 2014-03-05 MED ORDER — LOSARTAN POTASSIUM 100 MG PO TABS: 100.0000 mg | ORAL_TABLET | Freq: Every day | ORAL | Status: DC

## 2014-03-09 ENCOUNTER — Encounter: Payer: Self-pay | Admitting: Endocrinology

## 2014-03-09 ENCOUNTER — Ambulatory Visit (INDEPENDENT_AMBULATORY_CARE_PROVIDER_SITE_OTHER): Payer: BC Managed Care – PPO | Admitting: Endocrinology

## 2014-03-09 VITALS — BP 136/70 | HR 87 | Temp 98.3°F | Ht 74.0 in | Wt 279.0 lb

## 2014-03-09 DIAGNOSIS — E119 Type 2 diabetes mellitus without complications: Secondary | ICD-10-CM

## 2014-03-09 LAB — LIPID PANEL
Cholesterol: 169 mg/dL (ref 0–200)
HDL: 29.9 mg/dL — ABNORMAL LOW (ref >39.00)
NonHDL: 139.1
Total CHOL/HDL Ratio: 6
Triglycerides: 202 mg/dL — ABNORMAL HIGH (ref 0.0–149.0)
VLDL: 40.4 mg/dL — ABNORMAL HIGH (ref 0.0–40.0)

## 2014-03-09 LAB — HEMOGLOBIN A1C: Hgb A1c MFr Bld: 9.7 % — ABNORMAL HIGH (ref 4.6–6.5)

## 2014-03-09 LAB — TSH: TSH: 2.7 u[IU]/mL (ref 0.35–4.50)

## 2014-03-09 LAB — LDL CHOLESTEROL, DIRECT: Direct LDL: 88.9 mg/dL

## 2014-03-09 NOTE — Patient Instructions (Signed)
check your blood sugar twice a day.  vary the time of day when you check, between before the 3 meals, and at bedtime.  also check if you have symptoms of your blood sugar being too high or too low.  please keep a record of the readings and bring it to your next appointment here.  You can write it on any piece of paper.  please call us sooner if your blood sugar goes below 70, or if you have a lot of readings over 200. blood tests are being requested for you today.  We'll contact you with results. Please come back for a follow-up appointment in 3 months.   

## 2014-03-09 NOTE — Progress Notes (Signed)
Subjective:    Patient ID: Alex Rocha, male    DOB: Mar 13, 1956, 58 y.o.   MRN: [id number]  HPI Pt returns for f/u of diabetes mellitus: DM type: Insulin-requiring type 2 Dx'ed: [id number] Complications: none Therapy: insulin since 2014 DKA: never Severe hypoglycemia: never Pancreatitis: never Other: he chose a simple qd insulin regimen; he says his need to care for his wife, who has cancer, limits his ability to care for himself; he declines weight-loss surgery Interval history: no cbg record, but states cbg's are well-controlled.  There is no trend throughout the day.  He says he misses the insulin approx once a month.  In 2014, pt was found to have a multinodular goiter. bx was benign.  In November of 2014, pt had i-131 rx for associated hyperthyroidism.  He takes synthroid as rx'ed.  pt states he feels well in general.  Past Medical History  Diagnosis Date  . Hyperlipidemia   . Hypertension   . Diabetes mellitus   . Environmental allergies   . Kidney stones   . Colon polyps   . Low testosterone   . Hyperthyroidism     No past surgical history on file.  History   Social History  . Marital Status: Married    Spouse Name: N/A    Number of Children: N/A  . Years of Education: N/A   Occupational History  . Not on file.   Social History Main Topics  . Smoking status: Never Smoker   . Smokeless tobacco: Not on file  . Alcohol Use: Yes  . Drug Use: No  . Sexual Activity: Not on file   Other Topics Concern  . Not on file   Social History Narrative    Current Outpatient Prescriptions on File Prior to Visit  Medication Sig Dispense Refill  . acetic acid (VOSOL) 2 % otic solution 5 drops in affected ear  Tid 15 mL 5  . ANTARA 90 MG CAPS TAKE 1 CAPSULE BY MOUTH DAILY. 30 capsule 5  . colchicine 0.6 MG tablet take two tabs now, then one tab by mouth one hour later 12 tablet 0  . indomethacin (INDOCIN) 25 MG capsule Take 1 capsule (25 mg total) by mouth 3 (three) times  daily as needed. 30 capsule 0  . Insulin Detemir (LEVEMIR) 100 UNIT/ML Pen Inject 70 Units into the skin every morning. And pen needles 1/day    . JANUMET XR [phone] MG TB24 Take 1 tablet by mouth daily.    Marland Kitchen levothyroxine (SYNTHROID, LEVOTHROID) 100 MCG tablet TAKE 1 TABLET BY MOUTH ONCE DAILY BEFORE BREAKFAST 30 tablet 2  . losartan (COZAAR) 100 MG tablet Take 1 tablet (100 mg total) by mouth daily. 90 tablet 1  . pravastatin (PRAVACHOL) 20 MG tablet TAKE 1 TABLET (20 MG TOTAL) BY MOUTH DAILY. 30 tablet 5   No current facility-administered medications on file prior to visit.    No Known Allergies  Family History  Problem Relation Age of Onset  . Colon cancer Mother   . Colon cancer Brother   . Hyperlipidemia Brother     Mother's side of the family  . Heart disease Sister   . Heart disease Mother   . Hypertension Mother     Mother's side of the family  . Diabetes Mother     Mother's side of the family  . Diabetes      father's family    BP 136/70 mmHg  Pulse 87  Temp(Src) 98.3 F (36.8 C) (Oral)  Ht 6\' 2"  (1.88 m)  Wt 279 lb (126.554 kg)  BMI 35.81 kg/m2  SpO2 93%    Review of Systems He denies hypoglycemia and weight change.      Objective:   Physical Exam VITAL SIGNS:  See vs page GENERAL: no distress Pulses: dorsalis pedis intact bilat.   Feet: no deformity. normal color and temp.  no edema.  There is bilateral onychomycosis Skin:  no ulcer on the feet.   Neuro: sensation is intact to touch on the feet  Lab Results  Component Value Date   HGBA1C 9.7* 03/09/2014   Lab Results  Component Value Date   TSH 2.70 03/09/2014   Lab Results  Component Value Date   CHOL 169 03/09/2014   HDL 29.90* 03/09/2014   LDLDIRECT 88.9 03/09/2014   TRIG 202.0* 03/09/2014   CHOLHDL 6 03/09/2014       Assessment & Plan:  DM: severe exacerbation Dyslipidemia: well-controlled Noncompliance with cbg recording: I'll work around this as best I can, which for now is to  titrate insulin based on a1c. 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  vary the time of day when you check, between before the 3 meals, and at bedtime.  also check if you have symptoms of your blood sugar being too high or too low.  please keep a record of the readings and bring it to your next appointment here.  You can write it on any piece of paper.  please call us sooner if your blood sugar goes below 70, or if you have a lot of readings over 200.   blood tests are being requested for you today.  We'll contact you with results. Please come back for a follow-up appointment in 3 months.      increase the levemir to 100 units qam.

## 2014-03-26 ENCOUNTER — Other Ambulatory Visit: Payer: Self-pay | Admitting: Family Medicine

## 2014-04-24 ENCOUNTER — Encounter: Payer: Self-pay | Admitting: Family Medicine

## 2014-04-24 ENCOUNTER — Ambulatory Visit (INDEPENDENT_AMBULATORY_CARE_PROVIDER_SITE_OTHER): Payer: BC Managed Care – PPO | Admitting: Family Medicine

## 2014-04-24 VITALS — BP 120/64 | HR 89 | Temp 98.5°F | Resp 16 | Wt 283.2 lb

## 2014-04-24 DIAGNOSIS — R1013 Epigastric pain: Secondary | ICD-10-CM

## 2014-04-24 DIAGNOSIS — K5901 Slow transit constipation: Secondary | ICD-10-CM

## 2014-04-24 DIAGNOSIS — R12 Heartburn: Secondary | ICD-10-CM

## 2014-04-24 DIAGNOSIS — R0789 Other chest pain: Secondary | ICD-10-CM

## 2014-04-24 DIAGNOSIS — E785 Hyperlipidemia, unspecified: Secondary | ICD-10-CM

## 2014-04-24 LAB — CBC WITH DIFFERENTIAL/PLATELET
Basophils Absolute: 0 K/uL (ref 0.0–0.1)
Basophils Relative: 0.6 % (ref 0.0–3.0)
Eosinophils Absolute: 0.1 K/uL (ref 0.0–0.7)
Eosinophils Relative: 2.3 % (ref 0.0–5.0)
HCT: 40.4 % (ref 39.0–52.0)
Hemoglobin: 13.4 g/dL (ref 13.0–17.0)
Lymphocytes Relative: 31.4 % (ref 12.0–46.0)
Lymphs Abs: 1.9 K/uL (ref 0.7–4.0)
MCHC: 33.2 g/dL (ref 30.0–36.0)
MCV: 92.3 fl (ref 78.0–100.0)
Monocytes Absolute: 0.5 K/uL (ref 0.1–1.0)
Monocytes Relative: 8.8 % (ref 3.0–12.0)
Neutro Abs: 3.4 K/uL (ref 1.4–7.7)
Neutrophils Relative %: 56.9 % (ref 43.0–77.0)
Platelets: 289 K/uL (ref 150.0–400.0)
RBC: 4.38 Mil/uL (ref 4.22–5.81)
RDW: 13.2 % (ref 11.5–15.5)
WBC: 5.9 K/uL (ref 4.0–10.5)

## 2014-04-24 LAB — HEPATIC FUNCTION PANEL
ALT: 30 U/L (ref 0–53)
AST: 35 U/L (ref 0–37)
Albumin: 4.5 g/dL (ref 3.5–5.2)
Alkaline Phosphatase: 62 U/L (ref 39–117)
Bilirubin, Direct: 0 mg/dL (ref 0.0–0.3)
Total Bilirubin: 0.3 mg/dL (ref 0.2–1.2)
Total Protein: 7.6 g/dL (ref 6.0–8.3)

## 2014-04-24 LAB — LIPID PANEL
Cholesterol: 231 mg/dL — ABNORMAL HIGH (ref 0–200)
HDL: 30.1 mg/dL — ABNORMAL LOW
NonHDL: 200.9
Total CHOL/HDL Ratio: 8
Triglycerides: 551 mg/dL — ABNORMAL HIGH (ref 0.0–149.0)
VLDL: 110.2 mg/dL — ABNORMAL HIGH (ref 0.0–40.0)

## 2014-04-24 LAB — BASIC METABOLIC PANEL
BUN: 23 mg/dL (ref 6–23)
CO2: 21 mEq/L (ref 19–32)
Calcium: 9.5 mg/dL (ref 8.4–10.5)
Chloride: 105 mEq/L (ref 96–112)
Creatinine, Ser: 1.4 mg/dL (ref 0.4–1.5)
GFR: 66.8 mL/min (ref >60.00)
Glucose, Bld: 266 mg/dL — ABNORMAL HIGH (ref 70–99)
Potassium: 4.5 mEq/L (ref 3.5–5.1)
Sodium: 136 mEq/L (ref 135–145)

## 2014-04-24 LAB — THYROID PANEL WITH TSH
Free Thyroxine Index: 1.7 (ref 1.4–3.8)
T3 Uptake: 29 % (ref 22–35)
T4, Total: 6 ug/dL (ref 4.5–12.0)
TSH: 4.06 u[IU]/mL (ref 0.350–4.500)

## 2014-04-24 LAB — H. PYLORI ANTIBODY, IGG: H Pylori IgG: POSITIVE — AB

## 2014-04-24 LAB — LDL CHOLESTEROL, DIRECT: Direct LDL: 122.1 mg/dL

## 2014-04-24 MED ORDER — OMEPRAZOLE 40 MG PO CPDR: 40.0000 mg | DELAYED_RELEASE_CAPSULE | Freq: Every day | ORAL | Status: DC

## 2014-04-24 NOTE — Patient Instructions (Signed)

## 2014-04-24 NOTE — Progress Notes (Signed)
Pre visit review using our clinic review tool, if applicable. No additional management support is needed unless otherwise documented below in the visit note. 

## 2014-04-24 NOTE — Progress Notes (Signed)
Subjective:     Algernon Coccaro is a 59 y.o. male who presents for evaluation of constipation. Onset was 4 weeks ago. Patient has been having rare pellet like stools per week. Defecation has been difficult. Co-Morbid conditions:culprit medication: none and endocrine disease. Symptoms have gradually worsened. Current Health Habits: Eating fiber? yes -veg, oatmeal.  Exercise? yes - walking, Adequate hydration? yes - water. Current over the counter/prescription laxative: fiber,  veg laxative ,  ducolax which has been ineffective.  Pt is also c/o chest pain after eating.  He also wakes up with it.  He tried gaviscon and mylanta with some relief.    The following portions of the patient's history were reviewed and updated as appropriate:  He  has a past medical history of Hyperlipidemia; Hypertension; Diabetes mellitus; Environmental allergies; Kidney stones; Colon polyps; Low testosterone; and Hyperthyroidism. He  does not have any pertinent problems on file. He  has no past surgical history on file. His family history includes Colon cancer in his brother and mother; Diabetes in his mother and another family member; Heart disease in his mother and sister; Hyperlipidemia in his brother; Hypertension in his mother. He  reports that he has never smoked. He does not have any smokeless tobacco history on file. He reports that he drinks alcohol. He reports that he does not use illicit drugs. He has a current medication list which includes the following prescription(s): acetic acid, antara, indomethacin, insulin detemir, janumet xr, levothyroxine, losartan, pravastatin, and omeprazole. Current Outpatient Prescriptions on File Prior to Visit  Medication Sig Dispense Refill  . acetic acid (VOSOL) 2 % otic solution 5 drops in affected ear  Tid 15 mL 5  . ANTARA 90 MG CAPS TAKE 1 CAPSULE BY MOUTH DAILY. 30 capsule 5  . indomethacin (INDOCIN) 25 MG capsule Take 1 capsule (25 mg total) by mouth 3 (three) times  daily as needed. 30 capsule 0  . Insulin Detemir (LEVEMIR) 100 UNIT/ML Pen Inject 100 Units into the skin every morning. And pen needles 1/day    . JANUMET XR [phone] MG TB24 TAKE 1 TABLET BY MOUTH DAILY. 30 tablet 5  . levothyroxine (SYNTHROID, LEVOTHROID) 100 MCG tablet TAKE 1 TABLET BY MOUTH ONCE DAILY BEFORE BREAKFAST 30 tablet 2  . losartan (COZAAR) 100 MG tablet Take 1 tablet (100 mg total) by mouth daily. 90 tablet 1  . pravastatin (PRAVACHOL) 20 MG tablet TAKE 1 TABLET (20 MG TOTAL) BY MOUTH DAILY. 30 tablet 5   No current facility-administered medications on file prior to visit.   He has No Known Allergies..  Review of Systems   Objective:    BP 120/64 mmHg  Pulse 89  Temp(Src) 98.5 F (36.9 C) (Oral)  Resp 16  Wt 283 lb 3.2 oz (128.459 kg)  SpO2 95% General appearance: alert, cooperative, appears stated age and no distress Throat: lips, mucosa, and tongue normal; teeth and gums normal Neck: no adenopathy, supple, symmetrical, trachea midline and thyroid not enlarged, symmetric, no tenderness/mass/nodules Lungs: clear to auscultation bilaterally Heart: regular rate and rhythm, S1, S2 normal, no murmur, click, rub or gallop Abdomen: soft, non-tender; bowel sounds normal; no masses,  no organomegaly Extremities: extremities normal, atraumatic, no cyanosis or edema   Assessment:    Chronic constipation   Plan:    Education about constipation causes and treatment discussed. Laxative miralax. Laboratory tests per orders. GI consult.    1. Slow transit constipation   - Thyroid Panel With TSH - Basic metabolic panel -  CBC with Differential - Hepatic function panel  2. Other chest pain prob from gerd ekg--NSR - EKG [id number] - Basic metabolic panel - CBC with Differential - Hepatic function panel  4. Heartburn Every time pt eats he has chest pain and he wakes up with pain--- relieved with mylanta - Ambulatory referral to Gastroenterology - H. pylori  antibody, IgG  5. Hyperlipidemia Check labs,  con't pravastatin - Lipid panel

## 2014-04-27 ENCOUNTER — Telehealth: Payer: Self-pay | Admitting: Family Medicine

## 2014-04-27 NOTE — Telephone Encounter (Signed)
Notes Recorded by Rosalita Chessman, DO on 04/24/2014 at 10:48 PM Cholesterol--- LDL goal < 70, HDL >40, TG < 150. Diet and exercise will increase HDL and decrease LDL and TG. Fish, Fish Oil, Flaxseed oil will also help increase the HDL and decrease Triglycerides.  Recheck labs in 3 months------change pravachol to lipitor 20 mg #30  2 refills 1 po qhs Lipid, hep-- hyperlipidemia, hypertriglyceridemia   MSG left to call the office.       KP

## 2014-04-27 NOTE — Telephone Encounter (Signed)
Caller name: Tmothy, Papaleo Relation to pt: self  Call back number: [phone] Pharmacy:  Reason for call:  Pt inquiring about lab results

## 2014-05-07 ENCOUNTER — Telehealth: Payer: Self-pay

## 2014-05-07 MED ORDER — AMOXICILLIN 500 MG PO CAPS: 1000.0000 mg | ORAL_CAPSULE | Freq: Two times a day (BID) | ORAL | Status: DC

## 2014-05-07 MED ORDER — ATORVASTATIN CALCIUM 20 MG PO TABS: 20.0000 mg | ORAL_TABLET | Freq: Every day | ORAL | Status: DC

## 2014-05-07 MED ORDER — CLARITHROMYCIN 500 MG PO TABS: 500.0000 mg | ORAL_TABLET | Freq: Two times a day (BID) | ORAL | Status: DC

## 2014-05-07 NOTE — Telephone Encounter (Signed)
-----   Message from Rosalita Chessman, DO sent at 05/06/2014 10:40 AM EST ----- Amoxicillin 500 mg , 2 po bid  biaxin 500mg  1 po bid Omeprazole 20 mg bid  All for 2 weeks

## 2014-05-07 NOTE — Telephone Encounter (Signed)
Notes Recorded by Rosalita Chessman, DO on 05/06/2014 at 10:40 AM Amoxicillin 500 mg , 2 po bid  biaxin 500mg  1 po bid Omeprazole 20 mg bid  All for 2 weeks Notes Recorded by Ewing Schlein, CMA on 05/06/2014 at 9:45 AM Dr.[name] this patient has not returned my call but I noticed that he is + for H-pylori. Please advise   KP Notes Recorded by Ewing Schlein, CMA on 04/29/2014 at 10:20 AM msg left on machine @msg  left on machine @ [phone] ([place]) *Preferred* Notes Recorded by Ewing Schlein, CMA on 04/27/2014 at 5:12 PM msg left on machine @ [phone] ([place]) *Preferred* Notes Recorded by Rosalita Chessman, DO on 04/24/2014 at 10:48 PM Cholesterol--- LDL goal < 70, HDL >40, TG < 150. Diet and exercise will increase HDL and decrease LDL and TG. Fish, Fish Oil, Flaxseed oil will also help increase the HDL and decrease Triglycerides.  Recheck labs in 3 months------change pravachol to lipitor 20 mg #30  2 refills 1 po qhs Lipid, hep-- hyperlipidemia, hypertriglyceridemia

## 2014-05-07 NOTE — Telephone Encounter (Signed)
Discussed with patient and he voiced understanding, He has agreed to take the antibiotics and changes Cholesterol med's to Lipitor. All med's have been sent and he will follow up with GI.       KP

## 2014-05-26 ENCOUNTER — Other Ambulatory Visit: Payer: Self-pay | Admitting: Endocrinology

## 2014-06-09 ENCOUNTER — Ambulatory Visit: Payer: BC Managed Care – PPO | Admitting: Endocrinology

## 2014-06-12 ENCOUNTER — Encounter: Payer: Self-pay | Admitting: Endocrinology

## 2014-06-12 ENCOUNTER — Ambulatory Visit (INDEPENDENT_AMBULATORY_CARE_PROVIDER_SITE_OTHER): Payer: BC Managed Care – PPO | Admitting: Endocrinology

## 2014-06-12 VITALS — BP 134/84 | HR 82 | Temp 98.5°F | Ht 74.0 in | Wt 284.0 lb

## 2014-06-12 DIAGNOSIS — E119 Type 2 diabetes mellitus without complications: Secondary | ICD-10-CM

## 2014-06-12 LAB — HEMOGLOBIN A1C: Hgb A1c MFr Bld: 11.5 % — ABNORMAL HIGH (ref 4.6–6.5)

## 2014-06-12 LAB — MICROALBUMIN / CREATININE URINE RATIO
Creatinine,U: 138.7 mg/dL
Microalb Creat Ratio: 0.5 mg/g (ref 0.0–30.0)
Microalb, Ur: <0.7 mg/dL (ref 0.0–1.9)

## 2014-06-12 MED ORDER — INSULIN DETEMIR 100 UNIT/ML FLEXPEN: 150.0000 [IU] | PEN_INJECTOR | SUBCUTANEOUS | Status: DC

## 2014-06-12 NOTE — Patient Instructions (Addendum)
check your blood sugar twice a day.  vary the time of day when you check, between before the 3 meals, and at bedtime.  also check if you have symptoms of your blood sugar being too high or too low.  please keep a record of the readings and bring it to your next appointment here.  You can write it on any piece of paper.  please call us sooner if your blood sugar goes below 70, or if you have a lot of readings over 200.   blood tests are being requested for you today.  We'll contact you with results. Please come back for a follow-up appointment in 3 months.   You can stop taking the janumet.

## 2014-06-12 NOTE — Progress Notes (Signed)
Subjective:    Patient ID: Alex Rocha, male    DOB: 1955-09-30, 59 y.o.   MRN: [id number]  HPI Pt returns for f/u of diabetes mellitus: DM type: Insulin-requiring type 2 Dx'ed: [id number] Complications: none Therapy: insulin since 2014 DKA: never Severe hypoglycemia: never Pancreatitis: never.   Other: he chose a simple qd insulin regimen; he says his need to care for his wife, who has cancer, limits his ability to care for himself; he declines weight-loss surgery Interval history:  no cbg record, but states cbg's vary from 120-230.  There is no trend throughout the day.   In 2014, pt was found to have a multinodular goiter. bx was benign.  In November of 2014, pt had i-131 rx for associated hyperthyroidism.    Past Medical History  Diagnosis Date  . Hyperlipidemia   . Hypertension   . Diabetes mellitus   . Environmental allergies   . Kidney stones   . Colon polyps   . Low testosterone   . Hyperthyroidism     No past surgical history on file.  History   Social History  . Marital Status: Married    Spouse Name: N/A  . Number of Children: N/A  . Years of Education: N/A   Occupational History  . Not on file.   Social History Main Topics  . Smoking status: Never Smoker   . Smokeless tobacco: Not on file  . Alcohol Use: Yes  . Drug Use: No  . Sexual Activity: Not on file   Other Topics Concern  . Not on file   Social History Narrative    Current Outpatient Prescriptions on File Prior to Visit  Medication Sig Dispense Refill  . acetic acid (VOSOL) 2 % otic solution 5 drops in affected ear  Tid 15 mL 5  . ANTARA 90 MG CAPS TAKE 1 CAPSULE BY MOUTH DAILY. 30 capsule 5  . atorvastatin (LIPITOR) 20 MG tablet Take 1 tablet (20 mg total) by mouth daily. 30 tablet 2  . indomethacin (INDOCIN) 25 MG capsule Take 1 capsule (25 mg total) by mouth 3 (three) times daily as needed. 30 capsule 0  . levothyroxine (SYNTHROID, LEVOTHROID) 100 MCG tablet TAKE 1 TABLET BY MOUTH ONCE  DAILY BEFORE BREAKFAST 30 tablet 2  . losartan (COZAAR) 100 MG tablet Take 1 tablet (100 mg total) by mouth daily. 90 tablet 1  . omeprazole (PRILOSEC) 40 MG capsule Take 1 capsule (40 mg total) by mouth daily. 30 capsule 3   No current facility-administered medications on file prior to visit.    No Known Allergies  Family History  Problem Relation Age of Onset  . Colon cancer Mother   . Colon cancer Brother   . Hyperlipidemia Brother     Mother's side of the family  . Heart disease Sister   . Heart disease Mother   . Hypertension Mother     Mother's side of the family  . Diabetes Mother     Mother's side of the family  . Diabetes      father's family    BP 134/84 mmHg  Pulse 82  Temp(Src) 98.5 F (36.9 C) (Oral)  Ht 6\' 2"  (1.88 m)  Wt 284 lb (128.822 kg)  BMI 36.45 kg/m2  SpO2 96%  Review of Systems He denies hypoglycemia and weight change.     Objective:   Physical Exam VITAL SIGNS:  See vs page GENERAL: no distress Pulses: dorsalis pedis intact bilat.   MSK: no deformity of  the feet. CV: no leg edema.  Skin:  no ulcer on the feet.  normal color and temp on the feet. Neuro: sensation is intact to touch on the feet.   Ext: There is bilateral onychomycosis of the toenails.    Lab Results  Component Value Date   HGBA1C 11.5* 06/12/2014       Assessment & Plan:  DM: severe exacerbation Noncompliance with cbg recording and calling if cbg is high.  Social situation: wife's illness complicates his DM rx: unchanged. I'll work around this as best I can.  Patient is advised the following: Patient Instructions  check your blood sugar twice a day.  vary the time of day when you check, between before the 3 meals, and at bedtime.  also check if you have symptoms of your blood sugar being too high or too low.  please keep a record of the readings and bring it to your next appointment here.  You can write it on any piece of paper.  please call us sooner if your blood  sugar goes below 70, or if you have a lot of readings over 200.   blood tests are being requested for you today.  We'll contact you with results. Please come back for a follow-up appointment in 3 months.   You can stop taking the janumet.   increase levemir to 150 units qam.

## 2014-06-15 ENCOUNTER — Ambulatory Visit: Payer: BC Managed Care – PPO | Admitting: Gastroenterology

## 2014-06-15 ENCOUNTER — Encounter: Payer: Self-pay | Admitting: Gastroenterology

## 2014-06-15 ENCOUNTER — Ambulatory Visit (INDEPENDENT_AMBULATORY_CARE_PROVIDER_SITE_OTHER): Payer: BC Managed Care – PPO | Admitting: Gastroenterology

## 2014-06-15 VITALS — BP 134/70 | HR 82 | Ht 73.5 in | Wt 285.2 lb

## 2014-06-15 DIAGNOSIS — R1013 Epigastric pain: Secondary | ICD-10-CM

## 2014-06-15 DIAGNOSIS — Z800 Family history of malignant neoplasm of digestive organs: Secondary | ICD-10-CM

## 2014-06-15 NOTE — Patient Instructions (Addendum)
We will get records sent from your previous gastroenterologist for review.  This will include any endoscopic (colonoscopy or upper endoscopy) procedures and any associated pathology reports.  After reviewing this Dr. Ardis Hughs will advise on the timing of your next needed screening examination. Try to avoid excessive NSAID type medicines. Stay on omeprazole another 1 month, then cut back to every other day dosing for 2 weeks. If you must be on NSAID type medicines, be sure to also restart once daily omeprazole. Call if any GI troubles.

## 2014-06-15 NOTE — Progress Notes (Signed)
HPI: This is a  very pleasant 59 year old man whom I'm meeting for the first time today. He is here with his wife today.   Feels fine now.  He was having epigastric pain, this was going on for 3-4 weeks.  Around that time he had been taking alleve (4 in a day) for about 2 weeks. For gout pain.  He had labs, below;  He has been feeling fine since all that.  Overall his weight has been stable.  Working on DM control.  He ad colonoscopy in [place], maybe by Dr. Collene Mares however he is not sure.  Labs January 2016 CBC normal, complete metabolic profile normal except for very elevated glucose, H. pylori antigen was positive. His cholesterol panel was abnormal, most notable for very elevated triglycerides in the 5-600 level. Labs 05/2014 Hb A1c 11.5  Was put on prev-pac type H. Pylori treatment about a month ago     Review of systems: Pertinent positive and negative review of systems were noted in the above HPI section. Complete review of systems was performed and was otherwise normal.    Past Medical History  Diagnosis Date  . Hyperlipidemia   . Hypertension   . Diabetes mellitus   . Environmental allergies   . Kidney stones   . Colon polyps   . Low testosterone   . Hyperthyroidism     History reviewed. No pertinent past surgical history.  Current Outpatient Prescriptions  Medication Sig Dispense Refill  . acetic acid (VOSOL) 2 % otic solution 5 drops in affected ear  Tid 15 mL 5  . ANTARA 90 MG CAPS TAKE 1 CAPSULE BY MOUTH DAILY. 30 capsule 5  . atorvastatin (LIPITOR) 20 MG tablet Take 1 tablet (20 mg total) by mouth daily. 30 tablet 2  . indomethacin (INDOCIN) 25 MG capsule Take 1 capsule (25 mg total) by mouth 3 (three) times daily as needed. 30 capsule 0  . Insulin Detemir (LEVEMIR) 100 UNIT/ML Pen Inject 150 Units into the skin every morning. And pen needles 1/day 60 mL 11  . levothyroxine (SYNTHROID, LEVOTHROID) 100 MCG tablet TAKE 1 TABLET BY MOUTH ONCE DAILY BEFORE  BREAKFAST 30 tablet 2  . losartan (COZAAR) 100 MG tablet Take 1 tablet (100 mg total) by mouth daily. 90 tablet 1  . omeprazole (PRILOSEC) 40 MG capsule Take 1 capsule (40 mg total) by mouth daily. 30 capsule 3   No current facility-administered medications for this visit.    Allergies as of 06/15/2014  . (No Known Allergies)    Family History  Problem Relation Age of Onset  . Colon cancer Mother   . Colon cancer Brother   . Hyperlipidemia Brother     Mother's side of the family  . Heart disease Sister   . Heart disease Mother   . Hypertension Mother     Mother's side of the family  . Diabetes Mother     Mother's side of the family  . Diabetes      father's family    History   Social History  . Marital Status: Married    Spouse Name: N/A  . Number of Children: 2  . 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 Smoking status: Never Smoker   . Smokeless tobacco: Never Used  . Alcohol Use: 0.0 oz/week    0 Standard drinks or equivalent per week     Comment: social  .  Drug Use: No  . Sexual Activity: Not on file   Other Topics Concern  . Not on file   Social History Narrative       Physical Exam: BP 134/70 mmHg  Pulse 82  Ht 6' 1.5" (1.867 m)  Wt 285 lb 3 oz (129.36 kg)  BMI 37.11 kg/m2 Constitutional: generally well-appearing Psychiatric: alert and oriented x3 Eyes: extraocular movements intact Mouth: oral pharynx moist, no lesions Neck: supple no lymphadenopathy Cardiovascular: heart regular rate and rhythm Lungs: clear to auscultation bilaterally Abdomen: soft, nontender, nondistended, no obvious ascites, no peritoneal signs, normal bowel sounds Extremities: no lower extremity edema bilaterally Skin: no lesions on visible extremities    Assessment and plan: 59 y.o. male with  obesity, recent GI upset that has completely resolved  His dyspepsia, epigastric pain was likely from  NSAIDs and perhaps also H pylori infection. He has completed Prevpac type antibiotics, begun to avoid NSAIDs, he is feeling completely back to normal now. He is still on omeprazole once daily I advised him to try to taper off of that as tolerated however I did recommend that if he finds he needs to be on NSAIDs again on a daily basis he should restart the proton pump inhibitor. His brother had colon cancer and we will track down records from his previous gastroenterologist in order to advise him on timing of his next needed colon cancer screening.

## 2014-07-06 ENCOUNTER — Telehealth: Payer: Self-pay | Admitting: Gastroenterology

## 2014-07-07 NOTE — Telephone Encounter (Signed)
ROI has been faxed.

## 2014-08-12 ENCOUNTER — Telehealth: Payer: Self-pay | Admitting: *Deleted

## 2014-08-12 NOTE — Telephone Encounter (Signed)
Prior authorization for omeprazole initiated. Awaiting determination. [id number]

## 2014-08-26 ENCOUNTER — Other Ambulatory Visit: Payer: Self-pay | Admitting: Family Medicine

## 2014-08-28 ENCOUNTER — Other Ambulatory Visit: Payer: Self-pay | Admitting: Endocrinology

## 2014-09-03 ENCOUNTER — Other Ambulatory Visit: Payer: Self-pay | Admitting: Family Medicine

## 2014-09-07 ENCOUNTER — Other Ambulatory Visit: Payer: Self-pay | Admitting: Family Medicine

## 2014-09-10 ENCOUNTER — Ambulatory Visit (INDEPENDENT_AMBULATORY_CARE_PROVIDER_SITE_OTHER): Payer: BC Managed Care – PPO | Admitting: Endocrinology

## 2014-09-10 ENCOUNTER — Encounter: Payer: Self-pay | Admitting: Endocrinology

## 2014-09-10 VITALS — BP 138/88 | HR 69 | Temp 98.1°F | Ht 73.5 in | Wt 280.0 lb

## 2014-09-10 DIAGNOSIS — E119 Type 2 diabetes mellitus without complications: Secondary | ICD-10-CM | POA: Diagnosis not present

## 2014-09-10 LAB — HEMOGLOBIN A1C: Hgb A1c MFr Bld: 11.6 % — ABNORMAL HIGH (ref 4.6–6.5)

## 2014-09-10 MED ORDER — INSULIN NPH (HUMAN) (ISOPHANE) 100 UNIT/ML ~~LOC~~ SUSP: 120.0000 [IU] | SUBCUTANEOUS | Status: DC

## 2014-09-10 NOTE — Progress Notes (Signed)
Subjective:    Patient ID: Alex Rocha, male    DOB: [birth year], 57 y.o.   MRN: [id number]  HPI Pt returns for f/u of diabetes mellitus: DM type: Insulin-requiring type 2 Dx'ed: [id number] Complications: none Therapy: insulin since 2014 DKA: never Severe hypoglycemia: never Pancreatitis: never.   Other: he chose a simple qd insulin regimen; he says his need to care for his wife, who has cancer, limits his ability to care for himself; he declines weight-loss surgery Interval history:  no cbg record, but states cbg's vary from 60-200's.  It is in general higher as the day goes on.  He says he never misses the insulin. In 2014, pt was found to have a multinodular goiter. bx was benign.  In November of 2014, pt had i-131 rx for associated hyperthyroidism.  Past Medical History  Diagnosis Date  . Hyperlipidemia   . Hypertension   . Diabetes mellitus   . Environmental allergies   . Kidney stones   . Colon polyps   . Low testosterone   . Hyperthyroidism     No past surgical history on file.  History   Social History  . Marital Status: Married    Spouse Name: N/A  . Number of Children: 2  . 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 Smoking status: Never Smoker   . Smokeless tobacco: Never Used  . Alcohol Use: 0.0 oz/week    0 Standard drinks or equivalent per week     Comment: social  . Drug Use: No  . Sexual Activity: Not on file   Other Topics Concern  . Not on file   Social History Narrative    Current Outpatient Prescriptions on File Prior to Visit  Medication Sig Dispense Refill  . acetic acid (VOSOL) 2 % otic solution 5 drops in affected ear  Tid 15 mL 5  . ANTARA 90 MG CAPS TAKE 1 CAPSULE BY MOUTH DAILY. 30 capsule 5  . atorvastatin (LIPITOR) 20 MG tablet TAKE 1 TABLET (20 MG TOTAL) BY MOUTH DAILY. 30 tablet 2  . indomethacin (INDOCIN) 25 MG capsule Take 1 capsule (25 mg total) by mouth 3  (three) times daily as needed. 30 capsule 0  . Insulin Detemir (LEVEMIR) 100 UNIT/ML Pen Inject 150 Units into the skin every morning. And pen needles 1/day 60 mL 11  . levothyroxine (SYNTHROID, LEVOTHROID) 100 MCG tablet TAKE 1 TABLET BY MOUTH ONCE DAILY BEFORE BREAKFAST 30 tablet 2  . losartan (COZAAR) 100 MG tablet TAKE 1 TABLET BY MOUTH EVERY DAY 90 tablet 1  . omeprazole (PRILOSEC) 40 MG capsule TAKE ONE CAPSULE BY MOUTH EVERY DAY 30 capsule 11   No current facility-administered medications on file prior to visit.    No Known Allergies  Family History  Problem Relation Age of Onset  . Colon cancer Mother   . Colon cancer Brother   . Hyperlipidemia Brother     Mother's side of the family  . Heart disease Sister   . Heart disease Mother   . Hypertension Mother     Mother's side of the family  . Diabetes Mother     Mother's side of the family  . Diabetes      father's family    BP 138/88 mmHg  Pulse 69  Temp(Src) 98.1 F (36.7 C) (Oral)  Ht 6' 1.5" (1.867 m)  Wt 280 lb (127.007 kg)  BMI 36.44 kg/m2  SpO2 97%  Review of Systems Denies LOC    Objective:   Physical Exam VITAL SIGNS:  See vs page GENERAL: no distress Pulses: dorsalis pedis intact bilat.   MSK: no deformity of the feet CV: trace bilat leg edema Skin:  no ulcer on the feet.  normal color and temp on the feet. Neuro: sensation is intact to touch on the feet.   Ext: There is bilateral onychomycosis of the toenails.   Lab Results  Component Value Date   HGBA1C 11.6* 09/10/2014       Assessment & Plan:  DM: glycemic control is worse. Noncompliance with cbg recording, persistent: I'll work around this as best I can, but this severely compromises our ability to achieve glycemic control.   Patient is advised the following: Patient Instructions  check your blood sugar twice a day.  vary the time of day when you check, between before the 3 meals, and at bedtime.  also check if you have symptoms of  your blood sugar being too high or too low.  please keep a record of the readings and bring it to your next appointment here.  You can write it on any piece of paper.  please call us sooner if your blood sugar goes below 70, or if you have a lot of readings over 200.   blood tests are being requested for you today.  We'll contact you with results. If it is high, we'll change to NPH (for safety, we'll have to start at a lower number of units).  Please come back for a follow-up appointment in 3 months.     addendum: i have sent a prescription to your pharmacy, to change to NPH

## 2014-09-10 NOTE — Patient Instructions (Addendum)
check your blood sugar twice a day.  vary the time of day when you check, between before the 3 meals, and at bedtime.  also check if you have symptoms of your blood sugar being too high or too low.  please keep a record of the readings and bring it to your next appointment here.  You can write it on any piece of paper.  please call us sooner if your blood sugar goes below 70, or if you have a lot of readings over 200.   blood tests are being requested for you today.  We'll contact you with results. If it is high, we'll change to NPH (for safety, we'll have to start at a lower number of units).  Please come back for a follow-up appointment in 3 months.

## 2014-09-16 ENCOUNTER — Telehealth: Payer: Self-pay | Admitting: Family Medicine

## 2014-09-16 ENCOUNTER — Telehealth: Payer: Self-pay | Admitting: Endocrinology

## 2014-09-16 ENCOUNTER — Other Ambulatory Visit: Payer: Self-pay

## 2014-09-16 DIAGNOSIS — E119 Type 2 diabetes mellitus without complications: Secondary | ICD-10-CM

## 2014-09-16 MED ORDER — "INSULIN SYRINGE 31G X 5/16"" 1 ML MISC": Status: DC

## 2014-09-16 NOTE — Telephone Encounter (Signed)
Patient stated that the needles for his medication has not sent to his pharmacy,  He has been calling and no one has responded, he is not happy please advise

## 2014-09-16 NOTE — Telephone Encounter (Signed)
Relation to pt: self  Call back number: [phone]   Reason for call:   Requesting a referral to another endocrinologist due to the fact Dr. Renato Shin changed pt Diabetes medication which now requires needles. Pt states its been very difficult getting needles from pharmacy because RX is not being sent. Please advise

## 2014-09-16 NOTE — Telephone Encounter (Signed)
I contacted the patient. Patient stated he has called our office multiple times and spoke with a receptionist and was advised by them the Rx for his insulin syringes would be sent. I apologized about this and advised the first office note was received today. Rx for syringes have been sent and patient has been notified.

## 2014-09-17 NOTE — Telephone Encounter (Signed)
Please review and advise     KP 

## 2014-09-17 NOTE — Telephone Encounter (Signed)
Ok to refer somewhere else but he can also call their office and just switch to Dr Cruzita Lederer

## 2014-09-17 NOTE — Telephone Encounter (Signed)
Ref placed.      KP 

## 2014-11-15 ENCOUNTER — Other Ambulatory Visit: Payer: Self-pay | Admitting: Endocrinology

## 2014-11-22 ENCOUNTER — Other Ambulatory Visit: Payer: Self-pay | Admitting: Family Medicine

## 2014-11-23 NOTE — Telephone Encounter (Signed)
Atorvastatin refill sent to pharmacy.  Pt is due for fasting follow up of cholesterol with Dr Etter Sjogren.  Please call pt to arrange appt.  Thanks!

## 2014-11-24 ENCOUNTER — Encounter: Payer: Self-pay | Admitting: Family Medicine

## 2014-11-24 NOTE — Telephone Encounter (Signed)
VM full, unable to leave msg, mailing letter

## 2014-12-11 ENCOUNTER — Ambulatory Visit (INDEPENDENT_AMBULATORY_CARE_PROVIDER_SITE_OTHER): Payer: BC Managed Care – PPO | Admitting: Endocrinology

## 2014-12-11 ENCOUNTER — Encounter: Payer: Self-pay | Admitting: Endocrinology

## 2014-12-11 VITALS — BP 138/80 | HR 74 | Temp 98.2°F | Ht 73.5 in | Wt 280.0 lb

## 2014-12-11 DIAGNOSIS — E119 Type 2 diabetes mellitus without complications: Secondary | ICD-10-CM

## 2014-12-11 DIAGNOSIS — Z23 Encounter for immunization: Secondary | ICD-10-CM

## 2014-12-11 LAB — POCT GLYCOSYLATED HEMOGLOBIN (HGB A1C): Hemoglobin A1C: 9.6

## 2014-12-11 NOTE — Patient Instructions (Addendum)
check your blood sugar twice a day.  vary the time of day when you check, between before the 3 meals, and at bedtime.  also check if you have symptoms of your blood sugar being too high or too low.  please keep a record of the readings and bring it to your next appointment here.  You can write it on any piece of paper.  please call us sooner if your blood sugar goes below 70, or if you have a lot of readings over 200.   On this type of insulin schedule, you should eat meals on a regular schedule (especially lunch).  If a meal is missed or significantly delayed, your blood sugar could go low.  To help you remember to take the insulin, put it next to something you do every day, such as the breakfast table or toothbrush.   Please come back for a follow-up appointment in 3 months.

## 2014-12-11 NOTE — Progress Notes (Signed)
Subjective:    Patient ID: Alex Rocha, male    DOB: [birth year], 59 y.o.   MRN: [id number]  HPI Pt returns for f/u of diabetes mellitus: DM type: Insulin-requiring type 2 Dx'ed: [id number] Complications: none Therapy: insulin since 2014 DKA: never Severe hypoglycemia: never. Pancreatitis: never.   Other: he chose a simple and inexpensive insulin regimen; he says his need to care for his wife, who has cancer, limits his ability to care for himself; he declines weight-loss surgery Interval history:  no cbg record, but states cbg's vary from 110-160.  There is no trend throughout the day.  He says he sometimes misses the insulin.  He seldom has hypoglycemia, and these episodes are mild.  Past Medical History  Diagnosis Date  . Hyperlipidemia   . Hypertension   . Diabetes mellitus   . Environmental allergies   . Kidney stones   . Colon polyps   . Low testosterone   . Hyperthyroidism     No past surgical history on file.  Social History   Social History  . Marital Status: Married    Spouse Name: N/A  . Number of Children: 2  . 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 Smoking status: Never Smoker   . Smokeless tobacco: Never Used  . Alcohol Use: 0.0 oz/week    0 Standard drinks or equivalent per week     Comment: social  . Drug Use: No  . Sexual Activity: Not on file   Other Topics Concern  . Not on file   Social History Narrative    Current Outpatient Prescriptions on File Prior to Visit  Medication Sig Dispense Refill  . acetic acid (VOSOL) 2 % otic solution 5 drops in affected ear  Tid 15 mL 5  . atorvastatin (LIPITOR) 20 MG tablet TAKE 1 TABLET (20 MG TOTAL) BY MOUTH DAILY. 30 tablet 1  . indomethacin (INDOCIN) 25 MG capsule Take 1 capsule (25 mg total) by mouth 3 (three) times daily as needed. 30 capsule 0  . insulin NPH Human (NOVOLIN N) 100 UNIT/ML injection Inject 1.2 mLs (120 Units total)  into the skin every morning. And syringes 2/day 40 mL 11  . Insulin Syringe-Needle U-100 (INSULIN SYRINGE 1CC/31GX5/16") 31G X 5/16" 1 ML MISC Use to inject insulin 2 times per day. 100 each 1  . levothyroxine (SYNTHROID, LEVOTHROID) 100 MCG tablet TAKE 1 TABLET BY MOUTH ONCE DAILY BEFORE BREAKFAST 30 tablet 2  . losartan (COZAAR) 100 MG tablet TAKE 1 TABLET BY MOUTH EVERY DAY 90 tablet 1  . omeprazole (PRILOSEC) 40 MG capsule TAKE ONE CAPSULE BY MOUTH EVERY DAY 30 capsule 11  . ANTARA 90 MG CAPS TAKE 1 CAPSULE BY MOUTH DAILY. (Patient not taking: Reported on 12/11/2014) 30 capsule 5   No current facility-administered medications on file prior to visit.    No Known Allergies  Family History  Problem Relation Age of Onset  . Colon cancer Mother   . Colon cancer Brother   . Hyperlipidemia Brother     Mother's side of the family  . Heart disease Sister   . Heart disease Mother   . Hypertension Mother     Mother's side of the family  . Diabetes Mother     Mother's side of the family  . Diabetes      father's family    BP 138/80 mmHg  Pulse 74  Temp(Src) 98.2 F (  36.8 C) (Oral)  Ht 6' 1.5" (1.867 m)  Wt 280 lb (127.007 kg)  BMI 36.44 kg/m2  SpO2 95%  Review of Systems Denies LOC.     Objective:   Physical Exam VITAL SIGNS:  See vs page GENERAL: no distress Pulses: dorsalis pedis intact bilat.   MSK: no deformity of the feet CV: no leg edema Skin:  no ulcer on the feet.  normal color and temp on the feet. Neuro: sensation is intact to touch on the feet Ext: There is bilateral onychomycosis of the toenails   a1c=10.5%    Assessment & Plan:  DM: persistently poor control. Noncompliance with cbg recording and taking insulin, persistent: I'll work around this as best I can. Obesity, persistent.  Diet is advised.    Patient is advised the following: Patient Instructions  check your blood sugar twice a day.  vary the time of day when you check, between before the 3  meals, and at bedtime.  also check if you have symptoms of your blood sugar being too high or too low.  please keep a record of the readings and bring it to your next appointment here.  You can write it on any piece of paper.  please call us sooner if your blood sugar goes below 70, or if you have a lot of readings over 200.   On this type of insulin schedule, you should eat meals on a regular schedule (especially lunch).  If a meal is missed or significantly delayed, your blood sugar could go low.  To help you remember to take the insulin, put it next to something you do every day, such as the breakfast table or toothbrush.   Please come back for a follow-up appointment in 3 months.

## 2014-12-29 ENCOUNTER — Emergency Department (HOSPITAL_BASED_OUTPATIENT_CLINIC_OR_DEPARTMENT_OTHER)
Admission: EM | Admit: 2014-12-29 | Discharge: 2014-12-29 | Disposition: A | Discharge status: Home / Self Care | Payer: BC Managed Care – PPO | Attending: Emergency Medicine | Admitting: Emergency Medicine

## 2014-12-29 ENCOUNTER — Encounter (HOSPITAL_BASED_OUTPATIENT_CLINIC_OR_DEPARTMENT_OTHER): Payer: Self-pay | Admitting: *Deleted

## 2014-12-29 DIAGNOSIS — E119 Type 2 diabetes mellitus without complications: Secondary | ICD-10-CM | POA: Insufficient documentation

## 2014-12-29 DIAGNOSIS — R55 Syncope and collapse: Secondary | ICD-10-CM

## 2014-12-29 DIAGNOSIS — Z794 Long term (current) use of insulin: Secondary | ICD-10-CM | POA: Insufficient documentation

## 2014-12-29 DIAGNOSIS — I10 Essential (primary) hypertension: Secondary | ICD-10-CM | POA: Diagnosis not present

## 2014-12-29 DIAGNOSIS — E785 Hyperlipidemia, unspecified: Secondary | ICD-10-CM | POA: Insufficient documentation

## 2014-12-29 DIAGNOSIS — R42 Dizziness and giddiness: Secondary | ICD-10-CM | POA: Diagnosis present

## 2014-12-29 DIAGNOSIS — Z79899 Other long term (current) drug therapy: Secondary | ICD-10-CM | POA: Diagnosis not present

## 2014-12-29 DIAGNOSIS — Z87442 Personal history of urinary calculi: Secondary | ICD-10-CM | POA: Insufficient documentation

## 2014-12-29 DIAGNOSIS — Z86010 Personal history of colonic polyps: Secondary | ICD-10-CM | POA: Diagnosis not present

## 2014-12-29 DIAGNOSIS — E0590 Thyrotoxicosis, unspecified without thyrotoxic crisis or storm: Secondary | ICD-10-CM | POA: Diagnosis not present

## 2014-12-29 LAB — CBC WITH DIFFERENTIAL/PLATELET
Basophils Absolute: 0 10*3/uL (ref 0.0–0.1)
Basophils Relative: 0 % (ref 0–1)
Eosinophils Absolute: 0.2 10*3/uL (ref 0.0–0.7)
Eosinophils Relative: 2 % (ref 0–5)
HCT: 41.3 % (ref 39.0–52.0)
Hemoglobin: 13.8 g/dL (ref 13.0–17.0)
Lymphocytes Relative: 39 % (ref 12–46)
Lymphs Abs: 2.6 10*3/uL (ref 0.7–4.0)
MCH: 30.7 pg (ref 26.0–34.0)
MCHC: 33.4 g/dL (ref 30.0–36.0)
MCV: 92 fL (ref 78.0–100.0)
Monocytes Absolute: 0.7 10*3/uL (ref 0.1–1.0)
Monocytes Relative: 10 % (ref 3–12)
Neutro Abs: 3.2 10*3/uL (ref 1.7–7.7)
Neutrophils Relative %: 49 % (ref 43–77)
Platelets: 206 10*3/uL (ref 150–400)
RBC: 4.49 MIL/uL (ref 4.22–5.81)
RDW: 13.4 % (ref 11.5–15.5)
WBC: 6.7 10*3/uL (ref 4.0–10.5)

## 2014-12-29 LAB — COMPREHENSIVE METABOLIC PANEL
ALT: 38 U/L (ref 17–63)
AST: 34 U/L (ref 15–41)
Albumin: 4.4 g/dL (ref 3.5–5.0)
Alkaline Phosphatase: 66 U/L (ref 38–126)
Anion gap: 8 (ref 5–15)
BUN: 12 mg/dL (ref 6–20)
CO2: 26 mmol/L (ref 22–32)
Calcium: 9.7 mg/dL (ref 8.9–10.3)
Chloride: 105 mmol/L (ref 101–111)
Creatinine, Ser: 0.97 mg/dL (ref 0.61–1.24)
GFR calc Af Amer: >60 mL/min (ref >60)
GFR calc non Af Amer: >60 mL/min (ref >60)
Glucose, Bld: 202 mg/dL — ABNORMAL HIGH (ref 65–99)
Potassium: 4.1 mmol/L (ref 3.5–5.1)
Sodium: 139 mmol/L (ref 135–145)
Total Bilirubin: 0.7 mg/dL (ref 0.3–1.2)
Total Protein: 7.5 g/dL (ref 6.5–8.1)

## 2014-12-29 NOTE — ED Notes (Signed)
Pt amb to triage with quick steady gait in nad. Pt reports intermittent dizzy episodes x 2 weeks, last happened earlier today. Pt states "I just thought I should get it checked out..." denies any c/o at this time, "I feel fine now."

## 2014-12-29 NOTE — ED Notes (Signed)
MD at bedside. 

## 2014-12-29 NOTE — ED Provider Notes (Signed)
CSN: [id number]     Arrival date & time 12/29/14  1200 History   First MD Initiated Contact with Patient 12/29/14 1242     Chief Complaint  Patient presents with  . Dizziness     (Consider location/radiation/quality/duration/timing/severity/associated sxs/prior Treatment) HPI Comments: 59 y.o. Male with history of IDDM, hypercholesterolemia, hypothyroidism, HTN presents for episodes of light headedness.  The patient reports that over the last 2 weeks he has had very short episodes of feeling weak and like he might pass out.  He says that they seem to occur randomly and that he has had them at rest as well as with activity.  No chest pain or shortness of breath associated.  He thinks he may feel like his heart is beating quickly during the episodes.  When the episode ends he returns to his normal self without any residual symptoms.  He denies headache, changes in vision, nausea, vomiting with the episodes.  No diaphoresis.  No leg swelling or pain.  The patient denies any changes in diet.  No recent medication changes.  His blood glucose has been running on the higher side for him in the 200s.  He does report that he has not been sleeping well or much and states that he has been under a great deal of stress as his wife is a cancer patient.  The patient reports that he has been able to do all his daily activities without issue.  No family history of sudden death.  He reports many years ago he had an Echo and stress test that were normal.  Reports he currently feels fine and does not have any symptoms.   Past Medical History  Diagnosis Date  . Hyperlipidemia   . Hypertension   . Diabetes mellitus   . Environmental allergies   . Kidney stones   . Colon polyps   . Low testosterone   . Hyperthyroidism    History reviewed. No pertinent past surgical history. Family History  Problem Relation Age of Onset  . Colon cancer Mother   . Colon cancer Brother   . Hyperlipidemia Brother     Mother's  side of the family  . Heart disease Sister   . Heart disease Mother   . Hypertension Mother     Mother's side of the family  . Diabetes Mother     Mother's side of the family  . Diabetes      father's family   Social History  Substance Use Topics  . Smoking status: Never Smoker   . Smokeless tobacco: Never Used  . Alcohol Use: 0.0 oz/week    0 Standard drinks or equivalent per week     Comment: social    Review of Systems  Constitutional: Negative for fever, chills, appetite change and fatigue.  HENT: Negative for congestion, postnasal drip and rhinorrhea.   Eyes: Negative for pain, redness and visual disturbance.  Respiratory: Negative for cough, chest tightness and shortness of breath.   Cardiovascular: Positive for palpitations. Negative for chest pain and leg swelling.  Gastrointestinal: Negative for nausea, vomiting, abdominal pain, diarrhea and constipation.  Genitourinary: Negative for dysuria, urgency and frequency.  Musculoskeletal: Negative for myalgias and back pain.  Skin: Negative for rash and wound.  Neurological: Positive for weakness (intermittent transient 30 second or less episodes) and light-headedness (intermittent transient 30 second or less episodes). Negative for dizziness, seizures, syncope, numbness and headaches.  Hematological: Negative for adenopathy. Does not bruise/bleed easily.      Allergies  Review of patient's allergies indicates no known allergies.  Home Medications   Prior to Admission medications   Medication Sig Start Date End Date Taking? Authorizing Provider  acetic acid (VOSOL) 2 % otic solution 5 drops in affected ear  Tid 02/19/13   Yvonne R [name], DO  ANTARA 90 MG CAPS TAKE 1 CAPSULE BY MOUTH DAILY. Patient not taking: Reported on 12/11/2014 03/26/14   Rosalita Chessman, DO  atorvastatin (LIPITOR) 20 MG tablet TAKE 1 TABLET (20 MG TOTAL) BY MOUTH DAILY. 11/23/14   Alferd Apa [name], DO  insulin NPH Human (NOVOLIN N) 100 UNIT/ML injection  Inject 1.2 mLs (120 Units total) into the skin every morning. And syringes 2/day 09/10/14   Renato Shin, MD  Insulin Syringe-Needle U-100 (INSULIN SYRINGE 1CC/31GX5/16") 31G X 5/16" 1 ML MISC Use to inject insulin 2 times per day. 09/16/14   Renato Shin, MD  levothyroxine (SYNTHROID, LEVOTHROID) 100 MCG tablet TAKE 1 TABLET BY MOUTH ONCE DAILY BEFORE BREAKFAST 08/28/14   Renato Shin, MD  losartan (COZAAR) 100 MG tablet TAKE 1 TABLET BY MOUTH EVERY DAY 09/03/14   Rosalita Chessman, DO  omeprazole (PRILOSEC) 40 MG capsule TAKE ONE CAPSULE BY MOUTH EVERY DAY 09/07/14   Alferd Apa [name], DO   BP 129/52 mmHg  Pulse 70  Temp(Src) 98.3 F (36.8 C) (Oral)  Resp 17  Ht 6\' 2"  (1.88 m)  Wt 285 lb (129.275 kg)  BMI 36.58 kg/m2  SpO2 97% Physical Exam  Constitutional: He is oriented to person, place, and time. He appears well-developed and well-nourished. No distress.  HENT:  Head: Normocephalic and atraumatic.  Right Ear: External ear normal.  Left Ear: External ear normal.  Mouth/Throat: Oropharynx is clear and moist. No oropharyngeal exudate.  Eyes: EOM are normal. Pupils are equal, round, and reactive to light.  Neck: Normal range of motion. Neck supple.  Cardiovascular: Normal rate, regular rhythm, normal heart sounds and intact distal pulses.   No murmur heard. Pulmonary/Chest: Effort normal. No respiratory distress. He has no wheezes. He has no rales.  Abdominal: Soft. He exhibits no distension. There is no tenderness. There is no rebound and no guarding.  Musculoskeletal: Normal range of motion. He exhibits no edema or tenderness.  Neurological: He is alert and oriented to person, place, and time. He has normal strength. No cranial nerve deficit or sensory deficit. He exhibits normal muscle tone. Coordination and gait normal.  Skin: Skin is warm and dry. No rash noted. He is not diaphoretic.  Vitals reviewed.   ED Course  Procedures (including critical care time) Labs Review Labs Reviewed   COMPREHENSIVE METABOLIC PANEL - Abnormal; Notable for the following:    Glucose, Bld 202 (*)    All other components within normal limits  CBC WITH DIFFERENTIAL/PLATELET    Imaging Review No results found. I have personally reviewed and evaluated these images and lab results as part of my medical decision-making.   EKG Interpretation   Date/Time:  Tuesday December 29 2014 13:45:13 EDT Ventricular Rate:  74 PR Interval:  194 QRS Duration: 86 QT Interval:  364 QTC Calculation: 404 R Axis:   68 Text Interpretation:  Normal sinus rhythm Normal ECG No significant change  since last tracing Confirmed by NGUYEN, EMILY ([id number]) on 12/29/2014 2:05:52  PM      MDM  Patient was seen and evaluated in stable condition.  Normal examination.  EKG normal and unremarkable compared to previous.  Patient asymptomatic at time of evaluation.  Patient said  he would be able to follow up with his primary care physician.  Patient was discharged home in stable condition with instruction to follow up with his PCP as soon as possible.  He was given strict return precautions and educated on the multiple possible causes of near syncope.  He expressed understanding and agreement with plan of care and was discharged home in stable condition. Final diagnoses:  Near syncope    1. Near syncope    Harvel Quale, MD 12/29/14 [phone]

## 2014-12-29 NOTE — Discharge Instructions (Signed)
Near-Syncope Near-syncope (commonly known as near fainting) is sudden weakness, dizziness, or feeling like you might pass out. During an episode of near-syncope, you may also develop pale skin, have tunnel vision, or feel sick to your stomach (nauseous). Near-syncope may occur when getting up after sitting or while standing for a long time. It is caused by a sudden decrease in blood flow to the brain. This decrease can result from various causes or triggers, most of which are not serious. However, because near-syncope can sometimes be a sign of something serious, a medical evaluation is required. The specific cause is often not determined. HOME CARE INSTRUCTIONS  Monitor your condition for any changes. The following actions may help to alleviate any discomfort you are experiencing:  Have someone stay with you until you feel stable.  Lie down right away and prop your feet up if you start feeling like you might faint. Breathe deeply and steadily. Wait until all the symptoms have passed. Most of these episodes last only a few minutes. You may feel tired for several hours.   Drink enough fluids to keep your urine clear or pale yellow.   If you are taking blood pressure or heart medicine, get up slowly when seated or lying down. Take several minutes to sit and then stand. This can reduce dizziness.  Follow up with your health care provider as directed. SEEK IMMEDIATE MEDICAL CARE IF:   You have a severe headache.   You have unusual pain in the chest, abdomen, or back.   You are bleeding from the mouth or rectum, or you have black or tarry stool.   You have an irregular or very fast heartbeat.   You have repeated fainting or have seizure-like jerking during an episode.   You faint when sitting or lying down.   You have confusion.   You have difficulty walking.   You have severe weakness.   You have vision problems.  MAKE SURE YOU:   Understand these instructions.  Will  watch your condition.  Will get help right away if you are not doing well or get worse. Document Released: 04/03/2005 Document Revised: 04/08/2013 Document Reviewed: 09/06/2012 [employer] Patient Information 2015 [employer]. This information is not intended to replace advice given to you by your health care provider. Make sure you discuss any questions you have with your health care provider.  

## 2014-12-29 NOTE — ED Notes (Signed)
Pt stating that he has to get back to court at 2:00 pm. Pt asking if his lab work could be sent upstairs to his PMD. Advised patient that they could send for the medical records, but we would be unable to provide him with his results today unless he stayed here in the ED. Pt sts that he needs to get back to the court. Advised patient that he would have to sign out AMA at this time if he wanted to leave at this time.

## 2015-01-02 ENCOUNTER — Other Ambulatory Visit: Payer: Self-pay | Admitting: Endocrinology

## 2015-01-04 ENCOUNTER — Telehealth: Payer: Self-pay | Admitting: Endocrinology

## 2015-01-04 LAB — HM DIABETES EYE EXAM

## 2015-01-04 NOTE — Telephone Encounter (Signed)
Pt needs refills for levothyroxine called in to [facility] on [address]

## 2015-01-04 NOTE — Telephone Encounter (Signed)
Rx sent electronically per pt's request.

## 2015-01-11 ENCOUNTER — Ambulatory Visit (INDEPENDENT_AMBULATORY_CARE_PROVIDER_SITE_OTHER): Payer: BC Managed Care – PPO | Admitting: Family Medicine

## 2015-01-11 ENCOUNTER — Encounter: Payer: Self-pay | Admitting: Family Medicine

## 2015-01-11 VITALS — BP 128/74 | HR 78 | Temp 99.6°F | Resp 18 | Ht 74.0 in | Wt 292.0 lb

## 2015-01-11 DIAGNOSIS — Z23 Encounter for immunization: Secondary | ICD-10-CM

## 2015-01-11 DIAGNOSIS — R42 Dizziness and giddiness: Secondary | ICD-10-CM

## 2015-01-11 DIAGNOSIS — Z0000 Encounter for general adult medical examination without abnormal findings: Secondary | ICD-10-CM

## 2015-01-11 DIAGNOSIS — E785 Hyperlipidemia, unspecified: Secondary | ICD-10-CM | POA: Diagnosis not present

## 2015-01-11 DIAGNOSIS — I10 Essential (primary) hypertension: Secondary | ICD-10-CM | POA: Diagnosis not present

## 2015-01-11 NOTE — Progress Notes (Signed)
Patient ID: Alex Rocha, male   DOB: 12/09/1955, 59 y.o.   MRN: [id number]   Subjective:    Patient ID: Alex Rocha, male    DOB: [birth year], 59 y.o.   MRN: [id number]  Chief Complaint  Patient presents with  . Follow-up    Pt went to ED for chest discomfort 2 wks ago    HPI Patient is in today for palpitations and light headedness.  He states he felt better after stopping the yohimbe.  No other complaints.    Past Medical History  Diagnosis Date  . Hyperlipidemia   . Hypertension   . Diabetes mellitus   . Environmental allergies   . Kidney stones   . Colon polyps   . Low testosterone   . Hyperthyroidism     No past surgical history on file.  Family History  Problem Relation Age of Onset  . Colon cancer Mother   . Colon cancer Brother   . Hyperlipidemia Brother     Mother's side of the family  . Heart disease Sister   . Heart disease Mother   . Hypertension Mother     Mother's side of the family  . Diabetes Mother     Mother's side of the family  . Diabetes      father's family    Social History   Social History  . Marital Status: Married    Spouse Name: N/A  . Number of Children: 2  . 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 Smoking status: Never Smoker   . Smokeless tobacco: Never Used  . Alcohol Use: 0.0 oz/week    0 Standard drinks or equivalent per week     Comment: social  . Drug Use: No  . Sexual Activity: Not on file   Other Topics Concern  . Not on file   Social History Narrative    Outpatient Prescriptions Prior to Visit  Medication Sig Dispense Refill  . acetic acid (VOSOL) 2 % otic solution 5 drops in affected ear  Tid 15 mL 5  . atorvastatin (LIPITOR) 20 MG tablet TAKE 1 TABLET (20 MG TOTAL) BY MOUTH DAILY. 30 tablet 1  . insulin NPH Human (NOVOLIN N) 100 UNIT/ML injection Inject 1.2 mLs (120 Units total) into the skin every morning. And syringes 2/day 40 mL 11   . Insulin Syringe-Needle U-100 (INSULIN SYRINGE 1CC/31GX5/16") 31G X 5/16" 1 ML MISC Use to inject insulin 2 times per day. 100 each 1  . levothyroxine (SYNTHROID, LEVOTHROID) 100 MCG tablet TAKE 1 TABLET BY MOUTH ONCE DAILY BEFORE BREAKFAST 30 tablet 2  . losartan (COZAAR) 100 MG tablet TAKE 1 TABLET BY MOUTH EVERY DAY 90 tablet 1  . omeprazole (PRILOSEC) 40 MG capsule TAKE ONE CAPSULE BY MOUTH EVERY DAY 30 capsule 11  . ANTARA 90 MG CAPS TAKE 1 CAPSULE BY MOUTH DAILY. (Patient not taking: Reported on 12/11/2014) 30 capsule 5   No facility-administered medications prior to visit.    Allergies  Allergen Reactions  . [name] [Yohimbine] Palpitations    Review of Systems  Constitutional: Negative for fever and malaise/fatigue.  HENT: Negative for congestion.   Eyes: Negative for discharge.  Respiratory: Negative for shortness of breath.   Cardiovascular: Negative for chest pain, palpitations and leg swelling.  Gastrointestinal: Negative for nausea and abdominal pain.  Genitourinary: Negative for dysuria.  Musculoskeletal: Negative for falls.  Skin: Negative for rash.  Neurological: Negative  for loss of consciousness and headaches.  Endo/Heme/Allergies: Negative for environmental allergies.  Psychiatric/Behavioral: Negative for depression. The patient is not nervous/anxious.        Objective:    Physical Exam  Constitutional: He is oriented to person, place, and time. Vital signs are normal. He appears well-developed and well-nourished. He is sleeping.  HENT:  Head: Normocephalic and atraumatic.  Mouth/Throat: Oropharynx is clear and moist.  Eyes: EOM are normal. Pupils are equal, round, and reactive to light.  Neck: Normal range of motion. Neck supple. No thyromegaly present.  Cardiovascular: Normal rate and regular rhythm.   No murmur heard. Pulmonary/Chest: Effort normal and breath sounds normal. No respiratory distress. He has no wheezes. He has no rales. He exhibits no  tenderness.  Musculoskeletal: He exhibits no edema or tenderness.  Neurological: He is alert and oriented to person, place, and time.  Skin: Skin is warm and dry.  Psychiatric: He has a normal mood and affect. His behavior is normal. Judgment and thought content normal.  Nursing note and vitals reviewed.   BP 128/74 mmHg  Pulse 78  Temp(Src) 99.6 F (37.6 C) (Oral)  Resp 18  Ht 6\' 2"  (1.88 m)  Wt 292 lb (132.45 kg)  BMI 37.47 kg/m2  SpO2 97% Wt Readings from Last 3 Encounters:  01/11/15 292 lb (132.45 kg)  12/29/14 285 lb (129.275 kg)  12/11/14 280 lb (127.007 kg)     Lab Results  Component Value Date   WBC 6.7 12/29/2014   HGB 13.8 12/29/2014   HCT 41.3 12/29/2014   PLT 206 12/29/2014   GLUCOSE 202* 12/29/2014   CHOL 231* 04/24/2014   TRIG 551.0* 04/24/2014   HDL 30.10* 04/24/2014   LDLDIRECT 122.1 04/24/2014   ALT 38 12/29/2014   AST 34 12/29/2014   NA 139 12/29/2014   K 4.1 12/29/2014   CL 105 12/29/2014   CREATININE 0.97 12/29/2014   BUN 12 12/29/2014   CO2 26 12/29/2014   TSH 4.060 04/24/2014   HGBA1C 9.6 12/11/2014   MICROALBUR <0.7 06/12/2014    Lab Results  Component Value Date   TSH 4.060 04/24/2014   Lab Results  Component Value Date   WBC 6.7 12/29/2014   HGB 13.8 12/29/2014   HCT 41.3 12/29/2014   MCV 92.0 12/29/2014   PLT 206 12/29/2014   Lab Results  Component Value Date   NA 139 12/29/2014   K 4.1 12/29/2014   CO2 26 12/29/2014   GLUCOSE 202* 12/29/2014   BUN 12 12/29/2014   CREATININE 0.97 12/29/2014   BILITOT 0.7 12/29/2014   ALKPHOS 66 12/29/2014   AST 34 12/29/2014   ALT 38 12/29/2014   PROT 7.5 12/29/2014   ALBUMIN 4.4 12/29/2014   CALCIUM 9.7 12/29/2014   ANIONGAP 8 12/29/2014   GFR 66.80 04/24/2014   Lab Results  Component Value Date   CHOL 231* 04/24/2014   Lab Results  Component Value Date   HDL 30.10* 04/24/2014   No results found for: [facility] Lab Results  Component Value Date   TRIG 551.0* 04/24/2014    Lab Results  Component Value Date   CHOLHDL 8 04/24/2014   Lab Results  Component Value Date   HGBA1C 9.6 12/11/2014       Assessment & Plan:   Problem List Items Addressed This Visit    Hyperlipidemia   Relevant Orders   Lipid panel   HTN (hypertension) - Primary   Relevant Orders   Comprehensive metabolic panel  Other Visit Diagnoses    Preventative health care        Relevant Orders    Hepatitis C antibody    Need for prophylactic vaccination with combined diphtheria-tetanus-pertussis (DTP) vaccine        Relevant Orders    Tdap vaccine greater than or equal to 7yo IM (Completed)    Need for prophylactic vaccination against Streptococcus pneumoniae (pneumococcus)        Relevant Orders    Pneumococcal polysaccharide vaccine 23-valent greater than or equal to 2yo subcutaneous/IM (Completed)       I have discontinued [name]. I am also having him maintain his acetic acid, losartan, omeprazole, insulin NPH Human, INSULIN SYRINGE 1CC/31GX5/16", atorvastatin, levothyroxine, and ANDROGEL PUMP.  Meds ordered this encounter  Medications  . ANDROGEL PUMP 20.25 MG/ACT (1.62%) GEL    Sig: APPLY 3 PUMP PRESSES ONCE DAILY AS DIRECTED    Refill:  Malcom, DO

## 2015-01-11 NOTE — Patient Instructions (Signed)

## 2015-01-11 NOTE — Progress Notes (Signed)
Pre visit review using our clinic review tool, if applicable. No additional management support is needed unless otherwise documented below in the visit note. 

## 2015-01-11 NOTE — Assessment & Plan Note (Signed)
Resolved after stopping yohimbe rto prn

## 2015-01-12 LAB — COMPREHENSIVE METABOLIC PANEL
ALT: 44 U/L (ref 0–53)
AST: 38 U/L — ABNORMAL HIGH (ref 0–37)
Albumin: 4.1 g/dL (ref 3.5–5.2)
Alkaline Phosphatase: 53 U/L (ref 39–117)
BUN: 15 mg/dL (ref 6–23)
CO2: 26 mEq/L (ref 19–32)
Calcium: 9.1 mg/dL (ref 8.4–10.5)
Chloride: 108 mEq/L (ref 96–112)
Creatinine, Ser: 0.97 mg/dL (ref 0.40–1.50)
GFR: 102.59 mL/min (ref >60.00)
Glucose, Bld: 91 mg/dL (ref 70–99)
Potassium: 3.9 mEq/L (ref 3.5–5.1)
Sodium: 142 mEq/L (ref 135–145)
Total Bilirubin: 0.4 mg/dL (ref 0.2–1.2)
Total Protein: 6.9 g/dL (ref 6.0–8.3)

## 2015-01-12 LAB — LIPID PANEL
Cholesterol: 129 mg/dL (ref 0–200)
HDL: 28.9 mg/dL — ABNORMAL LOW (ref >39.00)
NonHDL: 99.94
Total CHOL/HDL Ratio: 4
Triglycerides: 275 mg/dL — ABNORMAL HIGH (ref 0.0–149.0)
VLDL: 55 mg/dL — ABNORMAL HIGH (ref 0.0–40.0)

## 2015-01-12 LAB — LDL CHOLESTEROL, DIRECT: Direct LDL: 75 mg/dL

## 2015-01-12 LAB — HEPATITIS C ANTIBODY: HCV Ab: NEGATIVE

## 2015-01-19 ENCOUNTER — Other Ambulatory Visit: Payer: Self-pay | Admitting: Family Medicine

## 2015-01-20 ENCOUNTER — Telehealth: Payer: Self-pay | Admitting: Family Medicine

## 2015-01-20 NOTE — Telephone Encounter (Signed)
Calling again °

## 2015-01-20 NOTE — Telephone Encounter (Signed)
Left message for pt to return my call. See lab result note.

## 2015-01-20 NOTE — Telephone Encounter (Signed)
Caller name:Nachman Boettner Relationship to patient:self Can be reached:[phone] Pharmacy:  Reason for call:returning call

## 2015-01-21 MED ORDER — FENOFIBRATE 160 MG PO TABS: 160.0000 mg | ORAL_TABLET | Freq: Every day | ORAL | Status: DC

## 2015-01-21 NOTE — Telephone Encounter (Signed)
Cholesterol--- LDL goal < 70, HDL >40, TG < 150. Diet and exercise will increase HDL and decrease LDL and TG. Fish, Fish Oil, Flaxseed oil will also help increase the HDL and decrease Triglycerides.  Recheck labs in 3 months--- add fenofibrate 160 mg #30 1 po qd , 2 refills  Lipid, cmp .   Spoke with patient and he verbalized understanding, he has agreed to start the fenofibrate 160 mg, Rx  Has been send in and the labs have been mailed.     KP

## 2015-02-03 ENCOUNTER — Encounter: Payer: Self-pay | Admitting: Family Medicine

## 2015-03-15 ENCOUNTER — Encounter: Payer: Self-pay | Admitting: Endocrinology

## 2015-03-15 ENCOUNTER — Ambulatory Visit (INDEPENDENT_AMBULATORY_CARE_PROVIDER_SITE_OTHER): Payer: BC Managed Care – PPO | Admitting: Endocrinology

## 2015-03-15 VITALS — BP 138/82 | HR 85 | Temp 98.9°F | Ht 74.0 in | Wt 299.0 lb

## 2015-03-15 DIAGNOSIS — E042 Nontoxic multinodular goiter: Secondary | ICD-10-CM | POA: Diagnosis not present

## 2015-03-15 DIAGNOSIS — E119 Type 2 diabetes mellitus without complications: Secondary | ICD-10-CM | POA: Diagnosis not present

## 2015-03-15 LAB — POCT GLYCOSYLATED HEMOGLOBIN (HGB A1C): Hemoglobin A1C: 8.8

## 2015-03-15 MED ORDER — INSULIN NPH (HUMAN) (ISOPHANE) 100 UNIT/ML ~~LOC~~ SUSP: SUBCUTANEOUS | Status: DC

## 2015-03-15 NOTE — Patient Instructions (Addendum)
Please change the insulin to 110 units each morning and 20 units each evening. check your blood sugar twice a day.  vary the time of day when you check, between before the 3 meals, and at bedtime.  also check if you have symptoms of your blood sugar being too high or too low.  please keep a record of the readings and bring it to your next appointment here.  You can write it on any piece of paper.  please call us sooner if your blood sugar goes below 70, or if you have a lot of readings over 200.   On this type of insulin schedule, you should eat meals on a regular schedule (especially lunch).  If a meal is missed or significantly delayed, your blood sugar could go low.  To help you remember to take the insulin, put it next to something you do every day, such as the breakfast table or toothbrush.   Please come back for a follow-up appointment in 3 months.   Let's recheck the thyroid ultrasound.  you will receive a phone call, about a day and time for an appointment.

## 2015-03-15 NOTE — Progress Notes (Signed)
Subjective:    Patient ID: Alex Rocha, male    DOB: June 03, 1955, 59 y.o.   MRN: [id number]  HPI  The state of at least three ongoing medical problems is addressed today, with interval history of each noted here: Pt returns for f/u of diabetes mellitus: DM type: Insulin-requiring type 2 Dx'ed: [id number] Complications: none Therapy: insulin since 2014 DKA: never Severe hypoglycemia: never.   Pancreatitis: never.   Other: he chose a simple and inexpensive insulin regimen; he says his need to care for his wife, who has cancer, limits his ability to care for himself; he declines weight-loss surgery.  Interval history:  no cbg record, but states cbg's vary from 80-220.  He says it is highest in am.   He says he misses the insulin almost once per week.   Pt also returns for f/u of multinodular goiter (dx'ed 2014; bx was benign; pt then had i-131 rx).  he does not notice the goiter.   Hyperthyroidism: he feels no different since I-131 rx Past Medical History  Diagnosis Date  . Hyperlipidemia   . Hypertension   . Diabetes mellitus   . Environmental allergies   . Kidney stones   . Colon polyps   . Low testosterone   . Hyperthyroidism     No past surgical history on file.  Social History   Social History  . Marital Status: Married    Spouse Name: N/A  . Number of Children: 2  . 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 Smoking status: Never Smoker   . Smokeless tobacco: Never Used  . Alcohol Use: 0.0 oz/week    0 Standard drinks or equivalent per week     Comment: social  . Drug Use: No  . Sexual Activity: Not on file   Other Topics Concern  . Not on file   Social History Narrative    Current Outpatient Prescriptions on File Prior to Visit  Medication Sig Dispense Refill  . acetic acid (VOSOL) 2 % otic solution 5 drops in affected ear  Tid 15 mL 5  . ANDROGEL PUMP 20.25 MG/ACT (1.62%) GEL APPLY 3 PUMP  PRESSES ONCE DAILY AS DIRECTED  3  . atorvastatin (LIPITOR) 20 MG tablet TAKE 1 TABLET BY MOUTH EVERY DAY 30 tablet 5  . fenofibrate 160 MG tablet Take 1 tablet (160 mg total) by mouth daily. 30 tablet 2  . Insulin Syringe-Needle U-100 (INSULIN SYRINGE 1CC/31GX5/16") 31G X 5/16" 1 ML MISC Use to inject insulin 2 times per day. 100 each 1  . levothyroxine (SYNTHROID, LEVOTHROID) 100 MCG tablet TAKE 1 TABLET BY MOUTH ONCE DAILY BEFORE BREAKFAST 30 tablet 2  . losartan (COZAAR) 100 MG tablet TAKE 1 TABLET BY MOUTH EVERY DAY 90 tablet 1  . omeprazole (PRILOSEC) 40 MG capsule TAKE ONE CAPSULE BY MOUTH EVERY DAY 30 capsule 11   No current facility-administered medications on file prior to visit.    Allergies  Allergen Reactions  . [name] [Yohimbine] Palpitations    Family History  Problem Relation Age of Onset  . Colon cancer Mother   . Colon cancer Brother   . Hyperlipidemia Brother     Mother's side of the family  . Heart disease Sister   . Heart disease Mother   . Hypertension Mother     Mother's side of the family  . Diabetes Mother     Mother's side of the family  .  Diabetes      father's family    BP 138/82 mmHg  Pulse 85  Temp(Src) 98.9 F (37.2 C) (Oral)  Ht 6\' 2"  (1.88 m)  Wt 299 lb (135.626 kg)  BMI 38.37 kg/m2  SpO2 93%  Review of Systems He denies hypoglycemia and weight change    Objective:   Physical Exam VITAL SIGNS:  See vs page GENERAL: no distress NECK: There is no palpable thyroid enlargement.  No thyroid nodule is palpable.  No palpable lymphadenopathy at the anterior neck. SKIN:  Insulin injection sites at the anterior abdomen are normal  Lab Results  Component Value Date   TSH 4.060 04/24/2014   T4TOTAL 6.0 04/24/2014    A1c=8.8%    Assessment & Plan:  DM: the pattern of his cbg's indicates he needs to take the NPH insulin BID. Noncompliance with cbg recording and insulin dosing: I'll work around this as best I can Goiter: due for recheck  of [place].   Hyperthyroidism: resolved with I-131 rx, but he is at risk for recurrent abnormal TFT in the future, so we'll follow.   Patient is advised the following: Patient Instructions  Please change the insulin to 110 units each morning and 20 units each evening. check your blood sugar twice a day.  vary the time of day when you check, between before the 3 meals, and at bedtime.  also check if you have symptoms of your blood sugar being too high or too low.  please keep a record of the readings and bring it to your next appointment here.  You can write it on any piece of paper.  please call us sooner if your blood sugar goes below 70, or if you have a lot of readings over 200.   On this type of insulin schedule, you should eat meals on a regular schedule (especially lunch).  If a meal is missed or significantly delayed, your blood sugar could go low.  To help you remember to take the insulin, put it next to something you do every day, such as the breakfast table or toothbrush.   Please come back for a follow-up appointment in 3 months.   Let's recheck the thyroid ultrasound.  you will receive a phone call, about a day and time for an appointment.

## 2015-03-19 ENCOUNTER — Ambulatory Visit
Admission: RE | Admit: 2015-03-19 | Discharge: 2015-03-19 | Disposition: A | Discharge status: Home / Self Care | Payer: BC Managed Care – PPO | Source: Ambulatory Visit | Attending: Endocrinology | Admitting: Endocrinology

## 2015-03-19 DIAGNOSIS — E042 Nontoxic multinodular goiter: Secondary | ICD-10-CM

## 2015-03-24 ENCOUNTER — Other Ambulatory Visit: Payer: Self-pay | Admitting: Endocrinology

## 2015-03-28 ENCOUNTER — Other Ambulatory Visit: Payer: Self-pay | Admitting: Endocrinology

## 2015-04-10 ENCOUNTER — Other Ambulatory Visit: Payer: Self-pay | Admitting: Family Medicine

## 2015-05-08 ENCOUNTER — Other Ambulatory Visit: Payer: Self-pay | Admitting: Family Medicine

## 2015-05-10 NOTE — Telephone Encounter (Signed)
Please offer this patient a Physical exam.     KP

## 2015-05-11 ENCOUNTER — Other Ambulatory Visit: Payer: Self-pay | Admitting: Family Medicine

## 2015-05-11 NOTE — Telephone Encounter (Signed)
Left message for patient to call the office to schedule CPE with Dr. Etter Sjogren

## 2015-05-12 ENCOUNTER — Telehealth: Payer: Self-pay | Admitting: Family Medicine

## 2015-05-12 NOTE — Telephone Encounter (Signed)
Patient will call back to schedule OV with Dr. Etter Sjogren with fasting labs

## 2015-05-24 ENCOUNTER — Other Ambulatory Visit: Payer: Self-pay

## 2015-05-24 MED ORDER — LEVOTHYROXINE SODIUM 100 MCG PO TABS: ORAL_TABLET | ORAL | Status: DC

## 2015-06-15 ENCOUNTER — Ambulatory Visit: Payer: BC Managed Care – PPO | Admitting: Endocrinology

## 2015-06-16 ENCOUNTER — Telehealth: Payer: Self-pay | Admitting: Endocrinology

## 2015-06-16 NOTE — Telephone Encounter (Signed)
Patient no showed today's appt. Please advise on how to follow up. °A. No follow up necessary. °B. Follow up urgent. Contact patient immediately. °C. Follow up necessary. Contact patient and schedule visit in ___ days. °D. Follow up advised. Contact patient and schedule visit in ____weeks. ° °

## 2015-06-16 NOTE — Telephone Encounter (Signed)
Please come back for a follow-up appointment in 2 weeks

## 2015-06-16 NOTE — Telephone Encounter (Signed)
LM for pt to call back.

## 2015-06-16 NOTE — Telephone Encounter (Signed)
Caitlin, Could you please contact the pt and reschedule. Thanks!

## 2015-06-17 ENCOUNTER — Other Ambulatory Visit: Payer: Self-pay | Admitting: Family Medicine

## 2015-06-18 ENCOUNTER — Encounter: Payer: Self-pay | Admitting: Endocrinology

## 2015-06-18 ENCOUNTER — Ambulatory Visit (INDEPENDENT_AMBULATORY_CARE_PROVIDER_SITE_OTHER): Payer: BC Managed Care – PPO | Admitting: Endocrinology

## 2015-06-18 VITALS — BP 138/84 | HR 85 | Temp 98.3°F | Ht 74.0 in | Wt 297.0 lb

## 2015-06-18 DIAGNOSIS — Z794 Long term (current) use of insulin: Secondary | ICD-10-CM | POA: Diagnosis not present

## 2015-06-18 DIAGNOSIS — E890 Postprocedural hypothyroidism: Secondary | ICD-10-CM | POA: Diagnosis not present

## 2015-06-18 DIAGNOSIS — E119 Type 2 diabetes mellitus without complications: Secondary | ICD-10-CM

## 2015-06-18 LAB — POCT GLYCOSYLATED HEMOGLOBIN (HGB A1C): Hemoglobin A1C: 9.2

## 2015-06-18 MED ORDER — INSULIN NPH (HUMAN) (ISOPHANE) 100 UNIT/ML ~~LOC~~ SUSP: SUBCUTANEOUS | Status: DC

## 2015-06-18 NOTE — Progress Notes (Signed)
Subjective:    Patient ID: Alex Rocha, male    DOB: December 21, 1955, 60 y.o.   MRN: [id number]  HPI The state of at least three ongoing medical problems is addressed today, with interval history of each noted here: Pt returns for f/u of diabetes mellitus: DM type: Insulin-requiring type 2 Dx'ed: 2000.  Complications: none.  Therapy: insulin since 2014 DKA: never.  Severe hypoglycemia: never.   Pancreatitis: never.   Other: he chose a simple and inexpensive insulin regimen; he says his need to care for his wife, who has cancer, limits his ability to care for himself; he declines weight-loss surgery.  Interval history:  no cbg record, but states cbg's are higher recently.  He says it is highest in am than later in the day.   He says he misses the insulin approx 1 dose per month. Pt also returns for f/u of multinodular goiter (dx'ed 2014; bx was benign; pt then had i-131 rx; f/u US showed nodules were smaller).  he does not notice the goiter.   Hyperthyroidism: he takes synthroid as rx'ed.   Past Medical History  Diagnosis Date  . Hyperlipidemia   . Hypertension   . Diabetes mellitus   . Environmental allergies   . Kidney stones   . Colon polyps   . Low testosterone   . Hyperthyroidism     No past surgical history on file.  Social History   Social History  . Marital Status: Married    Spouse Name: N/A  . Number of Children: 2  . 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 Smoking status: Never Smoker   . Smokeless tobacco: Never Used  . Alcohol Use: 0.0 oz/week    0 Standard drinks or equivalent per week     Comment: social  . Drug Use: No  . Sexual Activity: Not on file   Other Topics Concern  . Not on file   Social History Narrative    Current Outpatient Prescriptions on File Prior to Visit  Medication Sig Dispense Refill  . acetic acid (VOSOL) 2 % otic solution 5 drops in affected ear  Tid 15  mL 5  . atorvastatin (LIPITOR) 20 MG tablet TAKE 1 TABLET BY MOUTH EVERY DAY 30 tablet 5  . B-D INS SYR ULTRAFINE 1CC/31G 31G X 5/16" 1 ML MISC USE TO INJECT INSULIN 2 TIMES PER DAY. 100 each 1  . fenofibrate 160 MG tablet TAKE 1 TABLET (160 MG TOTAL) BY MOUTH DAILY. REPEAT LABS ARE DUE NOW 30 tablet 0  . levothyroxine (SYNTHROID, LEVOTHROID) 100 MCG tablet TAKE 1 TABLET BY MOUTH ONCE DAILY BEFORE BREAKFAST 90 tablet 2  . losartan (COZAAR) 100 MG tablet TAKE 1 TABLET BY MOUTH EVERY DAY 90 tablet 0  . omeprazole (PRILOSEC) 40 MG capsule TAKE ONE CAPSULE BY MOUTH EVERY DAY 30 capsule 11  . ANDROGEL PUMP 20.25 MG/ACT (1.62%) GEL Reported on 06/18/2015  3   No current facility-administered medications on file prior to visit.    Allergies  Allergen Reactions  . [name] [Yohimbine] Palpitations    Family History  Problem Relation Age of Onset  . Colon cancer Mother   . Colon cancer Brother   . Hyperlipidemia Brother     Mother's side of the family  . Heart disease Sister   . Heart disease Mother   . Hypertension Mother     Mother's side of the family  .  Diabetes Mother     Mother's side of the family  . Diabetes      father's family    BP 138/84 mmHg  Pulse 85  Temp(Src) 98.3 F (36.8 C) (Oral)  Ht 6\' 2"  (1.88 m)  Wt 297 lb (134.718 kg)  BMI 38.12 kg/m2  SpO2 97%  Review of Systems He denies hypoglycemia.      Objective:   Physical Exam VITAL SIGNS:  See vs page GENERAL: no distress Pulses: dorsalis pedis intact bilat.   MSK: no deformity of the feet CV: trace bilat leg edema Skin:  no ulcer on the feet.  normal color and temp on the feet. Neuro: sensation is intact to touch on the feet   A1c=9.2% Lab Results  Component Value Date   TSH 2.50 06/18/2015   T4TOTAL 6.0 04/24/2014      Assessment & Plan:  DM: glycemic control is worse. 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  Please continue the same medication,  Patient is advised the following: Patient  Instructions  Please increase the insulin to 110 units each morning and 40 units each evening.  blood and urine tests are requested for you today.  We'll let you know about the results. check your blood sugar twice a day.  vary the time of day when you check, between before the 3 meals, and at bedtime.  also check if you have symptoms of your blood sugar being too high or too low.  please keep a record of the readings and bring it to your next appointment here.  You can write it on any piece of paper.  please call us sooner if your blood sugar goes below 70, or if you have a lot of readings over 200.   On this type of insulin schedule, you should eat meals on a regular schedule (especially lunch).  If a meal is missed or significantly delayed, your blood sugar could go low.  To help you remember to take the insulin, put it next to something you do every day, such as the breakfast table or toothbrush.   Please come back for a follow-up appointment in 2 months.

## 2015-06-18 NOTE — Patient Instructions (Addendum)
Please increase the insulin to 110 units each morning and 40 units each evening.  blood and urine tests are requested for you today.  We'll let you know about the results. check your blood sugar twice a day.  vary the time of day when you check, between before the 3 meals, and at bedtime.  also check if you have symptoms of your blood sugar being too high or too low.  please keep a record of the readings and bring it to your next appointment here.  You can write it on any piece of paper.  please call us sooner if your blood sugar goes below 70, or if you have a lot of readings over 200.   On this type of insulin schedule, you should eat meals on a regular schedule (especially lunch).  If a meal is missed or significantly delayed, your blood sugar could go low.  To help you remember to take the insulin, put it next to something you do every day, such as the breakfast table or toothbrush.   Please come back for a follow-up appointment in 2 months.

## 2015-06-19 DIAGNOSIS — E1151 Type 2 diabetes mellitus with diabetic peripheral angiopathy without gangrene: Secondary | ICD-10-CM | POA: Insufficient documentation

## 2015-06-19 DIAGNOSIS — E1165 Type 2 diabetes mellitus with hyperglycemia: Secondary | ICD-10-CM

## 2015-06-19 DIAGNOSIS — E119 Type 2 diabetes mellitus without complications: Secondary | ICD-10-CM | POA: Insufficient documentation

## 2015-06-19 LAB — MICROALBUMIN / CREATININE URINE RATIO
Creatinine, Urine: 152 mg/dL (ref 20–370)
Microalb Creat Ratio: 2 mcg/mg creat (ref <30)
Microalb, Ur: 0.3 mg/dL

## 2015-06-19 LAB — TSH: TSH: 2.5 mIU/L (ref 0.40–4.50)

## 2015-07-03 ENCOUNTER — Other Ambulatory Visit: Payer: Self-pay | Admitting: Family Medicine

## 2015-07-05 NOTE — Telephone Encounter (Signed)
Left message for patient to call the office and schedule a follow up

## 2015-07-05 NOTE — Telephone Encounter (Signed)
Please schedule this patient a follow up for his cholesterol/fasting.     KP

## 2015-07-22 ENCOUNTER — Other Ambulatory Visit: Payer: Self-pay | Admitting: Family Medicine

## 2015-08-03 ENCOUNTER — Other Ambulatory Visit: Payer: Self-pay | Admitting: Family Medicine

## 2015-08-03 NOTE — Telephone Encounter (Signed)
30 day supply atorvastatin and fenofibrate sent to pharmacy. Pt is due for fasting office visit with Dr Carollee Herter.  Please call pt to schedule appt within 1 month. Thanks!

## 2015-08-04 NOTE — Telephone Encounter (Signed)
Left message on patients voicemail to call our office and schedule a follow up with Dr. Carollee Herter

## 2015-08-05 ENCOUNTER — Other Ambulatory Visit: Payer: Self-pay | Admitting: Family Medicine

## 2015-08-06 ENCOUNTER — Encounter (INDEPENDENT_AMBULATORY_CARE_PROVIDER_SITE_OTHER): Payer: Self-pay

## 2015-08-06 ENCOUNTER — Other Ambulatory Visit: Payer: Self-pay

## 2015-08-06 ENCOUNTER — Other Ambulatory Visit (INDEPENDENT_AMBULATORY_CARE_PROVIDER_SITE_OTHER): Payer: BC Managed Care – PPO

## 2015-08-06 DIAGNOSIS — E785 Hyperlipidemia, unspecified: Secondary | ICD-10-CM | POA: Diagnosis not present

## 2015-08-06 LAB — COMPREHENSIVE METABOLIC PANEL
ALT: 34 U/L (ref 9–46)
AST: 32 U/L (ref 10–35)
Albumin: 4.2 g/dL (ref 3.6–5.1)
Alkaline Phosphatase: 46 U/L (ref 40–115)
BUN: 17 mg/dL (ref 7–25)
CO2: 24 mmol/L (ref 20–31)
Calcium: 10 mg/dL (ref 8.6–10.3)
Chloride: 108 mmol/L (ref 98–110)
Creat: 1.28 mg/dL (ref 0.70–1.33)
Glucose, Bld: 167 mg/dL — ABNORMAL HIGH (ref 65–99)
Potassium: 4.1 mmol/L (ref 3.5–5.3)
Sodium: 140 mmol/L (ref 135–146)
Total Bilirubin: 0.5 mg/dL (ref 0.2–1.2)
Total Protein: 7.1 g/dL (ref 6.1–8.1)

## 2015-08-06 LAB — LIPID PANEL
Cholesterol: 146 mg/dL (ref 125–200)
HDL: 30 mg/dL — ABNORMAL LOW (ref >=40)
LDL Cholesterol: 78 mg/dL (ref <130)
Total CHOL/HDL Ratio: 4.9 Ratio (ref <=5.0)
Triglycerides: 192 mg/dL — ABNORMAL HIGH (ref <150)
VLDL: 38 mg/dL — ABNORMAL HIGH (ref <30)

## 2015-08-19 ENCOUNTER — Ambulatory Visit: Payer: BC Managed Care – PPO | Admitting: Endocrinology

## 2015-08-20 ENCOUNTER — Ambulatory Visit (INDEPENDENT_AMBULATORY_CARE_PROVIDER_SITE_OTHER): Payer: BC Managed Care – PPO | Admitting: Family Medicine

## 2015-08-20 ENCOUNTER — Encounter: Payer: Self-pay | Admitting: Family Medicine

## 2015-08-20 VITALS — BP 126/78 | HR 75 | Temp 98.6°F | Ht 74.0 in | Wt 295.8 lb

## 2015-08-20 DIAGNOSIS — I10 Essential (primary) hypertension: Secondary | ICD-10-CM

## 2015-08-20 DIAGNOSIS — E785 Hyperlipidemia, unspecified: Secondary | ICD-10-CM

## 2015-08-20 DIAGNOSIS — E039 Hypothyroidism, unspecified: Secondary | ICD-10-CM | POA: Diagnosis not present

## 2015-08-20 DIAGNOSIS — R0683 Snoring: Secondary | ICD-10-CM | POA: Diagnosis not present

## 2015-08-20 MED ORDER — FENOFIBRATE 160 MG PO TABS: ORAL_TABLET | ORAL | Status: DC

## 2015-08-20 MED ORDER — LEVOTHYROXINE SODIUM 100 MCG PO TABS: ORAL_TABLET | ORAL | Status: DC

## 2015-08-20 MED ORDER — LOSARTAN POTASSIUM 100 MG PO TABS: 100.0000 mg | ORAL_TABLET | Freq: Every day | ORAL | Status: DC

## 2015-08-20 NOTE — Assessment & Plan Note (Signed)
Stable Con't lipitor and fenofibrate

## 2015-08-20 NOTE — Progress Notes (Signed)
Patient ID: Alex Rocha, male    DOB: 1956-02-25  Age: 60 y.o. MRN: [id number]    Subjective:  Subjective HPI Alex Rocha presents for f/u bp and c/o inc in snoring.   He had a sleep study in 2011 at [facility] and he said he was told it was not bad enough to do anything about it but he has since gained about 20 lbs.  He is also here f/u bp.  No other complaints.   Review of Systems  Constitutional: Positive for fatigue. Negative for diaphoresis, appetite change and unexpected weight change.  HENT: Negative for congestion, ear pain, hearing loss, nosebleeds, postnasal drip, rhinorrhea, sinus pressure, sneezing and tinnitus.   Eyes: Negative for photophobia, pain, discharge, redness, itching and visual disturbance.  Respiratory: Negative.  Negative for cough, chest tightness, shortness of breath and wheezing.   Cardiovascular: Negative.  Negative for chest pain, palpitations and leg swelling.  Gastrointestinal: Negative for abdominal pain, constipation, blood in stool, abdominal distention and anal bleeding.  Endocrine: Negative.  Negative for cold intolerance, heat intolerance, polydipsia, polyphagia and polyuria.  Genitourinary: Negative.  Negative for dysuria, frequency and difficulty urinating.  Musculoskeletal: Negative.   Skin: Negative.   Allergic/Immunologic: Negative.   Neurological: Negative for dizziness, weakness, light-headedness, numbness and headaches.  Psychiatric/Behavioral: Negative for suicidal ideas, confusion, sleep disturbance, dysphoric mood, decreased concentration and agitation. The patient is not nervous/anxious.     History Past Medical History  Diagnosis Date  . Hyperlipidemia   . Hypertension   . Diabetes mellitus   . Environmental allergies   . Kidney stones   . Colon polyps   . Low testosterone   . Hyperthyroidism     He has no past surgical history on file.   His family history includes Colon cancer in his brother and mother; Diabetes in his  mother; Heart disease in his mother and sister; Hyperlipidemia in his brother; Hypertension in his mother.He reports that he has never smoked. He has never used smokeless tobacco. He reports that he drinks alcohol. He reports that he does not use illicit drugs.  Current Outpatient Prescriptions on File Prior to Visit  Medication Sig Dispense Refill  . acetic acid (VOSOL) 2 % otic solution 5 drops in affected ear  Tid 15 mL 5  . ANDROGEL PUMP 20.25 MG/ACT (1.62%) GEL Reported on 06/18/2015  3  . atorvastatin (LIPITOR) 20 MG tablet TAKE 1 TABLET BY MOUTH EVERY DAY (APPT DUE) 30 tablet 0  . B-D INS SYR ULTRAFINE 1CC/31G 31G X 5/16" 1 ML MISC USE TO INJECT INSULIN 2 TIMES PER DAY. 100 each 1  . insulin NPH Human (NOVOLIN N) 100 UNIT/ML injection 110 units each morning and 40 units each evening, and syringes 2/day 50 mL 11  . omeprazole (PRILOSEC) 40 MG capsule TAKE ONE CAPSULE BY MOUTH EVERY DAY 30 capsule 11   No current facility-administered medications on file prior to visit.     Objective:  Objective Physical Exam  Constitutional: He is oriented to person, place, and time. Vital signs are normal. He appears well-developed and well-nourished. He is sleeping.  HENT:  Head: Normocephalic and atraumatic.  Mouth/Throat: Oropharynx is clear and moist.  Eyes: EOM are normal. Pupils are equal, round, and reactive to light.  Neck: Normal range of motion. Neck supple. No thyromegaly present.  Cardiovascular: Normal rate and regular rhythm.   No murmur heard. Pulmonary/Chest: Effort normal and breath sounds normal. No respiratory distress. He has no wheezes. He has no rales.  He exhibits no tenderness.  Musculoskeletal: He exhibits no edema or tenderness.  Neurological: He is alert and oriented to person, place, and time.  Skin: Skin is warm and dry.  Psychiatric: He has a normal mood and affect. His behavior is normal. Judgment and thought content normal.  Nursing note and vitals reviewed.  BP  126/78 mmHg  Pulse 75  Temp(Src) 98.6 F (37 C) (Oral)  Ht 6\' 2"  (1.88 m)  Wt 295 lb 12.8 oz (134.174 kg)  BMI 37.96 kg/m2  SpO2 97% Wt Readings from Last 3 Encounters:  08/20/15 295 lb 12.8 oz (134.174 kg)  06/18/15 297 lb (134.718 kg)  03/15/15 299 lb (135.626 kg)     Lab Results  Component Value Date   WBC 6.7 12/29/2014   HGB 13.8 12/29/2014   HCT 41.3 12/29/2014   PLT 206 12/29/2014   GLUCOSE 167* 08/06/2015   CHOL 146 08/06/2015   TRIG 192* 08/06/2015   HDL 30* 08/06/2015   LDLDIRECT 75.0 01/11/2015   LDLCALC 78 08/06/2015   ALT 34 08/06/2015   AST 32 08/06/2015   NA 140 08/06/2015   K 4.1 08/06/2015   CL 108 08/06/2015   CREATININE 1.28 08/06/2015   BUN 17 08/06/2015   CO2 24 08/06/2015   TSH 2.50 06/18/2015   HGBA1C 9.2 06/18/2015   MICROALBUR 0.3 06/18/2015    US Soft Tissue Head/neck  03/19/2015  CLINICAL DATA:  60 year old male with a history of hyperthyroidism in the setting of a multinodular goiter now status post I 131 radioactive iodine therapy performed on 02/20/2013 EXAM: THYROID ULTRASOUND TECHNIQUE: Ultrasound examination of the thyroid gland and adjacent soft tissues was performed. COMPARISON:  Prior thyroid ultrasound 01/01/2013 FINDINGS: Right thyroid lobe Measurements: 4.5 x 2.0 x 2.0 cm. Solitary 15 x 17 x 16 mm hypoechoic solid nodule in the lower pole of the right gland. This has decreased in size compared to 23 x 17 x 16 mm previously. The background thyroid parenchyma is mildly heterogeneous. Left thyroid lobe Measurements: 3.8 x 1.3 x 1.5 cm. Small and relatively heterogeneous thyroid gland. No discrete nodule. Isthmus Thickness: 0.5 cm.  No nodules visualized. Lymphadenopathy None visualized. IMPRESSION: 1. Interval involution of the previously noted right lower pole thyroid nodule which presently measures a maximum of 17 mm compared to 23 mm on 01/01/2013. Decrease in size over time is highly consistent with a benign process. 2. No additional  thyroid nodules identified. 3. The background thyroid gland is mildly heterogeneous. Electronically Signed   By: Jacqulynn Cadet M.D.   On: 03/19/2015 12:46     Assessment & Plan:  Plan I have changed Alex Rocha's fenofibrate. I am also having him maintain his acetic acid, omeprazole, ANDROGEL PUMP, B-D INS SYR ULTRAFINE 1CC/31G, insulin NPH Human, atorvastatin, losartan, and levothyroxine.  Meds ordered this encounter  Medications  . losartan (COZAAR) 100 MG tablet    Sig: Take 1 tablet (100 mg total) by mouth daily. Office visit due now    Dispense:  30 tablet    Refill:  0  . levothyroxine (SYNTHROID, LEVOTHROID) 100 MCG tablet    Sig: TAKE 1 TABLET BY MOUTH ONCE DAILY BEFORE BREAKFAST    Dispense:  90 tablet    Refill:  2  . fenofibrate 160 MG tablet    Sig: TAKE 1 TABLET BY MOUTH EVERY DAY    Dispense:  30 tablet    Refill:  0    Problem List Items Addressed This Visit  Unprioritized   HTN (hypertension)    stable      Relevant Medications   losartan (COZAAR) 100 MG tablet   fenofibrate 160 MG tablet   Hyperlipidemia    Stable Con't lipitor and fenofibrate      Relevant Medications   losartan (COZAAR) 100 MG tablet   fenofibrate 160 MG tablet    Other Visit Diagnoses    Snoring    -  Primary    Relevant Orders    Ambulatory referral to Pulmonology    Hypothyroidism, unspecified hypothyroidism type        Relevant Medications    levothyroxine (SYNTHROID, LEVOTHROID) 100 MCG tablet       Follow-up: Return in about 3 months (around 11/20/2015), or if symptoms worsen or fail to improve, for hypertension, hyperlipidemia.  Ann Held, DO

## 2015-08-20 NOTE — Progress Notes (Signed)
Pre visit review using our clinic review tool, if applicable. No additional management support is needed unless otherwise documented below in the visit note. 

## 2015-08-20 NOTE — Patient Instructions (Signed)
Sleep Apnea  Sleep apnea is a sleep disorder characterized by abnormal pauses in breathing while you sleep. When your breathing pauses, the level of oxygen in your blood decreases. This causes you to move out of deep sleep and into light sleep. As a result, your quality of sleep is poor, and the system that carries your blood throughout your body (cardiovascular system) experiences stress. If sleep apnea remains untreated, the following conditions can develop:  High blood pressure (hypertension).  Coronary artery disease.  Inability to achieve or maintain an erection (impotence).  Impairment of your thought process (cognitive dysfunction). There are three types of sleep apnea: 1. Obstructive sleep apnea--Pauses in breathing during sleep because of a blocked airway. 2. Central sleep apnea--Pauses in breathing during sleep because the area of the brain that controls your breathing does not send the correct signals to the muscles that control breathing. 3. Mixed sleep apnea--A combination of both obstructive and central sleep apnea. RISK FACTORS The following risk factors can increase your risk of developing sleep apnea:  Being overweight.  Smoking.  Having narrow passages in your nose and throat.  Being of older age.  Being male.  Alcohol use.  Sedative and tranquilizer use.  Ethnicity. Among individuals younger than 35 years, African Americans are at increased risk of sleep apnea. SYMPTOMS   Difficulty staying asleep.  Daytime sleepiness and fatigue.  Loss of energy.  Irritability.  Loud, heavy snoring.  Morning headaches.  Trouble concentrating.  Forgetfulness.  Decreased interest in sex.  Unexplained sleepiness. DIAGNOSIS  In order to diagnose sleep apnea, your caregiver will perform a physical examination. A sleep study done in the comfort of your own home may be appropriate if you are otherwise healthy. Your caregiver may also recommend that you spend the  night in a sleep lab. In the sleep lab, several monitors record information about your heart, lungs, and brain while you sleep. Your leg and arm movements and blood oxygen level are also recorded. TREATMENT The following actions may help to resolve mild sleep apnea:  Sleeping on your side.   Using a decongestant if you have nasal congestion.   Avoiding the use of depressants, including alcohol, sedatives, and narcotics.   Losing weight and modifying your diet if you are overweight. There also are devices and treatments to help open your airway:  Oral appliances. These are custom-made mouthpieces that shift your lower jaw forward and slightly open your bite. This opens your airway.  Devices that create positive airway pressure. This positive pressure "splints" your airway open to help you breathe better during sleep. The following devices create positive airway pressure:  Continuous positive airway pressure (CPAP) device. The CPAP device creates a continuous level of air pressure with an air pump. The air is delivered to your airway through a mask while you sleep. This continuous pressure keeps your airway open.  Nasal expiratory positive airway pressure (EPAP) device. The EPAP device creates positive air pressure as you exhale. The device consists of single-use valves, which are inserted into each nostril and held in place by adhesive. The valves create very little resistance when you inhale but create much more resistance when you exhale. That increased resistance creates the positive airway pressure. This positive pressure while you exhale keeps your airway open, making it easier to breath when you inhale again.  Bilevel positive airway pressure (BPAP) device. The BPAP device is used mainly in patients with central sleep apnea. This device is similar to the CPAP device because   it also uses an air pump to deliver continuous air pressure through a mask. However, with the BPAP machine, the  pressure is set at two different levels. The pressure when you exhale is lower than the pressure when you inhale.  Surgery. Typically, surgery is only done if you cannot comply with less invasive treatments or if the less invasive treatments do not improve your condition. Surgery involves removing excess tissue in your airway to create a wider passage way.   This information is not intended to replace advice given to you by your health care provider. Make sure you discuss any questions you have with your health care provider.   Document Released: 03/24/2002 Document Revised: 04/24/2014 Document Reviewed: 08/10/2011 Elsevier Interactive Patient Education 2016 [employer]   

## 2015-08-20 NOTE — Assessment & Plan Note (Signed)
stable °

## 2015-08-25 ENCOUNTER — Ambulatory Visit (INDEPENDENT_AMBULATORY_CARE_PROVIDER_SITE_OTHER): Payer: BC Managed Care – PPO | Admitting: Endocrinology

## 2015-08-25 ENCOUNTER — Encounter: Payer: Self-pay | Admitting: Endocrinology

## 2015-08-25 VITALS — BP 130/64 | HR 84 | Temp 98.3°F | Ht 74.0 in | Wt 296.0 lb

## 2015-08-25 DIAGNOSIS — E890 Postprocedural hypothyroidism: Secondary | ICD-10-CM | POA: Diagnosis not present

## 2015-08-25 DIAGNOSIS — Z794 Long term (current) use of insulin: Secondary | ICD-10-CM

## 2015-08-25 DIAGNOSIS — E119 Type 2 diabetes mellitus without complications: Secondary | ICD-10-CM | POA: Diagnosis not present

## 2015-08-25 LAB — POCT GLYCOSYLATED HEMOGLOBIN (HGB A1C): Hemoglobin A1C: 8.9

## 2015-08-25 MED ORDER — INSULIN NPH (HUMAN) (ISOPHANE) 100 UNIT/ML ~~LOC~~ SUSP
SUBCUTANEOUS | Status: DC
Start: 2015-08-25 — End: 2015-12-06

## 2015-08-25 NOTE — Progress Notes (Signed)
Subjective:    Patient ID: Alex Rocha, male    DOB: [birth year], 60 y.o.   MRN: [id number]  HPI Pt returns for f/u of diabetes mellitus: DM type: Insulin-requiring type 2. Dx'ed: 2000.  Complications: none.  Therapy: insulin since 2014.  DKA: never.  Severe hypoglycemia: never.   Pancreatitis: never.   Other: he chose a simple and inexpensive insulin regimen; he says his need to care for his wife, who has cancer, limits his ability to care for himself; he declines weight-loss surgery.  Interval history:  no cbg record, but states cbg's vary from 110-200's.  There is no trend throughout the day.  He says he never misses the insulin. Pt also has multinodular goiter (dx'ed 2014; bx was benign; pt then had i-131 rx; f/u US showed nodules were smaller).  he does not notice the goiter.   Hyperthyroidism: he takes synthroid as rx'ed.   Past Medical History  Diagnosis Date  . Hyperlipidemia   . Hypertension   . Diabetes mellitus   . Environmental allergies   . Kidney stones   . Colon polyps   . Low testosterone   . Hyperthyroidism     No past surgical history on file.  Social History   Social History  . Marital Status: Married    Spouse Name: N/A  . Number of Children: 2  . 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 Smoking status: Never Smoker   . Smokeless tobacco: Never Used  . Alcohol Use: 0.0 oz/week    0 Standard drinks or equivalent per week     Comment: social  . Drug Use: No  . Sexual Activity: Not on file   Other Topics Concern  . Not on file   Social History Narrative    Current Outpatient Prescriptions on File Prior to Visit  Medication Sig Dispense Refill  . acetic acid (VOSOL) 2 % otic solution 5 drops in affected ear  Tid 15 mL 5  . ANDROGEL PUMP 20.25 MG/ACT (1.62%) GEL Reported on 06/18/2015  3  . atorvastatin (LIPITOR) 20 MG tablet TAKE 1 TABLET BY MOUTH EVERY DAY (APPT DUE) 30  tablet 0  . B-D INS SYR ULTRAFINE 1CC/31G 31G X 5/16" 1 ML MISC USE TO INJECT INSULIN 2 TIMES PER DAY. 100 each 1  . fenofibrate 160 MG tablet TAKE 1 TABLET BY MOUTH EVERY DAY 30 tablet 0  . levothyroxine (SYNTHROID, LEVOTHROID) 100 MCG tablet TAKE 1 TABLET BY MOUTH ONCE DAILY BEFORE BREAKFAST 90 tablet 2  . losartan (COZAAR) 100 MG tablet Take 1 tablet (100 mg total) by mouth daily. Office visit due now 30 tablet 0  . omeprazole (PRILOSEC) 40 MG capsule TAKE ONE CAPSULE BY MOUTH EVERY DAY 30 capsule 11   No current facility-administered medications on file prior to visit.    Allergies  Allergen Reactions  . [name] [Yohimbine] Palpitations    Family History  Problem Relation Age of Onset  . Colon cancer Mother   . Colon cancer Brother   . Hyperlipidemia Brother     Mother's side of the family  . Heart disease Sister   . Heart disease Mother   . Hypertension Mother     Mother's side of the family  . Diabetes Mother     Mother's side of the family  . Diabetes      father's family    BP 130/64 mmHg  Pulse 84  Temp(Src) 98.3 F (36.8 C) (Oral)  Ht 6\' 2"  (1.88 m)  Wt 296 lb (134.265 kg)  BMI 37.99 kg/m2  SpO2 95%  Review of Systems He denies hypoglycemia    Objective:   Physical Exam VITAL SIGNS:  See vs page GENERAL: no distress SKIN:  Insulin injection sites at the anterior abdomen are normal  Lab Results  Component Value Date   HGBA1C 8.9 08/25/2015      Assessment & Plan:  DM: he needs increased rx   Patient is advised the following: Patient Instructions  Please increase the insulin to 120 units each morning and 50 units each evening.  check your blood sugar twice a day.  vary the time of day when you check, between before the 3 meals, and at bedtime.  also check if you have symptoms of your blood sugar being too high or too low.  please keep a record of the readings and bring it to your next appointment here.  You can write it on any piece of paper.   please call us sooner if your blood sugar goes below 70, or if you have a lot of readings over 200.   On this type of insulin schedule, you should eat meals on a regular schedule (especially lunch).  If a meal is missed or significantly delayed, your blood sugar could go low.  Please come back for a follow-up appointment in 2 months.

## 2015-08-25 NOTE — Patient Instructions (Addendum)
Please increase the insulin to 120 units each morning and 50 units each evening.  check your blood sugar twice a day.  vary the time of day when you check, between before the 3 meals, and at bedtime.  also check if you have symptoms of your blood sugar being too high or too low.  please keep a record of the readings and bring it to your next appointment here.  You can write it on any piece of paper.  please call us sooner if your blood sugar goes below 70, or if you have a lot of readings over 200.   On this type of insulin schedule, you should eat meals on a regular schedule (especially lunch).  If a meal is missed or significantly delayed, your blood sugar could go low.  Please come back for a follow-up appointment in 2 months.

## 2015-08-31 ENCOUNTER — Other Ambulatory Visit: Payer: Self-pay | Admitting: Family Medicine

## 2015-09-08 ENCOUNTER — Other Ambulatory Visit: Payer: Self-pay | Admitting: Family Medicine

## 2015-09-25 ENCOUNTER — Other Ambulatory Visit: Payer: Self-pay | Admitting: Family Medicine

## 2015-10-25 ENCOUNTER — Ambulatory Visit: Payer: BC Managed Care – PPO | Admitting: Endocrinology

## 2015-11-18 ENCOUNTER — Ambulatory Visit (INDEPENDENT_AMBULATORY_CARE_PROVIDER_SITE_OTHER): Payer: BC Managed Care – PPO | Admitting: Family Medicine

## 2015-11-18 ENCOUNTER — Encounter: Payer: Self-pay | Admitting: Family Medicine

## 2015-11-18 VITALS — BP 138/70 | HR 82 | Temp 99.9°F | Ht 74.0 in | Wt 296.6 lb

## 2015-11-18 DIAGNOSIS — J069 Acute upper respiratory infection, unspecified: Secondary | ICD-10-CM

## 2015-11-18 DIAGNOSIS — I10 Essential (primary) hypertension: Secondary | ICD-10-CM | POA: Diagnosis not present

## 2015-11-18 DIAGNOSIS — E785 Hyperlipidemia, unspecified: Secondary | ICD-10-CM

## 2015-11-18 DIAGNOSIS — E039 Hypothyroidism, unspecified: Secondary | ICD-10-CM | POA: Diagnosis not present

## 2015-11-18 LAB — COMPREHENSIVE METABOLIC PANEL WITH GFR
ALT: 36 U/L (ref 0–53)
AST: 32 U/L (ref 0–37)
Albumin: 4.5 g/dL (ref 3.5–5.2)
Alkaline Phosphatase: 49 U/L (ref 39–117)
BUN: 31 mg/dL — ABNORMAL HIGH (ref 6–23)
CO2: 28 meq/L (ref 19–32)
Calcium: 9.7 mg/dL (ref 8.4–10.5)
Chloride: 104 meq/L (ref 96–112)
Creatinine, Ser: 1.49 mg/dL (ref 0.40–1.50)
GFR: 62.33 mL/min
Glucose, Bld: 263 mg/dL — ABNORMAL HIGH (ref 70–99)
Potassium: 4.6 meq/L (ref 3.5–5.1)
Sodium: 138 meq/L (ref 135–145)
Total Bilirubin: 0.3 mg/dL (ref 0.2–1.2)
Total Protein: 7.6 g/dL (ref 6.0–8.3)

## 2015-11-18 LAB — LIPID PANEL
Cholesterol: 153 mg/dL (ref 0–200)
HDL: 30.7 mg/dL — ABNORMAL LOW
NonHDL: 121.8
Total CHOL/HDL Ratio: 5
Triglycerides: 265 mg/dL — ABNORMAL HIGH (ref 0.0–149.0)
VLDL: 53 mg/dL — ABNORMAL HIGH (ref 0.0–40.0)

## 2015-11-18 LAB — LDL CHOLESTEROL, DIRECT: Direct LDL: 78 mg/dL

## 2015-11-18 MED ORDER — FENOFIBRATE 160 MG PO TABS: ORAL_TABLET | ORAL | 5 refills | Status: DC

## 2015-11-18 MED ORDER — FLUTICASONE PROPIONATE 50 MCG/ACT NA SUSP: 2.0000 | Freq: Every day | NASAL | 6 refills | Status: DC

## 2015-11-18 MED ORDER — LEVOTHYROXINE SODIUM 100 MCG PO TABS: ORAL_TABLET | ORAL | 2 refills | Status: DC

## 2015-11-18 MED ORDER — LEVOCETIRIZINE DIHYDROCHLORIDE 5 MG PO TABS: 5.0000 mg | ORAL_TABLET | Freq: Every evening | ORAL | 5 refills | Status: DC

## 2015-11-18 MED ORDER — LOSARTAN POTASSIUM 100 MG PO TABS: 100.0000 mg | ORAL_TABLET | Freq: Every day | ORAL | 3 refills | Status: DC

## 2015-11-18 MED ORDER — ATORVASTATIN CALCIUM 20 MG PO TABS: ORAL_TABLET | ORAL | 5 refills | Status: DC

## 2015-11-18 NOTE — Patient Instructions (Signed)

## 2015-11-18 NOTE — Progress Notes (Signed)
Patient ID: Alex Rocha, male    DOB: February 08, 1956  Age: 60 y.o. MRN: [id number]    Subjective:  Subjective  HPI Alex Rocha presents for f/u cholesterol and htn.  Pt sees endo for dm and thyroid.  Pt c/o feeling week with chills and nonproductive cough x 3-4 days.  Taking dayquil and nyquil with some relief.  No fevers.  Some congestion in head but not much.    Review of Systems  Constitutional: Negative for appetite change, diaphoresis, fatigue and unexpected weight change.  Eyes: Negative for pain, redness and visual disturbance.  Respiratory: Negative for cough, chest tightness, shortness of breath and wheezing.   Cardiovascular: Negative for chest pain, palpitations and leg swelling.  Endocrine: Negative for cold intolerance, heat intolerance, polydipsia, polyphagia and polyuria.  Genitourinary: Negative for difficulty urinating, dysuria and frequency.  Neurological: Negative for dizziness, light-headedness, numbness and headaches.    History Past Medical History:  Diagnosis Date  . Colon polyps   . Diabetes mellitus   . Environmental allergies   . Hyperlipidemia   . Hypertension   . Hyperthyroidism   . Kidney stones   . Low testosterone     He has no past surgical history on file.   His family history includes Colon cancer in his brother and mother; Diabetes in his mother; Heart disease in his mother and sister; Hyperlipidemia in his brother; Hypertension in his mother.He reports that he has never smoked. He has never used smokeless tobacco. He reports that he drinks alcohol. He reports that he does not use drugs.  Current Outpatient Prescriptions on File Prior to Visit  Medication Sig Dispense Refill  . acetic acid (VOSOL) 2 % otic solution 5 drops in affected ear  Tid 15 mL 5  . ANDROGEL PUMP 20.25 MG/ACT (1.62%) GEL Reported on 06/18/2015  3  . B-D INS SYR ULTRAFINE 1CC/31G 31G X 5/16" 1 ML MISC USE TO INJECT INSULIN 2 TIMES PER DAY. 100 each 1  . insulin NPH Human  (NOVOLIN N) 100 UNIT/ML injection 120 units each morning and 50 units each evening, and syringes 2/day 50 mL 11  . omeprazole (PRILOSEC) 40 MG capsule TAKE ONE CAPSULE BY MOUTH EVERY DAY 30 capsule 5   No current facility-administered medications on file prior to visit.      Objective:  Objective  Physical Exam  Constitutional: He is oriented to person, place, and time. Vital signs are normal. He appears well-developed and well-nourished. He is sleeping.  HENT:  Head: Normocephalic and atraumatic.  Mouth/Throat: Oropharynx is clear and moist.  Eyes: EOM are normal. Pupils are equal, round, and reactive to light.  Neck: Normal range of motion. Neck supple. No thyromegaly present.  Cardiovascular: Normal rate and regular rhythm.   No murmur heard. Pulmonary/Chest: Effort normal and breath sounds normal. No respiratory distress. He has no wheezes. He has no rales. He exhibits no tenderness.  Musculoskeletal: He exhibits no edema or tenderness.  Neurological: He is alert and oriented to person, place, and time.  Skin: Skin is warm and dry.  Psychiatric: He has a normal mood and affect. His behavior is normal. Judgment and thought content normal.  Nursing note and vitals reviewed.  BP 138/70 (BP Location: Left Arm, Patient Position: Sitting, Cuff Size: Large)   Pulse 82   Temp 99.9 F (37.7 C) (Oral)   Ht 6\' 2"  (1.88 m)   Wt 296 lb 9.6 oz (134.5 kg)   SpO2 96%   BMI 38.08 kg/m  Wt  Readings from Last 3 Encounters:  11/18/15 296 lb 9.6 oz (134.5 kg)  08/25/15 296 lb (134.3 kg)  08/20/15 295 lb 12.8 oz (134.2 kg)     Lab Results  Component Value Date   WBC 6.7 12/29/2014   HGB 13.8 12/29/2014   HCT 41.3 12/29/2014   PLT 206 12/29/2014   GLUCOSE 263 (H) 11/18/2015   CHOL 153 11/18/2015   TRIG 265.0 (H) 11/18/2015   HDL 30.70 (L) 11/18/2015   LDLDIRECT 78.0 11/18/2015   LDLCALC 78 08/06/2015   ALT 36 11/18/2015   AST 32 11/18/2015   NA 138 11/18/2015   K 4.6 11/18/2015    CL 104 11/18/2015   CREATININE 1.49 11/18/2015   BUN 31 (H) 11/18/2015   CO2 28 11/18/2015   TSH 2.50 06/18/2015   HGBA1C 8.9 08/25/2015   MICROALBUR 0.3 06/18/2015    US Soft Tissue Head/neck  Result Date: 03/19/2015 CLINICAL DATA:  60 year old male with a history of hyperthyroidism in the setting of a multinodular goiter now status post I 131 radioactive iodine therapy performed on 02/20/2013 EXAM: THYROID ULTRASOUND TECHNIQUE: Ultrasound examination of the thyroid gland and adjacent soft tissues was performed. COMPARISON:  Prior thyroid ultrasound 01/01/2013 FINDINGS: Right thyroid lobe Measurements: 4.5 x 2.0 x 2.0 cm. Solitary 15 x 17 x 16 mm hypoechoic solid nodule in the lower pole of the right gland. This has decreased in size compared to 23 x 17 x 16 mm previously. The background thyroid parenchyma is mildly heterogeneous. Left thyroid lobe Measurements: 3.8 x 1.3 x 1.5 cm. Small and relatively heterogeneous thyroid gland. No discrete nodule. Isthmus Thickness: 0.5 cm.  No nodules visualized. Lymphadenopathy None visualized. IMPRESSION: 1. Interval involution of the previously noted right lower pole thyroid nodule which presently measures a maximum of 17 mm compared to 23 mm on 01/01/2013. Decrease in size over time is highly consistent with a benign process. 2. No additional thyroid nodules identified. 3. The background thyroid gland is mildly heterogeneous. Electronically Signed   By: Jacqulynn Cadet M.D.   On: 03/19/2015 12:46     Assessment & Plan:  Plan  I am having Mr. Wenhold start on fluticasone. I am also having him maintain his acetic acid, ANDROGEL PUMP, B-D INS SYR ULTRAFINE 1CC/31G, insulin NPH Human, omeprazole, losartan, levothyroxine, levocetirizine, fenofibrate, and atorvastatin.  Meds ordered this encounter  Medications  . DISCONTD: levocetirizine (XYZAL) 5 MG tablet    Sig: Take 1 tablet (5 mg total) by mouth every evening.    Dispense:  30 tablet    Refill:  5  .  fluticasone (FLONASE) 50 MCG/ACT nasal spray    Sig: Place 2 sprays into both nostrils daily.    Dispense:  16 g    Refill:  6  . losartan (COZAAR) 100 MG tablet    Sig: Take 1 tablet (100 mg total) by mouth daily.    Dispense:  30 tablet    Refill:  3  . levothyroxine (SYNTHROID, LEVOTHROID) 100 MCG tablet    Sig: TAKE 1 TABLET BY MOUTH ONCE DAILY BEFORE BREAKFAST    Dispense:  90 tablet    Refill:  2  . levocetirizine (XYZAL) 5 MG tablet    Sig: Take 1 tablet (5 mg total) by mouth every evening.    Dispense:  30 tablet    Refill:  5  . fenofibrate 160 MG tablet    Sig: TAKE 1 TABLET BY MOUTH EVERY DAY (LABS DUE)    Dispense:  30  tablet    Refill:  5  . atorvastatin (LIPITOR) 20 MG tablet    Sig: TAKE 1 TABLET BY MOUTH EVERY DAY (APPT DUE)    Dispense:  30 tablet    Refill:  5    Problem List Items Addressed This Visit      Unprioritized   HTN (hypertension)   Relevant Medications   losartan (COZAAR) 100 MG tablet   fenofibrate 160 MG tablet   atorvastatin (LIPITOR) 20 MG tablet   Other Relevant Orders   Comprehensive metabolic panel (Completed)   POCT urinalysis dipstick    Other Visit Diagnoses    Acute upper respiratory infection    -  Primary   Relevant Medications   fluticasone (FLONASE) 50 MCG/ACT nasal spray   levocetirizine (XYZAL) 5 MG tablet   Hyperlipidemia LDL goal <70       Relevant Medications   losartan (COZAAR) 100 MG tablet   fenofibrate 160 MG tablet   atorvastatin (LIPITOR) 20 MG tablet   Other Relevant Orders   Comprehensive metabolic panel (Completed)   POCT urinalysis dipstick   Lipid panel (Completed)   Hypothyroidism, unspecified hypothyroidism type       Relevant Medications   levothyroxine (SYNTHROID, LEVOTHROID) 100 MCG tablet      Follow-up: Return in about 6 months (around 05/20/2016) for hyperlipidemia, hypertension.  Ann Held, DO

## 2015-11-18 NOTE — Progress Notes (Signed)
Pre visit review using our clinic review tool, if applicable. No additional management support is needed unless otherwise documented below in the visit note. 

## 2015-11-26 ENCOUNTER — Other Ambulatory Visit: Payer: Self-pay

## 2015-12-06 ENCOUNTER — Ambulatory Visit (INDEPENDENT_AMBULATORY_CARE_PROVIDER_SITE_OTHER): Payer: BC Managed Care – PPO | Admitting: Endocrinology

## 2015-12-06 ENCOUNTER — Encounter: Payer: Self-pay | Admitting: Endocrinology

## 2015-12-06 DIAGNOSIS — G479 Sleep disorder, unspecified: Secondary | ICD-10-CM | POA: Insufficient documentation

## 2015-12-06 DIAGNOSIS — E890 Postprocedural hypothyroidism: Secondary | ICD-10-CM

## 2015-12-06 DIAGNOSIS — G4730 Sleep apnea, unspecified: Secondary | ICD-10-CM | POA: Insufficient documentation

## 2015-12-06 DIAGNOSIS — E119 Type 2 diabetes mellitus without complications: Secondary | ICD-10-CM | POA: Diagnosis not present

## 2015-12-06 LAB — POCT GLYCOSYLATED HEMOGLOBIN (HGB A1C): Hemoglobin A1C: 9.4

## 2015-12-06 MED ORDER — INSULIN NPH (HUMAN) (ISOPHANE) 100 UNIT/ML ~~LOC~~ SUSP: SUBCUTANEOUS | 11 refills | Status: DC

## 2015-12-06 NOTE — Progress Notes (Signed)
Subjective:    Patient ID: Alex Rocha, male    DOB: Sep 06, 1955, 60 y.o.   MRN: [id number]  HPI Pt returns for f/u of diabetes mellitus: DM type: Insulin-requiring type 2. Dx'ed: 2000.  Complications: none.  Therapy: insulin since 2014.  DKA: never.  Severe hypoglycemia: never.   Pancreatitis: never.   Other: he chose a simple and inexpensive insulin regimen; he says his need to care for his wife, who has cancer, limits his ability to care for himself; he declines weight-loss surgery.  Interval history:  Pt says he misses the insulin less than once per month.  no cbg record, but states cbg's vary from 80-300.  He says it is highest in am.  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  he does not notice the goiter.   Hyperthyroidism: he takes synthroid as rx'ed.  Past Medical History:  Diagnosis Date  . Colon polyps   . Diabetes mellitus   . Environmental allergies   . Hyperlipidemia   . Hypertension   . Hyperthyroidism   . Kidney stones   . Low testosterone     No past surgical history on file.  Social History   Social History  . Marital status: Married    Spouse name: N/A  . Number of children: 2  . Years of education: N/A   Occupational History  . probation/parole officer    Social History Main Topics  . Smoking status: Never Smoker  . Smokeless tobacco: Never Used  . Alcohol use 0.0 oz/week     Comment: social  . Drug use: No  . Sexual activity: Not on file   Other Topics Concern  . Not on file   Social History Narrative  . No narrative on file    Current Outpatient Prescriptions on File Prior to Visit  Medication Sig Dispense Refill  . acetic acid (VOSOL) 2 % otic solution 5 drops in affected ear  Tid 15 mL 5  . ANDROGEL PUMP 20.25 MG/ACT (1.62%) GEL Reported on 06/18/2015  3  . atorvastatin (LIPITOR) 20 MG tablet TAKE 1 TABLET BY MOUTH EVERY DAY (APPT DUE) 30 tablet 5  . B-D INS  SYR ULTRAFINE 1CC/31G 31G X 5/16" 1 ML MISC USE TO INJECT INSULIN 2 TIMES PER DAY. 100 each 1  . fenofibrate 160 MG tablet TAKE 1 TABLET BY MOUTH EVERY DAY (LABS DUE) 30 tablet 5  . fluticasone (FLONASE) 50 MCG/ACT nasal spray Place 2 sprays into both nostrils daily. 16 g 6  . levocetirizine (XYZAL) 5 MG tablet Take 1 tablet (5 mg total) by mouth every evening. 30 tablet 5  . levothyroxine (SYNTHROID, LEVOTHROID) 100 MCG tablet TAKE 1 TABLET BY MOUTH ONCE DAILY BEFORE BREAKFAST 90 tablet 2  . omeprazole (PRILOSEC) 40 MG capsule TAKE ONE CAPSULE BY MOUTH EVERY DAY 30 capsule 5   No current facility-administered medications on file prior to visit.     Allergies  Allergen Reactions  . [name] [Yohimbine] Palpitations    Family History  Problem Relation Age of Onset  . Colon cancer Mother   . Colon cancer Brother   . Hyperlipidemia Brother     Mother's side of the family  . Heart disease Sister   . Heart disease Mother   . Hypertension Mother     Mother's side of the family  . Diabetes Mother     Mother's side of the family  . Diabetes  father's family    BP 96/64   Pulse 88   Ht 6\' 2"  (1.88 m)   Wt 297 lb (134.7 kg)   BMI 38.13 kg/m    Review of Systems He denies hypoglycemia and LOC.     Objective:   Physical Exam VITAL SIGNS:  See vs page GENERAL: no distress Pulses: dorsalis pedis intact bilat.   MSK: no deformity of the feet CV: trace bilat leg edema Skin:  no ulcer on the feet.  normal color and temp on the feet. Neuro: sensation is intact to touch on the feet.  Ext: There is bilateral onychomycosis of the toenails.     A1c=9.4%    Assessment & Plan:  Insulin-requiring type 2 DM: worse.   Hypotension, new, mild

## 2015-12-06 NOTE — Patient Instructions (Addendum)
Please increase the insulin to 130 units each morning and 70 units each evening.   check your blood sugar twice a day.  vary the time of day when you check, between before the 3 meals, and at bedtime.  also check if you have symptoms of your blood sugar being too high or too low.  please keep a record of the readings and bring it to your next appointment here.  You can write it on any piece of paper.  please call us sooner if your blood sugar goes below 70, or if you have a lot of readings over 200.   On this type of insulin schedule, you should eat meals on a regular schedule (especially lunch).  If a meal is missed or significantly delayed, your blood sugar could go low.   Please stop taking the losartan.  Please have the blood pressure rechecked at Dr Nonda Lou office in a few days.   Please come back for a follow-up appointment in 2 months.

## 2015-12-08 ENCOUNTER — Telehealth: Payer: Self-pay | Admitting: Family Medicine

## 2015-12-08 NOTE — Telephone Encounter (Signed)
Caller name: Deloris Relation to pt: self Call back number: [phone] Pharmacy:  Reason for call: Pt came in office stating saw his Endocrinologist on Monday 12-06-15 and stated that his BP was 96/63 and was recommend by his Endocrinologist not to take his BP meds and also recommended to have his BP checked again by nurse at his PCP doctors office. Pt would like to know if BP can be checked. Please advise.

## 2015-12-09 ENCOUNTER — Encounter: Payer: Self-pay | Admitting: Medical

## 2015-12-09 ENCOUNTER — Ambulatory Visit (INDEPENDENT_AMBULATORY_CARE_PROVIDER_SITE_OTHER): Payer: BC Managed Care – PPO | Admitting: Medical

## 2015-12-09 VITALS — BP 140/71 | HR 83 | Temp 98.7°F | Ht 74.0 in | Wt 299.0 lb

## 2015-12-09 DIAGNOSIS — R5383 Other fatigue: Secondary | ICD-10-CM | POA: Diagnosis not present

## 2015-12-09 DIAGNOSIS — R0683 Snoring: Secondary | ICD-10-CM | POA: Diagnosis not present

## 2015-12-09 NOTE — Patient Instructions (Addendum)
For your severe fatigue and snoring I sent referral staff message to try to get you quicker appointment.   During interim continue current med regimen.   I am giving you days off of work until Tuesday. If you would investigate light duty or bring me a sheet I can specify office duty/no driving on return to work.(I did state on return to work note no climbing ladder and no driving)  Follow up 2-3 weeks or as needed

## 2015-12-09 NOTE — Progress Notes (Signed)
Subjective:    Patient ID: Alex Rocha, male    DOB: 01-Feb-1956, 60 y.o.   MRN: [id number]  HPI   Pt in states he has been feeling fatigued. This has been going on for a couple of months. Pt states sometimes he almost falls asleep quickly. Pt states he saw Dr. Sherrine Maples last time and she scheduled him with pulmonologist for likely sleep study. He has appointment on Sept 28, 2017. But he expresses need to be seen sooner. He states they are reporting his extreme snoring now. Pt states snoring so bad his wife asked him to sleep in another room. Also friend of his who he visited stated he could here him on second floor as he slept in basement. Recently fell asleep typing and intermitenlty talking to client in the past. Has concern for falling asleep driving.  Pt last a1-c was 8.9 and his thyroid was low in past.(pt is on thyroid medicine)  Review of Systems  Constitutional: Positive for fatigue. Negative for chills.  Respiratory: Negative for cough, chest tightness, shortness of breath and wheezing.   Cardiovascular: Negative for chest pain and palpitations.  Gastrointestinal: Negative for abdominal pain.  Musculoskeletal: Negative for back pain.  Skin: Negative for rash.  Neurological: Negative for dizziness, seizures, facial asymmetry and numbness.  Hematological: Negative for adenopathy. Does not bruise/bleed easily.  Psychiatric/Behavioral: Positive for sleep disturbance. Negative for behavioral problems, confusion, dysphoric mood and suicidal ideas. The patient is not nervous/anxious.        Snoring disturbs sleep.    Past Medical History:  Diagnosis Date  . Colon polyps   . Diabetes mellitus   . Environmental allergies   . Hyperlipidemia   . Hypertension   . Hyperthyroidism   . Kidney stones   . Low testosterone      Social History   Social History  . Marital status: Married    Spouse name: N/A  . Number of children: 2  . Years of education: N/A   Occupational History    . probation/parole officer    Social History Main Topics  . Smoking status: Never Smoker  . Smokeless tobacco: Never Used  . Alcohol use 0.0 oz/week     Comment: social  . Drug use: No  . Sexual activity: Not on file   Other Topics Concern  . Not on file   Social History Narrative  . No narrative on file    No past surgical history on file.  Family History  Problem Relation Age of Onset  . Colon cancer Mother   . Colon cancer Brother   . Hyperlipidemia Brother     Mother's side of the family  . Heart disease Sister   . Heart disease Mother   . Hypertension Mother     Mother's side of the family  . Diabetes Mother     Mother's side of the family  . Diabetes      father's family    Allergies  Allergen Reactions  . [name] [Yohimbine] Palpitations    Current Outpatient Prescriptions on File Prior to Visit  Medication Sig Dispense Refill  . atorvastatin (LIPITOR) 20 MG tablet TAKE 1 TABLET BY MOUTH EVERY DAY (APPT DUE) 30 tablet 5  . B-D INS SYR ULTRAFINE 1CC/31G 31G X 5/16" 1 ML MISC USE TO INJECT INSULIN 2 TIMES PER DAY. 100 each 1  . fenofibrate 160 MG tablet TAKE 1 TABLET BY MOUTH EVERY DAY (LABS DUE) 30 tablet 5  . insulin NPH Human (  NOVOLIN N) 100 UNIT/ML injection 130 units each morning and 70 units each evening, and syringes 2/day 50 mL 11  . levocetirizine (XYZAL) 5 MG tablet Take 1 tablet (5 mg total) by mouth every evening. 30 tablet 5  . levothyroxine (SYNTHROID, LEVOTHROID) 100 MCG tablet TAKE 1 TABLET BY MOUTH ONCE DAILY BEFORE BREAKFAST 90 tablet 2  . omeprazole (PRILOSEC) 40 MG capsule TAKE ONE CAPSULE BY MOUTH EVERY DAY 30 capsule 5  . acetic acid (VOSOL) 2 % otic solution 5 drops in affected ear  Tid (Patient not taking: Reported on 12/09/2015) 15 mL 5  . ANDROGEL PUMP 20.25 MG/ACT (1.62%) GEL Reported on 06/18/2015  3  . fluticasone (FLONASE) 50 MCG/ACT nasal spray Place 2 sprays into both nostrils daily. (Patient not taking: Reported on 12/09/2015) 16  g 6   No current facility-administered medications on file prior to visit.     BP 140/71   Pulse 83   Temp 98.7 F (37.1 C) (Oral)   Ht 6\' 2"  (1.88 m)   Wt 299 lb (135.6 kg)   SpO2 97%   BMI 38.39 kg/m       Objective:   Physical Exam  General Mental Status- Alert. General Appearance- Not in acute distress.   Skin General: Color- Normal Color. Moisture- Normal Moisture.  Neck Carotid Arteries- Normal color. Moisture- Normal Moisture. No carotid bruits. No JVD.  Chest and Lung Exam Auscultation: Breath Sounds:-Normal.  Cardiovascular Auscultation:Rythm- Regular. Murmurs & Other Heart Sounds:Auscultation of the heart reveals- No Murmurs.  Abdomen Inspection:-Inspeection Normal. Palpation/Percussion:Note:No mass. Palpation and Percussion of the abdomen reveal- Non Tender, Non Distended + BS, no rebound or guarding.  Neurologic Cranial Nerve exam:- CN III-XII intact(No nystagmus), symmetric smile. Strength:- 5/5 equal and symmetric strength both upper and lower extremities.      Assessment & Plan:  For your severe fatigue and snoring I sent referral staff message to try to get you quicker appointment.   During interim continue current med regimen.   I am giving you days off of work until Tuesday. If you would investigate light duty or bring me a sheet I can specify office duty/no driving on return to work.  Pt declined doing labs today for fatigue since some labs done already this month.  Follow up 2-3 weeks or as needed

## 2015-12-09 NOTE — Telephone Encounter (Signed)
Coming in this am

## 2015-12-09 NOTE — Progress Notes (Signed)
Pre visit review using our clinic tool,if applicable. No additional management support is needed unless otherwise documented below in the visit note.  

## 2015-12-10 ENCOUNTER — Telehealth: Payer: Self-pay

## 2015-12-10 NOTE — Telephone Encounter (Signed)
Paperwork received and forwarded to [employer] for completion.

## 2015-12-14 ENCOUNTER — Telehealth: Payer: Self-pay | Admitting: Medical

## 2015-12-14 NOTE — Telephone Encounter (Signed)
Pt's HR department called in to follow up on form. She says that it's needed in order for pt to return to work. Once form is completed if it could be faxed to Fax [phone]    [facility]  - [phone] - phone if needed.

## 2015-12-14 NOTE — Telephone Encounter (Signed)
Pt dropped off document to be filled out -pt states already brought in documents before and he had picked up those but at his job they are requiring to have PCP signed the documents that we brought in today 12-14-15 ([employer]) he will pick up to take to work.

## 2015-12-16 ENCOUNTER — Telehealth: Payer: Self-pay

## 2015-12-16 NOTE — Progress Notes (Signed)
[facility] Consultation      Assessment and Plan:  Excessive daytime sleepiness.  -Patient is extremely sleepy during the day, with cognitive dysfunction, memory impairment. -Set the patient up for a sleep study. -Given his severe sleepiness, we will try to set him up with a loaner device to be started as soon as possible. -He is instructed not to use loaner device the night of the sleep study.  Diabetes mellitus.  -Sleep apnea can worsen blood sugar control.  Hypothyroidism.  -This can contribute to excessive daytime sleepiness, the patient's sleepiness continues after starting CPAP, this can be further investigated. In the meantime to continue thyroid hormone replacement.  Essential hypertension. -Sleep apnea can contribute to elevated blood pressure, if present. We'll need to treat sleep apnea with CPAP.  Obesity. -This can contribute to sleep apnea, recommended weight loss.  Date: 12/16/2015  MRN# [id number] Alex Rocha January 03, 1956  Referring Physician:   Ascher Rocha is a 60 y.o. old male seen in consultation for chief complaint of:    Chief Complaint  Patient presents with  . sleep consult    Per Alex Rocha. prior sleep study around 2007 that showed mild OSA, pt never setup on cpap.  pt c/o daytime sleepiness, restless sleep, loud snoring & wakes gasping for air EPWORTH:22    HPI:   Patient is a 60 yo male who presents with daytime sleepiness.   Pt states snoring so bad his wife asked him to sleep in another room. Also friend of his who he visited stated he could here him on second floor as he slept in basement. Recently fell asleep typing and intermitenlty talking to client in the past. Has concern for falling asleep driving. He has been diagnosed with OSA in past, he had a sleep study about 10 yrs ago. He was told it he did not require PAP. We have obtained his old study which showed that his AHI was 4.  ESS score is 22 today.  He  typically goes to bed between 9-10 pm. Wakes at 630 am. He has gained about 25 lbs in past 2 yrs.  The patient also notes that he has been increasingly forgetful over the last several months, and has difficulty concentrating on tasks.  PMHX:   Past Medical History:  Diagnosis Date  . Colon polyps   . Diabetes mellitus   . Environmental allergies   . Hyperlipidemia   . Hypertension   . Hyperthyroidism   . Kidney stones   . Low testosterone    Surgical Hx:  No past surgical history on file. Family Hx:  Family History  Problem Relation Age of Onset  . Colon cancer Mother   . Colon cancer Brother   . Hyperlipidemia Brother     Mother's side of the family  . Heart disease Sister   . Heart disease Mother   . Hypertension Mother     Mother's side of the family  . Diabetes Mother     Mother's side of the family  . Diabetes      father's family   Social Hx:   Social History  Substance Use Topics  . Smoking status: Never Smoker  . Smokeless tobacco: Never Used  . Alcohol use 0.0 oz/week     Comment: social   Medication:   Reviewed    Allergies:  [name] [yohimbine]  Review of Systems: Gen:  Denies  fever, sweats, chills HEENT: Denies blurred vision, double vision. Bleeds. Cvc:  No dizziness,  chest pain. Resp:   Denies cough or sputum production. Gi: Denies swallowing difficulty, stomach pain. Gu:  Denies bladder incontinence, burning urine Ext:   No Joint pain, stiffness. Skin: No skin rash,  hives  Endoc:  No polyuria, polydipsia. Psych: No depression, insomnia. Other:  All other systems were reviewed with the patient and were negative other that what is mentioned in the HPI.   Physical Examination:   VS: BP 124/62 (BP Location: Left Wrist, Cuff Size: Normal)   Pulse 82   Ht 6' 1.5" (1.867 m)   Wt 299 lb (135.6 kg)   SpO2 97%   BMI 38.91 kg/m   General Appearance: No distress  Neuro:without focal findings,  speech normal,  HEENT: PERRLA, EOM intact.     Pulmonary: normal breath sounds, No wheezing.  CardiovascularNormal S1,S2.  No m/r/g.   Abdomen: Benign, Soft, non-tender. Renal:  No costovertebral tenderness  GU:  No performed at this time. Endoc: No evident thyromegaly, no signs of acromegaly. Skin:   warm, no rashes, no ecchymosis  Extremities: normal, no cyanosis, clubbing.  Other findings:    LABORATORY PANEL:   CBC No results for input(s): WBC, HGB, HCT, PLT in the last 168 hours. ------------------------------------------------------------------------------------------------------------------  Chemistries  No results for input(s): NA, K, CL, CO2, GLUCOSE, BUN, CREATININE, CALCIUM, MG, AST, ALT, ALKPHOS, BILITOT in the last 168 hours.  Invalid input(s): GFRCGP ------------------------------------------------------------------------------------------------------------------  Cardiac Enzymes No results for input(s): TROPONINI in the last 168 hours. ------------------------------------------------------------  RADIOLOGY:  No results found.     Thank  you for the consultation and for allowing [facility] to assist in the care of your patient. Our recommendations are noted above.  Please contact us if we can be of further service.   Marda Stalker, MD.  Board Certified in Internal Medicine, Pulmonary Medicine, [place], and Sleep Medicine.  [facility] Office Number: [phone]  Patricia Pesa, M.D.  Vilinda Boehringer, M.D.  Merton Border, M.D  12/16/2015

## 2015-12-16 NOTE — Telephone Encounter (Signed)
I reviewed pt ncdps sheet. Very extensive. Will check no driving. But also want to ask him if he feels he can do everything else on sheets. With likely sleep apnea and his fatigue he describes just wanted to make sure before checked yes and then this obligates him to perform. I called and left message. If I slow down today or have gap please try to get him on phone so can review these questions with him.

## 2015-12-16 NOTE — Telephone Encounter (Signed)
Pt called back In to follow up on paperwork. Pt says that it is urgently needed so that he can return back to work.    If someone could assist pt further.    Thanks.    CB: [phone]

## 2015-12-16 NOTE — Telephone Encounter (Signed)
Paperwork received.  Called patient and left a message to make him aware that his paperwork is currently in process.

## 2015-12-16 NOTE — Telephone Encounter (Signed)
Called patient. Left mass paperwork age for patient to pick up completed [employer] paperwork at front desk.

## 2015-12-17 ENCOUNTER — Encounter: Payer: Self-pay | Admitting: Internal Medicine

## 2015-12-17 ENCOUNTER — Ambulatory Visit (INDEPENDENT_AMBULATORY_CARE_PROVIDER_SITE_OTHER): Payer: BC Managed Care – PPO | Admitting: Internal Medicine

## 2015-12-17 VITALS — BP 124/62 | HR 82 | Ht 73.5 in | Wt 299.0 lb

## 2015-12-17 DIAGNOSIS — G4719 Other hypersomnia: Secondary | ICD-10-CM | POA: Diagnosis not present

## 2015-12-17 DIAGNOSIS — G4733 Obstructive sleep apnea (adult) (pediatric): Secondary | ICD-10-CM

## 2015-12-17 NOTE — Patient Instructions (Addendum)
--  Will send for sleep study.   --Weight loss may be beneficial.   --We will see if we can get you on auto-CPAP as soon as possible. Do not wear your cpap the night you have your sleep study.   --You should not be driving when you are sleepy. If you feel sleepy while you are driving, pull over and take a nap.

## 2015-12-17 NOTE — Addendum Note (Signed)
Addended by: Maryanna Shape A on: 12/17/2015 09:13 AM   Modules accepted: Orders

## 2015-12-23 ENCOUNTER — Telehealth: Payer: Self-pay | Admitting: Internal Medicine

## 2015-12-23 NOTE — Telephone Encounter (Signed)
HST was ordered 12/17/15. Please advise PCC's thanks

## 2015-12-24 NOTE — Telephone Encounter (Signed)
LMOAM for pt again at 4:20 pm that if he had any questions regarding the HST process to please contact me at [phone]. That he should hear from the [place] office within 2 wks to arrange. Nothing else needed at this time. Rhonda J Cobb

## 2015-12-24 NOTE — Telephone Encounter (Signed)
[employer] Authorization was just obtained and order was sent to [facility] on 12/22/15.   ATC patient to advise that HST orders are processed in the order they are received. Typically we allow 2 wks once a HST has been approved.  Advised patient of this on the VM and that order has been sent to [facility] for processing.  Advised patient that if he had any additional questions for me, to return my call [phone]. Rhonda J Cobb

## 2015-12-27 ENCOUNTER — Telehealth: Payer: Self-pay | Admitting: Internal Medicine

## 2015-12-27 NOTE — Telephone Encounter (Signed)
LMOAM for pt to return my call.  We asked [facility] to provide a loaner for patient until he completes his test. [facility] is calling to provide that loaner. Rhonda J Cobb

## 2015-12-27 NOTE — Telephone Encounter (Signed)
Pt states [facility] is telling him he has an appt on 9/13,and would like to know who this is with. Please call. They states he needs to complete a 2nd part of a sleep study.

## 2015-12-28 DIAGNOSIS — G4733 Obstructive sleep apnea (adult) (pediatric): Secondary | ICD-10-CM | POA: Diagnosis not present

## 2015-12-28 NOTE — Telephone Encounter (Signed)
Pt was set up for HST today.  Patient p/u HST device from [facility] today.  Called and spoke with Jeani Hawking at [place] and pt has made her aware that he will do HST tonight. Once Sleep Study has been read, we will be able to move forward with loaner CPAP. Contacted patient and he is aware of the above. Advised patient that if he had any questions, to please contact me on [phone]. Nothing else needed at this time. Rhonda J Cobb

## 2015-12-29 ENCOUNTER — Other Ambulatory Visit: Payer: Self-pay | Admitting: Family Medicine

## 2015-12-30 DIAGNOSIS — G4733 Obstructive sleep apnea (adult) (pediatric): Secondary | ICD-10-CM | POA: Diagnosis not present

## 2015-12-31 ENCOUNTER — Other Ambulatory Visit: Payer: Self-pay | Admitting: *Deleted

## 2015-12-31 ENCOUNTER — Telehealth: Payer: Self-pay | Admitting: *Deleted

## 2015-12-31 DIAGNOSIS — G4719 Other hypersomnia: Secondary | ICD-10-CM

## 2015-12-31 NOTE — Telephone Encounter (Signed)
Per insurance pt can't have in lab study. Pt is scheduled to be setup on machine on 01/04/16 with auto setting of 5-20 as per referral for loaner. Nothing further needed.

## 2015-12-31 NOTE — Telephone Encounter (Signed)
-----   Message from Laverle Hobby, MD sent at 12/31/2015  2:24 PM EDT ----- Sleep study positive for OSA, please send for titration. In-lab if covered, otherwise auto-PAP.   ----- Message ----- From: Chesley Mires, MD Sent: 12/30/2015   9:01 AM To: Laverle Hobby, MD  Deep,  Home sleep study 12/28/15 >> AHI 86.5, SaO2 low 66%.  Probably needs in lab titration study.  Thanks.  [place]

## 2016-01-13 ENCOUNTER — Institutional Professional Consult (permissible substitution): Payer: BC Managed Care – PPO | Admitting: Pulmonary Disease

## 2016-01-26 ENCOUNTER — Other Ambulatory Visit: Payer: Self-pay | Admitting: Family Medicine

## 2016-02-04 ENCOUNTER — Encounter: Payer: Self-pay | Admitting: Endocrinology

## 2016-02-04 ENCOUNTER — Ambulatory Visit (INDEPENDENT_AMBULATORY_CARE_PROVIDER_SITE_OTHER): Payer: BC Managed Care – PPO | Admitting: Endocrinology

## 2016-02-04 VITALS — BP 132/80 | HR 78 | Ht 74.0 in | Wt 302.0 lb

## 2016-02-04 DIAGNOSIS — E119 Type 2 diabetes mellitus without complications: Secondary | ICD-10-CM | POA: Diagnosis not present

## 2016-02-04 DIAGNOSIS — E890 Postprocedural hypothyroidism: Secondary | ICD-10-CM | POA: Diagnosis not present

## 2016-02-04 DIAGNOSIS — Z23 Encounter for immunization: Secondary | ICD-10-CM | POA: Diagnosis not present

## 2016-02-04 LAB — POCT GLYCOSYLATED HEMOGLOBIN (HGB A1C): Hemoglobin A1C: 8.7

## 2016-02-04 MED ORDER — INSULIN NPH (HUMAN) (ISOPHANE) 100 UNIT/ML ~~LOC~~ SUSP: SUBCUTANEOUS | 11 refills | Status: DC

## 2016-02-04 NOTE — Patient Instructions (Addendum)
Please increase the insulin to 130 units each morning and 70 units each evening.  I have sent a new prescription to your pharmacy.   check your blood sugar twice a day.  vary the time of day when you check, between before the 3 meals, and at bedtime.  also check if you have symptoms of your blood sugar being too high or too low.  please keep a record of the readings and bring it to your next appointment here.  You can write it on any piece of paper.  please call us sooner if your blood sugar goes below 70, or if you have a lot of readings over 200.   On this type of insulin schedule, you should eat meals on a regular schedule (especially lunch).  If a meal is missed or significantly delayed, your blood sugar could go low.   Please come back for a follow-up appointment in 3 months.

## 2016-02-04 NOTE — Progress Notes (Signed)
Subjective:    Patient ID: Alex Rocha, male    DOB: 1955/07/17, 60 y.o.   MRN: [id number]  HPI Pt returns for f/u of diabetes mellitus: DM type: Insulin-requiring type 2. Dx'ed: 2000.  Complications: none.  Therapy: insulin since 2014.  DKA: never.  Severe hypoglycemia: never.   Pancreatitis: never.   Other: he chose a simple and inexpensive insulin regimen; he says his need to care for his wife, who has cancer, limits his ability to care for himself; he declines weight-loss surgery.  Interval history:  Pt says he seldom misses the insulin.  no cbg record, but states cbg's vary from 110-300's.  He says it is still higher fasting than later in the day.  He takes just 120 units qam and 50 units qpm.   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   Hyperthyroidism: he takes synthroid as rx'ed.  Past Medical History:  Diagnosis Date  . Colon polyps   . Diabetes mellitus   . Environmental allergies   . Hyperlipidemia   . Hypertension   . Hyperthyroidism   . Kidney stones   . Low testosterone     No past surgical history on file.  Social History   Social History  . Marital status: Married    Spouse name: N/A  . Number of children: 2  . Years of education: N/A   Occupational History  . probation/parole officer    Social History Main Topics  . Smoking status: Never Smoker  . Smokeless tobacco: Never Used  . Alcohol use 0.0 oz/week     Comment: social  . Drug use: No  . Sexual activity: Not on file   Other Topics Concern  . Not on file   Social History Narrative  . No narrative on file    Current Outpatient Prescriptions on File Prior to Visit  Medication Sig Dispense Refill  . atorvastatin (LIPITOR) 20 MG tablet TAKE 1 TABLET BY MOUTH EVERY DAY (APPT DUE) 30 tablet 4  . B-D INS SYR ULTRAFINE 1CC/31G 31G X 5/16" 1 ML MISC USE TO INJECT INSULIN 2 TIMES PER DAY. 100 each 1  .  fenofibrate 160 MG tablet TAKE 1 TABLET BY MOUTH EVERY DAY (LABS DUE) 30 tablet 4  . levocetirizine (XYZAL) 5 MG tablet Take 1 tablet (5 mg total) by mouth every evening. 30 tablet 5  . levothyroxine (SYNTHROID, LEVOTHROID) 100 MCG tablet TAKE 1 TABLET BY MOUTH ONCE DAILY BEFORE BREAKFAST 90 tablet 2  . losartan (COZAAR) 100 MG tablet TAKE 1 TABLET (100 MG TOTAL) BY MOUTH DAILY. 30 tablet 5  . omeprazole (PRILOSEC) 40 MG capsule TAKE ONE CAPSULE BY MOUTH EVERY DAY 30 capsule 5  . acetic acid (VOSOL) 2 % otic solution 5 drops in affected ear  Tid (Patient not taking: Reported on 02/04/2016) 15 mL 5  . ANDROGEL PUMP 20.25 MG/ACT (1.62%) GEL Reported on 06/18/2015  3  . fluticasone (FLONASE) 50 MCG/ACT nasal spray Place 2 sprays into both nostrils daily. (Patient not taking: Reported on 02/04/2016) 16 g 6   No current facility-administered medications on file prior to visit.     Allergies  Allergen Reactions  . [name] [Yohimbine] Palpitations    Family History  Problem Relation Age of Onset  . Colon cancer Mother   . Heart disease Mother   . Hypertension Mother     Mother's side of the family  .  Diabetes Mother     Mother's side of the family  . Colon cancer Brother   . Hyperlipidemia Brother     Mother's side of the family  . Heart disease Sister   . Diabetes      father's family    BP 132/80   Pulse 78   Ht 6\' 2"  (1.88 m)   Wt (!) 302 lb (137 kg)   SpO2 95%   BMI 38.77 kg/m    Review of Systems He denies hypoglycemia.     Objective:   Physical Exam VITAL SIGNS:  See vs page GENERAL: no distress Pulses: dorsalis pedis intact bilat.   MSK: no deformity of the feet CV: 1+ bilat leg edema Skin:  no ulcer on the feet, but the skin is dry.  normal color and temp on the feet. Neuro: sensation is intact to touch on the feet.  Ext: There is bilateral onychomycosis of the toenails.    A1c=8.7%    Assessment & Plan:  Insulin-requiring type 2 DM: he needs increased rx.    Noncompliance with cbg recording and insulin dosing, persistent.  Patient is advised the following: Patient Instructions  Please increase the insulin to 130 units each morning and 70 units each evening.  I have sent a new prescription to your pharmacy.   check your blood sugar twice a day.  vary the time of day when you check, between before the 3 meals, and at bedtime.  also check if you have symptoms of your blood sugar being too high or too low.  please keep a record of the readings and bring it to your next appointment here.  You can write it on any piece of paper.  please call us sooner if your blood sugar goes below 70, or if you have a lot of readings over 200.   On this type of insulin schedule, you should eat meals on a regular schedule (especially lunch).  If a meal is missed or significantly delayed, your blood sugar could go low.   Please come back for a follow-up appointment in 3 months.

## 2016-02-16 ENCOUNTER — Encounter: Payer: Self-pay | Admitting: Internal Medicine

## 2016-02-19 ENCOUNTER — Other Ambulatory Visit: Payer: Self-pay | Admitting: Endocrinology

## 2016-02-20 NOTE — Progress Notes (Signed)
[facility] Consultation      Assessment and Plan:  Severe Obstructive Sleep Apnea.  --Home sleep study 12/28/15 >> AHI 86.5, SaO2 low 66. -Patient is is feeling much better, he is no longer snoring, he is adequately compliant with CPAP and benefiting from its use.  -We'll change on the setting to 10-25.   Diabetes mellitus.  -Sleep apnea can worsen blood sugar control.  Hypothyroidism.  - continue thyroid hormone replacement.  Essential hypertension. -Sleep apnea can contribute to elevated blood pressure, therefore should remain on CPAP.   Obesity. -This can contribute to sleep apnea, recommended weight loss.  Date: 02/20/2016  MRN# [id number] Alex Rocha Feb 25, 1956  Referring Physician:   Orbin Mayeux is a 60 y.o. old male seen in consultation for chief complaint of:    Chief Complaint  Patient presents with  . Follow-up    pt states he has been wearing cpap 7-8hr nightly, feels pressure & mask are okay. pt waking feeling well rested. DME:[facility]    HPI:   Patient is a 60 yo male who presents with severe daytime sleepiness, symptoms of cognitive dysfunction. He was sent for a home sleep study. Due to his severe daytime sleepiness he was started on a loaner CPAP device. He has since had his sleep study which showed severe OSA with AHI of 86.5   Review of download data shows 97% complaints with average usage of 6 hours and 20 minutes on days used. Patient is on AutoSet from 5-20. 95th percentile pressure is 19, residual AHI was 4.7 without centrals. Review of detailed report shows that the patient appears to hit the ceiling on CPAP pressure of 20, a few times a night.  He is feeling much better, he has started driving again and has no longer felt sleepy, he has been using the machine every night.   Medication:   Reviewed    Allergies:  [name] [yohimbine]  Review of Systems: Gen:  Denies  fever, sweats, chills HEENT: Denies blurred vision,  double vision. Bleeds. Cvc:  No dizziness, chest pain. Resp:   Denies cough or sputum production. Skin: No skin rash,  hives  Endoc:  No polyuria, polydipsia. Psych: No depression, insomnia. Other:  All other systems were reviewed with the patient and were negative other that what is mentioned in the HPI.   Physical Examination:   VS: BP 130/78 (BP Location: Left Arm, Cuff Size: Normal)   Pulse 83   Ht 6\' 2"  (1.88 m)   Wt (!) 303 lb 6.4 oz (137.6 kg)   SpO2 97%   BMI 38.95 kg/m   General Appearance: No distress  Neuro:without focal findings,  speech normal,  HEENT: PERRLA, EOM intact.   Pulmonary: normal breath sounds, No wheezing.  CardiovascularNormal S1,S2.  No m/r/g.   Abdomen: Benign, Soft, non-tender. Renal:  No costovertebral tenderness  GU:  No performed at this time. Endoc: No evident thyromegaly, no signs of acromegaly. Skin:   warm, no rashes, no ecchymosis  Extremities: normal, no cyanosis, clubbing.  Other findings:    LABORATORY PANEL:   CBC No results for input(s): WBC, HGB, HCT, PLT in the last 168 hours. ------------------------------------------------------------------------------------------------------------------  Chemistries  No results for input(s): NA, K, CL, CO2, GLUCOSE, BUN, CREATININE, CALCIUM, MG, AST, ALT, ALKPHOS, BILITOT in the last 168 hours.  Invalid input(s): GFRCGP ------------------------------------------------------------------------------------------------------------------  Cardiac Enzymes No results for input(s): TROPONINI in the last 168 hours. ------------------------------------------------------------  RADIOLOGY:  No results found.     Thank  you for the consultation and for allowing [facility] to assist in the care of your patient. Our recommendations are noted above.  Please contact us if we can be of further service.   Marda Stalker, MD.  Board Certified in Internal Medicine,  Pulmonary Medicine, [place], and Sleep Medicine.  [facility] Office Number: [phone]  Patricia Pesa, M.D.  Vilinda Boehringer, M.D.  Merton Border, M.D  02/20/2016

## 2016-02-21 ENCOUNTER — Encounter: Payer: Self-pay | Admitting: Internal Medicine

## 2016-02-21 ENCOUNTER — Ambulatory Visit (INDEPENDENT_AMBULATORY_CARE_PROVIDER_SITE_OTHER): Payer: BC Managed Care – PPO | Admitting: Internal Medicine

## 2016-02-21 VITALS — BP 130/78 | HR 83 | Ht 74.0 in | Wt 303.4 lb

## 2016-02-21 DIAGNOSIS — G4733 Obstructive sleep apnea (adult) (pediatric): Secondary | ICD-10-CM

## 2016-02-21 NOTE — Patient Instructions (Signed)
Change autoPAP pressure range to 10-25.

## 2016-02-21 NOTE — Addendum Note (Signed)
Addended by: Maryanna Shape A on: 02/21/2016 10:23 AM   Modules accepted: Orders

## 2016-02-23 ENCOUNTER — Other Ambulatory Visit: Payer: Self-pay | Admitting: Family Medicine

## 2016-03-15 ENCOUNTER — Other Ambulatory Visit: Payer: Self-pay

## 2016-03-15 DIAGNOSIS — J069 Acute upper respiratory infection, unspecified: Secondary | ICD-10-CM

## 2016-03-15 MED ORDER — LEVOCETIRIZINE DIHYDROCHLORIDE 5 MG PO TABS: 5.0000 mg | ORAL_TABLET | Freq: Every evening | ORAL | 0 refills | Status: DC

## 2016-05-05 ENCOUNTER — Ambulatory Visit: Payer: BC Managed Care – PPO | Admitting: Endocrinology

## 2016-05-05 ENCOUNTER — Other Ambulatory Visit: Payer: Self-pay

## 2016-05-05 DIAGNOSIS — J069 Acute upper respiratory infection, unspecified: Secondary | ICD-10-CM

## 2016-05-05 MED ORDER — LEVOTHYROXINE SODIUM 100 MCG PO TABS: ORAL_TABLET | ORAL | 2 refills | Status: DC

## 2016-05-05 MED ORDER — FLUTICASONE PROPIONATE 50 MCG/ACT NA SUSP: 2.0000 | Freq: Every day | NASAL | 6 refills | Status: DC

## 2016-05-05 MED ORDER — LOSARTAN POTASSIUM 100 MG PO TABS
100.0000 mg | ORAL_TABLET | Freq: Every day | ORAL | 5 refills | Status: DC
Start: 2016-05-05 — End: 2016-05-09

## 2016-05-07 ENCOUNTER — Emergency Department (HOSPITAL_BASED_OUTPATIENT_CLINIC_OR_DEPARTMENT_OTHER)
Admission: EM | Admit: 2016-05-07 | Discharge: 2016-05-07 | Disposition: A | Discharge status: Home / Self Care | Payer: BC Managed Care – PPO | Attending: Emergency Medicine | Admitting: Emergency Medicine

## 2016-05-07 ENCOUNTER — Encounter (HOSPITAL_BASED_OUTPATIENT_CLINIC_OR_DEPARTMENT_OTHER): Payer: Self-pay

## 2016-05-07 DIAGNOSIS — E119 Type 2 diabetes mellitus without complications: Secondary | ICD-10-CM | POA: Diagnosis not present

## 2016-05-07 DIAGNOSIS — L249 Irritant contact dermatitis, unspecified cause: Secondary | ICD-10-CM | POA: Insufficient documentation

## 2016-05-07 DIAGNOSIS — Z7984 Long term (current) use of oral hypoglycemic drugs: Secondary | ICD-10-CM | POA: Insufficient documentation

## 2016-05-07 DIAGNOSIS — Z79899 Other long term (current) drug therapy: Secondary | ICD-10-CM | POA: Insufficient documentation

## 2016-05-07 DIAGNOSIS — I10 Essential (primary) hypertension: Secondary | ICD-10-CM | POA: Insufficient documentation

## 2016-05-07 DIAGNOSIS — M79642 Pain in left hand: Secondary | ICD-10-CM | POA: Diagnosis present

## 2016-05-07 MED ORDER — TRIAMCINOLONE ACETONIDE 0.1 % EX CREA: 1.0000 "application " | TOPICAL_CREAM | Freq: Two times a day (BID) | CUTANEOUS | 0 refills | Status: DC

## 2016-05-07 NOTE — Discharge Instructions (Signed)
Avoid applying bengay or DMSO to your hand until it heals, this may take a week or two.  Follow up with your primary care doctor.  You may apply Kenalog twice daily to help with itching and irritation.  Return to the ED if you notice any pus drainage, increased redness, fever, or any new or concerning symptoms.

## 2016-05-07 NOTE — ED Provider Notes (Signed)
[facility] Provider Note   CSN: [id number] Arrival date & time: 05/07/16  0807     History   Chief Complaint Chief Complaint  Patient presents with  . Hand Burn    HPI Alex Rocha is a 61 y.o. male.  HPI Alex Rocha is a 61 y.o. male with PMH significant for DM who presents with left hand irritation.  Patient states he's been using DMSO gel for arthritic pain. He states he put his hand in a glove filled with BenGay yesterday and began experiencing a burning pain. His symptoms have completely resolved after he washed his hands. No fever, chils, drainage, open wounds. Symptom onset sudden, constant, and improved.  Past Medical History:  Diagnosis Date  . Colon polyps   . Diabetes mellitus   . Environmental allergies   . Hyperlipidemia   . Hypertension   . Hyperthyroidism   . Kidney stones   . Low testosterone     Patient Active Problem List   Diagnosis Date Noted  . Sleep disorder 12/06/2015  . Diabetes ([place]) 06/19/2015  . Light headedness 01/11/2015  . Gastroesophageal reflux disease without esophagitis 02/13/2014  . Gout 01/27/2014  . 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 Sinusitis 08/25/2013  . Multinodular goiter 01/22/2013  . HTN (hypertension) 11/25/2012  . Hyperlipidemia 11/25/2012  . Left otitis media 11/25/2012  . Low testosterone 11/25/2012    History reviewed. No pertinent surgical history.     Home Medications    Prior to Admission medications   Medication Sig Start Date End Date Taking? Authorizing Provider  acetic acid (VOSOL) 2 % otic solution 5 drops in affected ear  Tid 02/19/13   Rosalita Chessman Chase, DO  ANDROGEL PUMP 20.25 MG/ACT (1.62%) GEL Reported on 06/18/2015 12/02/14   Historical Provider, MD  atorvastatin (LIPITOR) 20 MG tablet TAKE 1 TABLET BY MOUTH EVERY DAY (APPT DUE) 01/26/16   Colon Branch, MD  B-D INS SYR ULTRAFINE 1CC/31G 31G X 5/16" 1 ML MISC USE TO INJECT INSULIN 2 TIMES PER DAY. 03/24/15    Renato Shin, MD  fenofibrate 160 MG tablet TAKE 1 TABLET BY MOUTH EVERY DAY (LABS DUE) 01/26/16   Colon Branch, MD  fluticasone [facility]) 50 MCG/ACT nasal spray Place 2 sprays into both nostrils daily. 05/05/16   Rosalita Chessman Chase, DO  insulin NPH Human (NOVOLIN N) 100 UNIT/ML injection 130 units each morning and 70 units each evening, and syringes 2/day 02/04/16   Renato Shin, MD  levocetirizine (XYZAL) 5 MG tablet Take 1 tablet (5 mg total) by mouth every evening. 03/15/16   Rosalita Chessman Chase, DO  levothyroxine (SYNTHROID, LEVOTHROID) 100 MCG tablet TAKE 1 TABLET BY MOUTH ONCE DAILY BEFORE BREAKFAST 05/05/16   Alferd Apa [name] Chase, DO  losartan (COZAAR) 100 MG tablet Take 1 tablet (100 mg total) by mouth daily. 05/05/16   Rosalita Chessman Chase, DO  omeprazole (PRILOSEC) 40 MG capsule TAKE ONE CAPSULE BY MOUTH EVERY DAY 02/23/16   Rosalita Chessman Chase, DO  triamcinolone cream (KENALOG) 0.1 % Apply 1 application topically 2 (two) times daily. 05/07/16   Gloriann Loan, PA-C    Family History Family History  Problem Relation Age of Onset  . Colon cancer Mother   . Heart disease Mother   . Hypertension Mother     Mother's side of the family  . Diabetes Mother     Mother's side of the family  . Colon cancer Brother   . Hyperlipidemia  Brother     Mother's side of the family  . Heart disease Sister   . Diabetes      father's family    Social History Social History  Substance Use Topics  . Smoking status: Never Smoker  . Smokeless tobacco: Never Used  . Alcohol use 0.0 oz/week     Comment: social     Allergies   [name] [yohimbine]   Review of Systems Review of Systems All other systems negative unless otherwise stated in HPI   Physical Exam Updated Vital Signs BP 147/65 (BP Location: Right Arm)   Pulse 77   Temp 98.5 F (36.9 C) (Oral)   Resp 18   Ht 6\' 2"  (1.88 m)   Wt 136.1 kg   SpO2 97%   BMI 38.52 kg/m   Physical Exam  Constitutional: He is oriented to person,  place, and time. He appears well-developed and well-nourished.  HENT:  Head: Normocephalic and atraumatic.  Right Ear: External ear normal.  Left Ear: External ear normal.  Eyes: Conjunctivae are normal. No scleral icterus.  Neck: No tracheal deviation present.  Cardiovascular:  Pulses:      Radial pulses are 2+ on the right side, and 2+ on the left side.  Brisk capillary refill.   Pulmonary/Chest: Effort normal. No respiratory distress.  Abdominal: He exhibits no distension.  Musculoskeletal: Normal range of motion.  Compartments are soft and compressible. Moves all extremities spontaneously and without difficulty.  Neurological: He is alert and oriented to person, place, and time.  Skin: Skin is warm and dry.  No open wounds.  Irritant dermatitis to left 2nd, 3rd, 4th MCPs and proximal fingers.   Psychiatric: He has a normal mood and affect. His behavior is normal.     ED Treatments / Results  Labs (all labs ordered are listed, but only abnormal results are displayed) Labs Reviewed - No data to display  EKG  EKG Interpretation None       Radiology No results found.  Procedures Procedures (including critical care time)  Medications Ordered in ED Medications - No data to display   Initial Impression / Assessment and Plan / ED Course  I have reviewed the triage vital signs and the nursing notes.  Pertinent labs & imaging results that were available during my care of the patient were reviewed by me and considered in my medical decision making (see chart for details).      Patient with contact dermatitis. Instructed to avoid offending agent and to use unscented soaps, lotions, and detergents. Will treat with kenalog.  No signs of secondary infection. Follow up with PCP in 2-3 days. Return precautions discussed. Pt is safe for discharge at this time.        Final Clinical Impressions(s) / ED Diagnoses   Final diagnoses:  Irritant dermatitis    New  Prescriptions New Prescriptions   TRIAMCINOLONE CREAM (KENALOG) 0.1 %    Apply 1 application topically 2 (two) times daily.     Gloriann Loan, PA-C 05/07/16 [place], MD 05/08/16 [phone]

## 2016-05-07 NOTE — ED Triage Notes (Signed)
Pt put DMSO on left hand yesterday for arthritic pain and then had hand in glove soaked in Bengay. Pt has no pain at this time, just alteration/hardening of skin. (+) Pulses.

## 2016-05-09 ENCOUNTER — Other Ambulatory Visit: Payer: Self-pay | Admitting: *Deleted

## 2016-05-09 DIAGNOSIS — J069 Acute upper respiratory infection, unspecified: Secondary | ICD-10-CM

## 2016-05-09 MED ORDER — FLUTICASONE PROPIONATE 50 MCG/ACT NA SUSP: 2.0000 | Freq: Every day | NASAL | 1 refills | Status: DC

## 2016-05-09 MED ORDER — LOSARTAN POTASSIUM 100 MG PO TABS: 100.0000 mg | ORAL_TABLET | Freq: Every day | ORAL | 1 refills | Status: DC

## 2016-05-09 MED ORDER — LEVOCETIRIZINE DIHYDROCHLORIDE 5 MG PO TABS: 5.0000 mg | ORAL_TABLET | Freq: Every evening | ORAL | 1 refills | Status: DC

## 2016-05-29 ENCOUNTER — Ambulatory Visit (INDEPENDENT_AMBULATORY_CARE_PROVIDER_SITE_OTHER): Payer: BC Managed Care – PPO | Admitting: Family Medicine

## 2016-05-29 ENCOUNTER — Encounter: Payer: Self-pay | Admitting: Family Medicine

## 2016-05-29 VITALS — BP 136/70 | HR 76 | Temp 99.2°F | Resp 16 | Ht 74.0 in | Wt 302.6 lb

## 2016-05-29 DIAGNOSIS — M654 Radial styloid tenosynovitis [de Quervain]: Secondary | ICD-10-CM | POA: Insufficient documentation

## 2016-05-29 DIAGNOSIS — M1990 Unspecified osteoarthritis, unspecified site: Secondary | ICD-10-CM | POA: Diagnosis not present

## 2016-05-29 DIAGNOSIS — E785 Hyperlipidemia, unspecified: Secondary | ICD-10-CM | POA: Diagnosis not present

## 2016-05-29 NOTE — Assessment & Plan Note (Signed)
Pt to get splint at pharmacy Tylenol arthritis To ortho if no improvement

## 2016-05-29 NOTE — Assessment & Plan Note (Signed)
L hand ---

## 2016-05-29 NOTE — Progress Notes (Signed)
Pre visit review using our clinic review tool, if applicable. No additional management support is needed unless otherwise documented below in the visit note. 

## 2016-05-29 NOTE — Progress Notes (Signed)
Subjective:    Patient ID: Alex Rocha, male    DOB: [birth year], 61 y.o.   MRN: [id number]  Chief Complaint  Patient presents with  . Hand Pain    both hands for 2 months left hand pain the fingers and the right hand its the thumb    HPI Patient is in today for hand pain for the past 2 months.  The left hand pain is located on the fingers.  The right hand the pain is located at the thumb.  Past Medical History:  Diagnosis Date  . Colon polyps   . Diabetes mellitus   . Environmental allergies   . Hyperlipidemia   . Hypertension   . Hyperthyroidism   . Kidney stones   . Low testosterone     No past surgical history on file.  Family History  Problem Relation Age of Onset  . Colon cancer Mother   . Heart disease Mother   . Hypertension Mother     Mother's side of the family  . Diabetes Mother     Mother's side of the family  . Colon cancer Brother   . Hyperlipidemia Brother     Mother's side of the family  . Heart disease Sister   . Diabetes      father's family    Social History   Social History  . Marital status: Married    Spouse name: N/A  . Number of children: 2  . Years of education: N/A   Occupational History  . probation/parole officer    Social History Main Topics  . Smoking status: Never Smoker  . Smokeless tobacco: Never Used  . Alcohol use 0.0 oz/week     Comment: social  . Drug use: No  . Sexual activity: Not on file   Other Topics Concern  . Not on file   Social History Narrative  . No narrative on file    Outpatient Medications Prior to Visit  Medication Sig Dispense Refill  . atorvastatin (LIPITOR) 20 MG tablet TAKE 1 TABLET BY MOUTH EVERY DAY (APPT DUE) 30 tablet 4  . B-D INS SYR ULTRAFINE 1CC/31G 31G X 5/16" 1 ML MISC USE TO INJECT INSULIN 2 TIMES PER DAY. 100 each 1  . fenofibrate 160 MG tablet TAKE 1 TABLET BY MOUTH EVERY DAY (LABS DUE) 30 tablet 4  . fluticasone (FLONASE) 50 MCG/ACT nasal spray Place 2 sprays into both  nostrils daily. 48 g 1  . insulin NPH Human (NOVOLIN N) 100 UNIT/ML injection 130 units each morning and 70 units each evening, and syringes 2/day 70 mL 11  . levocetirizine (XYZAL) 5 MG tablet Take 1 tablet (5 mg total) by mouth every evening. 90 tablet 1  . levothyroxine (SYNTHROID, LEVOTHROID) 100 MCG tablet TAKE 1 TABLET BY MOUTH ONCE DAILY BEFORE BREAKFAST 90 tablet 2  . losartan (COZAAR) 100 MG tablet Take 1 tablet (100 mg total) by mouth daily. 90 tablet 1  . omeprazole (PRILOSEC) 40 MG capsule TAKE ONE CAPSULE BY MOUTH EVERY DAY 30 capsule 5  . triamcinolone cream (KENALOG) 0.1 % Apply 1 application topically 2 (two) times daily. 30 g 0  . acetic acid (VOSOL) 2 % otic solution 5 drops in affected ear  Tid 15 mL 5  . ANDROGEL PUMP 20.25 MG/ACT (1.62%) GEL Reported on 06/18/2015  3   No facility-administered medications prior to visit.     Allergies  Allergen Reactions  . [name] [Yohimbine] Palpitations    Review of Systems  Constitutional: Negative for chills, fever and malaise/fatigue.  HENT: Negative for congestion and hearing loss.   Eyes: Negative for discharge.  Respiratory: Negative for cough, sputum production and shortness of breath.   Cardiovascular: Negative for chest pain, palpitations and leg swelling.  Gastrointestinal: Negative for abdominal pain, blood in stool, constipation, diarrhea, heartburn, nausea and vomiting.  Genitourinary: Negative for dysuria, frequency, hematuria and urgency.  Musculoskeletal: Positive for joint pain. Negative for back pain, falls and myalgias.  Skin: Negative for rash.  Neurological: Negative for dizziness, sensory change, loss of consciousness, weakness and headaches.  Endo/Heme/Allergies: Negative for environmental allergies. Does not bruise/bleed easily.  Psychiatric/Behavioral: Negative for depression and suicidal ideas. The patient is not nervous/anxious and does not have insomnia.        Objective:    Physical Exam    Constitutional: He is oriented to person, place, and time. Vital signs are normal. He appears well-developed and well-nourished. He is sleeping.  HENT:  Head: Normocephalic and atraumatic.  Mouth/Throat: Oropharynx is clear and moist.  Eyes: EOM are normal. Pupils are equal, round, and reactive to light.  Neck: Normal range of motion. Neck supple. No thyromegaly present.  Cardiovascular: Normal rate and regular rhythm.   No murmur heard. Pulmonary/Chest: Effort normal and breath sounds normal. No respiratory distress. He has no wheezes. He has no rales. He exhibits no tenderness.  Musculoskeletal: He exhibits no edema or tenderness.  Neurological: He is alert and oriented to person, place, and time.  Skin: Skin is warm and dry.  Psychiatric: He has a normal mood and affect. His behavior is normal. Judgment and thought content normal.  Nursing note and vitals reviewed.   BP 136/70 (BP Location: Left Arm, Cuff Size: Large)   Pulse 76   Temp 99.2 F (37.3 C) (Oral)   Resp 16   Ht 6\' 2"  (1.88 m)   Wt (!) 302 lb 9.6 oz (137.3 kg)   SpO2 98%   BMI 38.85 kg/m  Wt Readings from Last 3 Encounters:  05/29/16 (!) 302 lb 9.6 oz (137.3 kg)  05/07/16 300 lb (136.1 kg)  02/21/16 (!) 303 lb 6.4 oz (137.6 kg)     Lab Results  Component Value Date   WBC 6.7 12/29/2014   HGB 13.8 12/29/2014   HCT 41.3 12/29/2014   PLT 206 12/29/2014   GLUCOSE 263 (H) 11/18/2015   CHOL 153 11/18/2015   TRIG 265.0 (H) 11/18/2015   HDL 30.70 (L) 11/18/2015   LDLDIRECT 78.0 11/18/2015   LDLCALC 78 08/06/2015   ALT 36 11/18/2015   AST 32 11/18/2015   NA 138 11/18/2015   K 4.6 11/18/2015   CL 104 11/18/2015   CREATININE 1.49 11/18/2015   BUN 31 (H) 11/18/2015   CO2 28 11/18/2015   TSH 2.50 06/18/2015   HGBA1C 8.7 02/04/2016   MICROALBUR 0.3 06/18/2015    Lab Results  Component Value Date   TSH 2.50 06/18/2015   Lab Results  Component Value Date   WBC 6.7 12/29/2014   HGB 13.8 12/29/2014    HCT 41.3 12/29/2014   MCV 92.0 12/29/2014   PLT 206 12/29/2014   Lab Results  Component Value Date   NA 138 11/18/2015   K 4.6 11/18/2015   CO2 28 11/18/2015   GLUCOSE 263 (H) 11/18/2015   BUN 31 (H) 11/18/2015   CREATININE 1.49 11/18/2015   BILITOT 0.3 11/18/2015   ALKPHOS 49 11/18/2015   AST 32 11/18/2015   ALT 36 11/18/2015   PROT  7.6 11/18/2015   ALBUMIN 4.5 11/18/2015   CALCIUM 9.7 11/18/2015   ANIONGAP 8 12/29/2014   GFR 62.33 11/18/2015   Lab Results  Component Value Date   CHOL 153 11/18/2015   Lab Results  Component Value Date   HDL 30.70 (L) 11/18/2015   Lab Results  Component Value Date   LDLCALC 78 08/06/2015   Lab Results  Component Value Date   TRIG 265.0 (H) 11/18/2015   Lab Results  Component Value Date   CHOLHDL 5 11/18/2015   Lab Results  Component Value Date   HGBA1C 8.7 02/04/2016        Assessment & Plan:   Problem List Items Addressed This Visit      Unprioritized   Arthritis - Primary    L hand ---       Relevant Orders   Rheumatoid Factor   Comprehensive metabolic panel   De Quervain's tenosynovitis, right    Pt to get splint at pharmacy Tylenol arthritis To ortho if no improvement       Other Visit Diagnoses    Hyperlipidemia LDL goal <70       Relevant Orders   Lipid panel      I have discontinued Mr. Fleischer acetic acid and ANDROGEL PUMP. I am also having him maintain his B-D INS SYR ULTRAFINE 1CC/31G, fenofibrate, atorvastatin, insulin NPH Human, omeprazole, levothyroxine, triamcinolone cream, losartan, levocetirizine, and fluticasone.  No orders of the defined types were placed in this encounter.   CMA served as Education administrator during this visit. History, Physical and Plan performed by medical provider. Documentation and orders reviewed and attested to.  Ann Held, DO

## 2016-05-29 NOTE — Patient Instructions (Signed)
Arthritis Introduction Arthritis is a term that is commonly used to refer to joint pain or joint disease. There are more than 100 types of arthritis. What are the causes? The most common cause of this condition is wear and tear of a joint. Other causes include:  Gout.  Inflammation of a joint.  An infection of a joint.  Sprains and other injuries near the joint.  A drug reaction or allergic reaction. In some cases, the cause may not be known. What are the signs or symptoms? The main symptom of this condition is pain in the joint with movement. Other symptoms include:  Redness, swelling, or stiffness at a joint.  Warmth coming from the joint.  Fever.  Overall feeling of illness. How is this diagnosed? This condition may be diagnosed with a physical exam and tests, including:  Blood tests.  Urine tests.  Imaging tests, such as MRI, X-rays, or a CT scan. Sometimes, fluid is removed from a joint for testing. How is this treated? Treatment for this condition may involve:  Treatment of the cause, if it is known.  Rest.  Raising (elevating) the joint.  Applying cold or hot packs to the joint.  Medicines to improve symptoms and reduce inflammation.  Injections of a steroid such as cortisone into the joint to help reduce pain and inflammation. Depending on the cause of your arthritis, you may need to make lifestyle changes to reduce stress on your joint. These changes may include exercising more and losing weight. Follow these instructions at home: Medicines  Take over-the-counter and prescription medicines only as told by your health care provider.  Do not take aspirin to relieve pain if gout is suspected. Activity  Rest your joint if told by your health care provider. Rest is important when your disease is active and your joint feels painful, swollen, or stiff.  Avoid activities that make the pain worse. It is important to balance activity with rest.  Exercise  your joint regularly with range-of-motion exercises as told by your health care provider. Try doing low-impact exercise, such as:  Swimming.  Water aerobics.  Biking.  Walking. Joint Care   If your joint is swollen, keep it elevated if told by your health care provider.  If your joint feels stiff in the morning, try taking a warm shower.  If directed, apply heat to the joint. If you have diabetes, do not apply heat without permission from your health care provider.  Put a towel between the joint and the hot pack or heating pad.  Leave the heat on the area for 20-30 minutes.  If directed, apply ice to the joint:  Put ice in a plastic bag.  Place a towel between your skin and the bag.  Leave the ice on for 20 minutes, 2-3 times per day.  Keep all follow-up visits as told by your health care provider. This is important. Contact a health care provider if:  The pain gets worse.  You have a fever. Get help right away if:  You develop severe joint pain, swelling, or redness.  Many joints become painful and swollen.  You develop severe back pain.  You develop severe weakness in your leg.  You cannot control your bladder or bowels. This information is not intended to replace advice given to you by your health care provider. Make sure you discuss any questions you have with your health care provider. Document Released: 05/11/2004 Document Revised: 09/09/2015 Document Reviewed: 06/29/2014  2017 Elsevier

## 2016-05-30 LAB — COMPREHENSIVE METABOLIC PANEL
ALT: 30 U/L (ref 0–53)
AST: 26 U/L (ref 0–37)
Albumin: 4.5 g/dL (ref 3.5–5.2)
Alkaline Phosphatase: 50 U/L (ref 39–117)
BUN: 18 mg/dL (ref 6–23)
CO2: 28 mEq/L (ref 19–32)
Calcium: 9.6 mg/dL (ref 8.4–10.5)
Chloride: 107 mEq/L (ref 96–112)
Creatinine, Ser: 1.33 mg/dL (ref 0.40–1.50)
GFR: 70.93 mL/min (ref >60.00)
Glucose, Bld: 132 mg/dL — ABNORMAL HIGH (ref 70–99)
Potassium: 4.4 mEq/L (ref 3.5–5.1)
Sodium: 141 mEq/L (ref 135–145)
Total Bilirubin: 0.4 mg/dL (ref 0.2–1.2)
Total Protein: 7.4 g/dL (ref 6.0–8.3)

## 2016-05-30 LAB — LIPID PANEL
Cholesterol: 128 mg/dL (ref 0–200)
HDL: 30.2 mg/dL — ABNORMAL LOW (ref >39.00)
LDL Cholesterol: 70 mg/dL (ref 0–99)
NonHDL: 98.25
Total CHOL/HDL Ratio: 4
Triglycerides: 140 mg/dL (ref 0.0–149.0)
VLDL: 28 mg/dL (ref 0.0–40.0)

## 2016-05-30 LAB — RHEUMATOID FACTOR: Rhuematoid fact SerPl-aCnc: <14 IU/mL (ref <14)

## 2016-06-03 ENCOUNTER — Other Ambulatory Visit: Payer: Self-pay | Admitting: Family Medicine

## 2016-06-05 ENCOUNTER — Other Ambulatory Visit: Payer: Self-pay | Admitting: Family Medicine

## 2016-06-05 DIAGNOSIS — E785 Hyperlipidemia, unspecified: Secondary | ICD-10-CM

## 2016-06-09 ENCOUNTER — Encounter: Payer: Self-pay | Admitting: Family Medicine

## 2016-06-12 ENCOUNTER — Other Ambulatory Visit: Payer: Self-pay | Admitting: Family Medicine

## 2016-06-12 ENCOUNTER — Telehealth: Payer: Self-pay

## 2016-06-12 MED ORDER — DICLOFENAC SODIUM 1 % TD GEL: 4.0000 g | Freq: Four times a day (QID) | TRANSDERMAL | 1 refills | Status: DC

## 2016-06-12 NOTE — Telephone Encounter (Signed)
Received PA approval through 06/13/2019. PA notification sent for scanning.

## 2016-06-12 NOTE — Telephone Encounter (Signed)
PA initiated via Covermymeds; KEY: [id number]. Awaiting determination.

## 2016-06-12 NOTE — Telephone Encounter (Signed)
voltaren gel 4g  Qid

## 2016-07-06 ENCOUNTER — Other Ambulatory Visit: Payer: Self-pay | Admitting: Internal Medicine

## 2016-07-14 ENCOUNTER — Other Ambulatory Visit: Payer: Self-pay | Admitting: Family Medicine

## 2016-08-14 ENCOUNTER — Encounter: Payer: Self-pay | Admitting: Endocrinology

## 2016-08-14 ENCOUNTER — Ambulatory Visit (INDEPENDENT_AMBULATORY_CARE_PROVIDER_SITE_OTHER): Payer: BC Managed Care – PPO | Admitting: Endocrinology

## 2016-08-14 VITALS — BP 126/68 | HR 85 | Ht 74.0 in | Wt 301.0 lb

## 2016-08-14 DIAGNOSIS — M25549 Pain in joints of unspecified hand: Secondary | ICD-10-CM

## 2016-08-14 DIAGNOSIS — E890 Postprocedural hypothyroidism: Secondary | ICD-10-CM | POA: Diagnosis not present

## 2016-08-14 DIAGNOSIS — E119 Type 2 diabetes mellitus without complications: Secondary | ICD-10-CM | POA: Diagnosis not present

## 2016-08-14 DIAGNOSIS — R202 Paresthesia of skin: Secondary | ICD-10-CM

## 2016-08-14 DIAGNOSIS — M2550 Pain in unspecified joint: Secondary | ICD-10-CM | POA: Insufficient documentation

## 2016-08-14 LAB — POCT GLYCOSYLATED HEMOGLOBIN (HGB A1C): Hemoglobin A1C: 8.6

## 2016-08-14 LAB — VITAMIN B12: Vitamin B-12: 477 pg/mL (ref 211–911)

## 2016-08-14 LAB — TSH: TSH: 2.08 u[IU]/mL (ref 0.35–4.50)

## 2016-08-14 LAB — SEDIMENTATION RATE: Sed Rate: 9 mm/hr (ref 0–20)

## 2016-08-14 MED ORDER — INSULIN NPH (HUMAN) (ISOPHANE) 100 UNIT/ML ~~LOC~~ SUSP: SUBCUTANEOUS | 11 refills | Status: DC

## 2016-08-14 NOTE — Progress Notes (Signed)
Subjective:    Patient ID: Alex Rocha, male    DOB: [birth year], 61 y.o.   MRN: [id number]  HPI Pt returns for f/u of diabetes mellitus: DM type: Insulin-requiring type 2. Dx'ed: 2000.  Complications: polyneuropathy.   Therapy: insulin since 2014.  DKA: never.  Severe hypoglycemia: never.   Pancreatitis: never.   Other: he chose a simple and inexpensive insulin regimen; he says his need to care for his wife, who has cancer, limits his ability to care for himself; he declines weight-loss surgery.  Interval history:  Pt says he seldom misses the insulin.  no cbg record, but states cbg's are slight higher fasting than later in the day.  He takes just 130 units qam and 70 units qpm.  He seldom has hypoglycemia, and these episodes are mild. 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  He does not notice the goiter.   Hypothyroidism (since RAI): he takes synthroid as rx'ed.   Past Medical History:  Diagnosis Date  . Colon polyps   . Diabetes mellitus   . Environmental allergies   . Hyperlipidemia   . Hypertension   . Hyperthyroidism   . Kidney stones   . Low testosterone     No past surgical history on file.  Social History   Social History  . Marital status: Married    Spouse name: N/A  . Number of children: 2  . Years of education: N/A   Occupational History  . probation/parole officer    Social History Main Topics  . Smoking status: Never Smoker  . Smokeless tobacco: Never Used  . Alcohol use 0.0 oz/week     Comment: social  . Drug use: No  . Sexual activity: Not on file   Other Topics Concern  . Not on file   Social History Narrative  . No narrative on file    Current Outpatient Prescriptions on File Prior to Visit  Medication Sig Dispense Refill  . atorvastatin (LIPITOR) 20 MG tablet TAKE 1 TABLET BY MOUTH EVERY DAY (APPT DUE) 30 tablet 4  . B-D INS SYR ULTRAFINE 1CC/31G 31G X  5/16" 1 ML MISC USE TO INJECT INSULIN 2 TIMES PER DAY. 100 each 1  . diclofenac sodium (VOLTAREN) 1 % GEL Apply 4 g topically 4 (four) times daily. 100 g 1  . fenofibrate 160 MG tablet TAKE 1 TABLET BY MOUTH EVERY DAY (LABS DUE) 30 tablet 4  . fluticasone (FLONASE) 50 MCG/ACT nasal spray Place 2 sprays into both nostrils daily. 48 g 1  . levocetirizine (XYZAL) 5 MG tablet Take 1 tablet (5 mg total) by mouth every evening. 90 tablet 1  . levothyroxine (SYNTHROID, LEVOTHROID) 100 MCG tablet TAKE 1 TABLET BY MOUTH ONCE DAILY BEFORE BREAKFAST 90 tablet 2  . losartan (COZAAR) 100 MG tablet TAKE 1 TABLET (100 MG TOTAL) BY MOUTH DAILY. 30 tablet 11  . omeprazole (PRILOSEC) 40 MG capsule TAKE ONE CAPSULE BY MOUTH EVERY DAY 30 capsule 5  . triamcinolone cream (KENALOG) 0.1 % Apply 1 application topically 2 (two) times daily. 30 g 0  . losartan (COZAAR) 100 MG tablet Take 1 tablet (100 mg total) by mouth daily. (Patient not taking: Reported on 08/14/2016) 90 tablet 1   No current facility-administered medications on file prior to visit.     Allergies  Allergen Reactions  . [name] [Yohimbine] Palpitations    Family History  Problem Relation  Age of Onset  . Colon cancer Mother   . Heart disease Mother   . Hypertension Mother     Mother's side of the family  . Diabetes Mother     Mother's side of the family  . Colon cancer Brother   . Hyperlipidemia Brother     Mother's side of the family  . Heart disease Sister   . Diabetes      father's family    BP 126/68   Pulse 85   Ht 6\' 2"  (1.88 m)   Wt (!) 301 lb (136.5 kg)   SpO2 96%   BMI 38.65 kg/m    Review of Systems Denies LOC, but he has tingling of the toes.  He has chronic arthralgias.      Objective:   Physical Exam VITAL SIGNS:  See vs page GENERAL: no distress NECK: There is no palpable thyroid enlargement.  No thyroid nodule is palpable.  No palpable lymphadenopathy at the anterior neck.  Pulses: dorsalis pedis intact  bilat.   MSK: no deformity of the feet CV: trace bilat leg edema Skin:  no ulcer on the feet.  normal color and temp on the feet. Neuro: sensation is intact to touch on the feet.  Ext: There is bilateral onychomycosis of the toenails.   a1c=8.6%    Assessment & Plan:  Insulin-requiring type 2 DM: he needs increased rx Arthralgias, new. Check labs Tingling, new, prob due to polyneuropathy.  Check B-12 Hypothyroidism: check TSH  Patient Instructions  Please increase the insulin to 130 units each morning and 80 units each evening.  I have sent a new prescription to your pharmacy.   check your blood sugar twice a day.  vary the time of day when you check, between before the 3 meals, and at bedtime.  also check if you have symptoms of your blood sugar being too high or too low.  please keep a record of the readings and bring it to your next appointment here.  You can write it on any piece of paper.  please call us sooner if your blood sugar goes below 70, or if you have a lot of readings over 200.   On this type of insulin schedule, you should eat meals on a regular schedule (especially lunch).  If a meal is missed or significantly delayed, your blood sugar could go low.   blood tests are requested for you today.  We'll let you know about the results. In my opinion, you can wait until next year to recheck the thyroid ultrasound Please come back for a follow-up appointment in 2-3 months.

## 2016-08-14 NOTE — Patient Instructions (Addendum)
Please increase the insulin to 130 units each morning and 80 units each evening.  I have sent a new prescription to your pharmacy.   check your blood sugar twice a day.  vary the time of day when you check, between before the 3 meals, and at bedtime.  also check if you have symptoms of your blood sugar being too high or too low.  please keep a record of the readings and bring it to your next appointment here.  You can write it on any piece of paper.  please call us sooner if your blood sugar goes below 70, or if you have a lot of readings over 200.   On this type of insulin schedule, you should eat meals on a regular schedule (especially lunch).  If a meal is missed or significantly delayed, your blood sugar could go low.   blood tests are requested for you today.  We'll let you know about the results. In my opinion, you can wait until next year to recheck the thyroid ultrasound Please come back for a follow-up appointment in 2-3 months.

## 2016-08-15 LAB — RHEUMATOID FACTOR: Rhuematoid fact SerPl-aCnc: <14 IU/mL (ref <14)

## 2016-09-10 ENCOUNTER — Other Ambulatory Visit: Payer: Self-pay | Admitting: Internal Medicine

## 2016-09-15 ENCOUNTER — Other Ambulatory Visit: Payer: Self-pay | Admitting: Endocrinology

## 2016-12-04 ENCOUNTER — Ambulatory Visit (INDEPENDENT_AMBULATORY_CARE_PROVIDER_SITE_OTHER): Payer: BC Managed Care – PPO | Admitting: Endocrinology

## 2016-12-04 ENCOUNTER — Encounter: Payer: Self-pay | Admitting: Endocrinology

## 2016-12-04 ENCOUNTER — Other Ambulatory Visit (INDEPENDENT_AMBULATORY_CARE_PROVIDER_SITE_OTHER): Payer: BC Managed Care – PPO

## 2016-12-04 VITALS — BP 122/70 | HR 77 | Wt 301.0 lb

## 2016-12-04 DIAGNOSIS — E890 Postprocedural hypothyroidism: Secondary | ICD-10-CM | POA: Diagnosis not present

## 2016-12-04 DIAGNOSIS — Z794 Long term (current) use of insulin: Secondary | ICD-10-CM

## 2016-12-04 DIAGNOSIS — E119 Type 2 diabetes mellitus without complications: Secondary | ICD-10-CM

## 2016-12-04 DIAGNOSIS — E785 Hyperlipidemia, unspecified: Secondary | ICD-10-CM | POA: Diagnosis not present

## 2016-12-04 LAB — COMPREHENSIVE METABOLIC PANEL
ALT: 30 U/L (ref 0–53)
AST: 25 U/L (ref 0–37)
Albumin: 3.9 g/dL (ref 3.5–5.2)
Alkaline Phosphatase: 49 U/L (ref 39–117)
BUN: 24 mg/dL — ABNORMAL HIGH (ref 6–23)
CO2: 24 mEq/L (ref 19–32)
Calcium: 9.3 mg/dL (ref 8.4–10.5)
Chloride: 108 mEq/L (ref 96–112)
Creatinine, Ser: 1.35 mg/dL (ref 0.40–1.50)
GFR: 69.59 mL/min (ref >60.00)
Glucose, Bld: 224 mg/dL — ABNORMAL HIGH (ref 70–99)
Potassium: 4.4 mEq/L (ref 3.5–5.1)
Sodium: 139 mEq/L (ref 135–145)
Total Bilirubin: 0.3 mg/dL (ref 0.2–1.2)
Total Protein: 7.2 g/dL (ref 6.0–8.3)

## 2016-12-04 LAB — LIPID PANEL
Cholesterol: 117 mg/dL (ref 0–200)
HDL: 23 mg/dL — ABNORMAL LOW (ref >39.00)
LDL Cholesterol: 57 mg/dL (ref 0–99)
NonHDL: 93.66
Total CHOL/HDL Ratio: 5
Triglycerides: 185 mg/dL — ABNORMAL HIGH (ref 0.0–149.0)
VLDL: 37 mg/dL (ref 0.0–40.0)

## 2016-12-04 LAB — POCT GLYCOSYLATED HEMOGLOBIN (HGB A1C): Hemoglobin A1C: 8.5

## 2016-12-04 MED ORDER — INSULIN NPH (HUMAN) (ISOPHANE) 100 UNIT/ML ~~LOC~~ SUSP
SUBCUTANEOUS | 11 refills | Status: DC
Start: 2016-12-04 — End: 2017-06-08

## 2016-12-04 NOTE — Progress Notes (Signed)
85

## 2016-12-04 NOTE — Patient Instructions (Addendum)
Please increase the insulin to 130 units each morning and 90 units each evening.  I have sent a new prescription to your pharmacy.   check your blood sugar twice a day.  vary the time of day when you check, between before the 3 meals, and at bedtime.  also check if you have symptoms of your blood sugar being too high or too low.  please keep a record of the readings and bring it to your next appointment here.  You can write it on any piece of paper.  please call us sooner if your blood sugar goes below 70, or if you have a lot of readings over 200.   On this type of insulin schedule, you should eat meals on a regular schedule (especially lunch).  If a meal is missed or significantly delayed, your blood sugar could go low.   Please come back for a follow-up appointment in 2 months.

## 2016-12-04 NOTE — Progress Notes (Signed)
Subjective:    Patient ID: Alex Rocha, male    DOB: 06-09-1955, 61 y.o.   MRN: [id number]  HPI Pt returns for f/u of diabetes mellitus: DM type: Insulin-requiring type 2. Dx'ed: 2000.  Complications: polyneuropathy.   Therapy: insulin since 2014.  DKA: never.  Severe hypoglycemia: never.   Pancreatitis: never.   Other: he chose a simple and inexpensive insulin regimen; he says his need to care for his wife, who has cancer, limits his ability to care for himself; he declines weight-loss surgery.  Interval history:  Pt says he misses the insulin approx twice per month.  no cbg record, but states cbg's are slight higher fasting than later in the day.  He takes 130 units qam and 80 units qpm.  He seldom has hypoglycemia, and these episodes are mild. 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  He does not notice the goiter.   Past Medical History:  Diagnosis Date  . Colon polyps   . Diabetes mellitus   . Environmental allergies   . Hyperlipidemia   . Hypertension   . Hyperthyroidism   . Kidney stones   . Low testosterone     No past surgical history on file.  Social History   Social History  . Marital status: Married    Spouse name: N/A  . Number of children: 2  . Years of education: N/A   Occupational History  . probation/parole officer    Social History Main Topics  . Smoking status: Never Smoker  . Smokeless tobacco: Never Used  . Alcohol use 0.0 oz/week     Comment: social  . Drug use: No  . Sexual activity: Not on file   Other Topics Concern  . Not on file   Social History Narrative  . No narrative on file    Current Outpatient Prescriptions on File Prior to Visit  Medication Sig Dispense Refill  . atorvastatin (LIPITOR) 20 MG tablet TAKE 1 TABLET BY MOUTH EVERY DAY (APPT DUE) 30 tablet 4  . diclofenac sodium (VOLTAREN) 1 % GEL Apply 4 g topically 4 (four) times daily. 100 g  1  . fluticasone (FLONASE) 50 MCG/ACT nasal spray Place 2 sprays into both nostrils daily. 48 g 1  . Insulin Syringe-Needle U-100 (B-D INS SYR ULTRAFINE 1CC/31G) 31G X 5/16" 1 ML MISC 1 strip by Other route 2 (two) times daily. And lancets 2/day 200 each 3  . levocetirizine (XYZAL) 5 MG tablet Take 1 tablet (5 mg total) by mouth every evening. 90 tablet 1  . levothyroxine (SYNTHROID, LEVOTHROID) 100 MCG tablet TAKE 1 TABLET BY MOUTH ONCE DAILY BEFORE BREAKFAST 90 tablet 2  . losartan (COZAAR) 100 MG tablet Take 1 tablet (100 mg total) by mouth daily. (Patient not taking: Reported on 08/14/2016) 90 tablet 1  . losartan (COZAAR) 100 MG tablet TAKE 1 TABLET (100 MG TOTAL) BY MOUTH DAILY. 30 tablet 11  . omeprazole (PRILOSEC) 40 MG capsule TAKE ONE CAPSULE BY MOUTH EVERY DAY 30 capsule 5  . triamcinolone cream (KENALOG) 0.1 % Apply 1 application topically 2 (two) times daily. 30 g 0   No current facility-administered medications on file prior to visit.     Allergies  Allergen Reactions  . [name] [Yohimbine] Palpitations    Family History  Problem Relation Age of Onset  . Colon cancer Mother   . Heart disease Mother   . Hypertension Mother  Mother's side of the family  . Diabetes Mother        Mother's side of the family  . Colon cancer Brother   . Hyperlipidemia Brother        Mother's side of the family  . Heart disease Sister   . Diabetes Unknown        father's family    BP 122/70   Pulse 77   Wt (!) 301 lb (136.5 kg)   SpO2 97%   BMI 38.65 kg/m    Review of Systems Denies LOC    Objective:   Physical Exam VITAL SIGNS:  See vs page GENERAL: no distress Pulses: foot pulses are intact bilaterally.   MSK: no deformity of the feet or ankles.  CV: 1+ bilat leg edema of the legs. Skin:  no ulcer on the feet or ankles.  normal color and temp on the feet and ankles Neuro: sensation is intact to touch on the feet and ankles, but decreased from normal Ext: There is  bilateral onychomycosis of the toenails   Lab Results  Component Value Date   HGBA1C 8.5 12/04/2016      Assessment & Plan:  Insulin-requiring type 2 DM, with: he needs increased rx   Patient Instructions  Please increase the insulin to 130 units each morning and 90 units each evening.  I have sent a new prescription to your pharmacy.   check your blood sugar twice a day.  vary the time of day when you check, between before the 3 meals, and at bedtime.  also check if you have symptoms of your blood sugar being too high or too low.  please keep a record of the readings and bring it to your next appointment here.  You can write it on any piece of paper.  please call us sooner if your blood sugar goes below 70, or if you have a lot of readings over 200.   On this type of insulin schedule, you should eat meals on a regular schedule (especially lunch).  If a meal is missed or significantly delayed, your blood sugar could go low.   Please come back for a follow-up appointment in 2 months.

## 2016-12-05 ENCOUNTER — Other Ambulatory Visit: Payer: Self-pay | Admitting: Family Medicine

## 2016-12-05 NOTE — Telephone Encounter (Signed)
Sent rx to pharmacy. LB

## 2016-12-06 ENCOUNTER — Other Ambulatory Visit: Payer: Self-pay | Admitting: Family Medicine

## 2016-12-06 DIAGNOSIS — E785 Hyperlipidemia, unspecified: Secondary | ICD-10-CM

## 2016-12-20 ENCOUNTER — Telehealth: Payer: Self-pay | Admitting: Family Medicine

## 2016-12-20 NOTE — Telephone Encounter (Signed)
Self.    Refill request for diclofenac    Pharmacy: [id number] - [place], [place]

## 2016-12-21 MED ORDER — DICLOFENAC SODIUM 1 % TD GEL: 4.0000 g | Freq: Four times a day (QID) | TRANSDERMAL | 1 refills | Status: DC

## 2016-12-21 NOTE — Telephone Encounter (Signed)
Diclofenac sodium (Voltaren) 1% Gel Apply 4 g topically 4 (four) times daily Last filled: 06/12/16 Last OV:  05/29/16 Med filled.

## 2017-01-21 ENCOUNTER — Other Ambulatory Visit: Payer: Self-pay | Admitting: Family Medicine

## 2017-01-29 ENCOUNTER — Other Ambulatory Visit: Payer: Self-pay | Admitting: Family Medicine

## 2017-02-02 ENCOUNTER — Encounter: Payer: Self-pay | Admitting: Endocrinology

## 2017-02-02 ENCOUNTER — Ambulatory Visit (INDEPENDENT_AMBULATORY_CARE_PROVIDER_SITE_OTHER): Payer: BC Managed Care – PPO | Admitting: Endocrinology

## 2017-02-02 VITALS — BP 130/78 | HR 87 | Wt 306.0 lb

## 2017-02-02 DIAGNOSIS — Z794 Long term (current) use of insulin: Secondary | ICD-10-CM

## 2017-02-02 DIAGNOSIS — E119 Type 2 diabetes mellitus without complications: Secondary | ICD-10-CM | POA: Diagnosis not present

## 2017-02-02 LAB — POCT GLYCOSYLATED HEMOGLOBIN (HGB A1C): Hemoglobin A1C: 7.5

## 2017-02-02 NOTE — Patient Instructions (Addendum)
Please continue the same insulin. To prevent low blood sugar, you should eat earlier in the day, take the morning insulin later in the morning, or both. check your blood sugar twice a day.  vary the time of day when you check, between before the 3 meals, and at bedtime.  also check if you have symptoms of your blood sugar being too high or too low.  please keep a record of the readings and bring it to your next appointment here.  You can write it on any piece of paper.  please call us sooner if your blood sugar goes below 70, or if you have a lot of readings over 200.   On this type of insulin schedule, you should eat meals on a regular schedule (especially lunch).  If a meal is missed or significantly delayed, your blood sugar could go low.   Please come back for a follow-up appointment in 3 months.

## 2017-02-02 NOTE — Progress Notes (Signed)
Subjective:    Patient ID: Alex Rocha, male    DOB: 09-Aug-1955, 61 y.o.   MRN: [id number]  HPI Pt returns for f/u of diabetes mellitus: DM type: Insulin-requiring type 2. Dx'ed: 2000.  Complications: polyneuropathy and renal insuff Therapy: insulin since 2014.  DKA: never.  Severe hypoglycemia: once, in 2018 Pancreatitis: never.   Other: he declines multiple daily injections; ; he says his need to care for his wife, who has cancer, limits his ability to care for himself; he declines weight-loss surgery.  Interval history:  no cbg record, but states cbg's are mildly low at lunch approx once per week.  He says cbg's are similar on work days vs days off.  A few mos ago, he had an episode of severe hypoglycemia.  This happened when lunch was delayed. He takes am insulin at 7 am.  First meal of the day might not be until the afternoon.  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    Past Medical History:  Diagnosis Date  . Colon polyps   . Diabetes mellitus   . Environmental allergies   . Hyperlipidemia   . Hypertension   . Hyperthyroidism   . Kidney stones   . Low testosterone     No past surgical history on file.  Social History   Social History  . Marital status: Married    Spouse name: N/A  . Number of children: 2  . Years of education: N/A   Occupational History  . probation/parole officer    Social History Main Topics  . Smoking status: Never Smoker  . Smokeless tobacco: Never Used  . Alcohol use 0.0 oz/week     Comment: social  . Drug use: No  . Sexual activity: Not on file   Other Topics Concern  . Not on file   Social History Narrative  . No narrative on file    Current Outpatient Prescriptions on File Prior to Visit  Medication Sig Dispense Refill  . atorvastatin (LIPITOR) 20 MG tablet TAKE 1 TABLET BY MOUTH EVERY DAY (APPT DUE) 30  tablet 4  . diclofenac sodium (VOLTAREN) 1 % GEL Apply 4 g topically 4 (four) times daily. 100 g 1  . fenofibrate 160 MG tablet TAKE 1 TABLET BY MOUTH DAILY **LABS DUE** 30 tablet 4  . fluticasone (FLONASE) 50 MCG/ACT nasal spray Place 2 sprays into both nostrils daily. 48 g 1  . insulin NPH Human (NOVOLIN N) 100 UNIT/ML injection 130 units each morning and 90 units each evening, and syringes 2/day 80 mL 11  . Insulin Syringe-Needle U-100 (B-D INS SYR ULTRAFINE 1CC/31G) 31G X 5/16" 1 ML MISC 1 strip by Other route 2 (two) times daily. And lancets 2/day 200 each 3  . levocetirizine (XYZAL) 5 MG tablet Take 1 tablet (5 mg total) by mouth every evening. 90 tablet 1  . levothyroxine (SYNTHROID, LEVOTHROID) 100 MCG tablet TAKE 1 TABLET BY MOUTH ONCE DAILY BEFORE BREAKFAST 30 tablet 0  . losartan (COZAAR) 100 MG tablet Take 1 tablet (100 mg total) by mouth daily. (Patient not taking: Reported on 08/14/2016) 90 tablet 1  . omeprazole (PRILOSEC) 40 MG capsule TAKE ONE CAPSULE BY MOUTH EVERY DAY 30 capsule 5  . triamcinolone cream (KENALOG) 0.1 % Apply 1 application topically 2 (two) times daily. 30 g 0   No current facility-administered medications on file  prior to visit.     Allergies  Allergen Reactions  . [name] [Yohimbine] Palpitations    Family History  Problem Relation Age of Onset  . Colon cancer Mother   . Heart disease Mother   . Hypertension Mother        Mother's side of the family  . Diabetes Mother        Mother's side of the family  . Colon cancer Brother   . Hyperlipidemia Brother        Mother's side of the family  . Heart disease Sister   . Diabetes Unknown        father's family    BP 130/78   Pulse 87   Wt (!) 306 lb (138.8 kg)   SpO2 97%   BMI 39.29 kg/m    Review of Systems Denies LOC.      Objective:   Physical Exam VITAL SIGNS:  See vs page GENERAL: no distress Pulses: foot pulses are intact bilaterally.   MSK: no deformity of the feet or ankles.    CV: 1+ bilat leg edema of the legs. Skin:  no ulcer on the feet or ankles.  normal color and temp on the feet and ankles Neuro: sensation is intact to touch on the feet and ankles, but decreased from normal Ext: There is bilateral onychomycosis of the toenails.  Lab Results  Component Value Date   HGBA1C 7.5 02/02/2017      Assessment & Plan:  Insulin-requiring type 2 DM, with renal insuff: this is the best control this pt should aim for, given this regimen, which does match insulin to her changing needs throughout the day Hypoglycemia: worse.    Patient Instructions  Please continue the same insulin. To prevent low blood sugar, you should eat earlier in the day, take the morning insulin later in the morning, or both. check your blood sugar twice a day.  vary the time of day when you check, between before the 3 meals, and at bedtime.  also check if you have symptoms of your blood sugar being too high or too low.  please keep a record of the readings and bring it to your next appointment here.  You can write it on any piece of paper.  please call us sooner if your blood sugar goes below 70, or if you have a lot of readings over 200.   On this type of insulin schedule, you should eat meals on a regular schedule (especially lunch).  If a meal is missed or significantly delayed, your blood sugar could go low.   Please come back for a follow-up appointment in 3 months.

## 2017-02-23 ENCOUNTER — Other Ambulatory Visit: Payer: Self-pay | Admitting: Family Medicine

## 2017-03-01 ENCOUNTER — Encounter: Payer: Self-pay | Admitting: Internal Medicine

## 2017-03-02 ENCOUNTER — Encounter: Payer: Self-pay | Admitting: Internal Medicine

## 2017-03-02 ENCOUNTER — Ambulatory Visit: Payer: BC Managed Care – PPO | Admitting: Internal Medicine

## 2017-03-02 VITALS — BP 138/82 | HR 83 | Resp 16 | Ht 74.0 in | Wt 301.0 lb

## 2017-03-02 DIAGNOSIS — G4733 Obstructive sleep apnea (adult) (pediatric): Secondary | ICD-10-CM | POA: Diagnosis not present

## 2017-03-02 NOTE — Patient Instructions (Signed)
--  Continue to try using your CPAP every night. Goal of CPAP is to put it on when you go to bed and take it off when you wake up.

## 2017-03-02 NOTE — Progress Notes (Signed)
[facility] Consultation      Assessment and Plan:  Severe Obstructive Sleep Apnea.  --Home sleep study 12/28/15 >> AHI 86.5, SaO2 low 66. -Patient is is feeling much better, he is no longer snoring, he is adequately compliant with CPAP and benefiting from its use.  -Continue on auto-CPAP 10-25.  Discussed the importance of wearing CPAP the entire night.  Diabetes mellitus.  -Sleep apnea can worsen blood sugar control.  Hypothyroidism.  - continue thyroid hormone replacement.  Essential hypertension. -Sleep apnea can contribute to elevated blood pressure, therefore should remain on CPAP.   Obesity. -This can contribute to sleep apnea, recommended weight loss.  Return in about 1 year (around 03/02/2018).   Date: 03/02/2017  MRN# [id number] Alex Rocha December 25, 1955  Referring Physician:   [name] is a 61 y.o. old male seen in consultation for chief complaint of:    Chief Complaint  Patient presents with  . Sleep Apnea    Pt here for cpap compliance visit, he wears nightly about 7 hrs and has no complaints.    HPI:   Patient is a 61 yo male who presents with severe OSA with AHI of 86.5 at last visit pressure was changed to 10-25 cmH2O. He feels that he has been doing well with the cpap, he still has trouble with the straps and does not feel used to them, he is on a full face mask.  He is more awake during the day and is no longer snoring.  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   Review of download data 30 days as of 03/01/17, uses greater than 4 hours is 20 days.  Average usage on days used was 4 hours 52 minutes.  Pressure is auto 10-20.  Median pressure is 15.7, 95th percentile is 19.    Medication:    Current Outpatient Medications:  .  atorvastatin (LIPITOR) 20 MG tablet, TAKE 1 TABLET BY MOUTH EVERY DAY (APPT DUE), Disp: 30 tablet, Rfl: 4 .  diclofenac sodium (VOLTAREN) 1 % GEL, Apply 4 g topically 4  (four) times daily., Disp: 100 g, Rfl: 1 .  fenofibrate 160 MG tablet, TAKE 1 TABLET BY MOUTH DAILY **LABS DUE**, Disp: 30 tablet, Rfl: 4 .  insulin NPH Human (NOVOLIN N) 100 UNIT/ML injection, 130 units each morning and 90 units each evening, and syringes 2/day, Disp: 80 mL, Rfl: 11 .  Insulin Syringe-Needle U-100 (B-D INS SYR ULTRAFINE 1CC/31G) 31G X 5/16" 1 ML MISC, 1 strip by Other route 2 (two) times daily. And lancets 2/day, Disp: 200 each, Rfl: 3 .  levocetirizine (XYZAL) 5 MG tablet, Take 1 tablet (5 mg total) by mouth every evening., Disp: 90 tablet, Rfl: 1 .  levothyroxine (SYNTHROID, LEVOTHROID) 100 MCG tablet, TAKE 1 TABLET BY MOUTH ONCE DAILY BEFORE BREAKFAST, Disp: 30 tablet, Rfl: 0 .  losartan (COZAAR) 100 MG tablet, Take 1 tablet (100 mg total) by mouth daily., Disp: 90 tablet, Rfl: 1 .  triamcinolone cream (KENALOG) 0.1 %, Apply 1 application topically 2 (two) times daily., Disp: 30 g, Rfl: 0 .  fluticasone (FLONASE) 50 MCG/ACT nasal spray, Place 2 sprays into both nostrils daily. (Patient not taking: Reported on 03/02/2017), Disp: 48 g, Rfl: 1 .  omeprazole (PRILOSEC) 40 MG capsule, TAKE ONE CAPSULE BY MOUTH EVERY DAY (Patient not taking: Reported on 03/02/2017), Disp: 30 capsule, Rfl: 5   Allergies:  [name] [yohimbine]  Review of Systems: Gen:  Denies  fever, sweats, chills HEENT: Denies blurred vision, double vision. Bleeds. Cvc:  No dizziness, chest pain. Resp:   Denies cough or sputum production. Skin: No skin rash,  hives  Endoc:  No polyuria, polydipsia. Psych: No depression, insomnia. Other:  All other systems were reviewed with the patient and were negative other that what is mentioned in the HPI.   Physical Examination:   VS: BP 138/82 (BP Location: Left Arm, Cuff Size: Large)   Pulse 83   Resp 16   Ht 6\' 2"  (1.88 m)   Wt (!) 301 lb (136.5 kg)   SpO2 98%   BMI 38.65 kg/m   General Appearance: No distress  Neuro:without focal findings,  speech normal,    HEENT: PERRLA, EOM intact.   Pulmonary: normal breath sounds, No wheezing.  CardiovascularNormal S1,S2.  No m/r/g.   Abdomen: Benign, Soft, non-tender. Renal:  No costovertebral tenderness  GU:  No performed at this time. Endoc: No evident thyromegaly, no signs of acromegaly. Skin:   warm, no rashes, no ecchymosis  Extremities: normal, no cyanosis, clubbing.  Other findings:    LABORATORY PANEL:   CBC No results for input(s): WBC, HGB, HCT, PLT in the last 168 hours. ------------------------------------------------------------------------------------------------------------------  Chemistries  No results for input(s): NA, K, CL, CO2, GLUCOSE, BUN, CREATININE, CALCIUM, MG, AST, ALT, ALKPHOS, BILITOT in the last 168 hours.  Invalid input(s): GFRCGP ------------------------------------------------------------------------------------------------------------------  Cardiac Enzymes No results for input(s): TROPONINI in the last 168 hours. ------------------------------------------------------------  RADIOLOGY:  No results found.     Thank  you for the consultation and for allowing [facility] to assist in the care of your patient. Our recommendations are noted above.  Please contact us if we can be of further service.   Marda Stalker, MD.  Board Certified in Internal Medicine, Pulmonary Medicine, [place], and Sleep Medicine.  [facility] Office Number: [phone]  Patricia Pesa, M.D.  Merton Border, M.D  03/02/2017

## 2017-03-27 ENCOUNTER — Other Ambulatory Visit: Payer: Self-pay | Admitting: Family Medicine

## 2017-05-01 ENCOUNTER — Other Ambulatory Visit: Payer: Self-pay | Admitting: Family Medicine

## 2017-05-04 ENCOUNTER — Ambulatory Visit: Payer: BC Managed Care – PPO | Admitting: Endocrinology

## 2017-05-15 ENCOUNTER — Other Ambulatory Visit: Payer: Self-pay | Admitting: Family Medicine

## 2017-05-22 ENCOUNTER — Ambulatory Visit: Payer: BC Managed Care – PPO | Admitting: Family Medicine

## 2017-05-22 ENCOUNTER — Encounter: Payer: Self-pay | Admitting: Family Medicine

## 2017-05-22 VITALS — BP 138/70 | HR 82 | Temp 98.8°F | Resp 16 | Ht 74.0 in | Wt 310.6 lb

## 2017-05-22 DIAGNOSIS — R8290 Unspecified abnormal findings in urine: Secondary | ICD-10-CM

## 2017-05-22 DIAGNOSIS — E1165 Type 2 diabetes mellitus with hyperglycemia: Secondary | ICD-10-CM

## 2017-05-22 DIAGNOSIS — IMO0002 Reserved for concepts with insufficient information to code with codable children: Secondary | ICD-10-CM

## 2017-05-22 DIAGNOSIS — R82998 Other abnormal findings in urine: Secondary | ICD-10-CM

## 2017-05-22 DIAGNOSIS — M5442 Lumbago with sciatica, left side: Secondary | ICD-10-CM

## 2017-05-22 DIAGNOSIS — E785 Hyperlipidemia, unspecified: Secondary | ICD-10-CM | POA: Diagnosis not present

## 2017-05-22 DIAGNOSIS — E1151 Type 2 diabetes mellitus with diabetic peripheral angiopathy without gangrene: Secondary | ICD-10-CM

## 2017-05-22 LAB — POC URINALSYSI DIPSTICK (AUTOMATED)
Bilirubin, UA: NEGATIVE
Blood, UA: NEGATIVE
Glucose, UA: NEGATIVE
Ketones, UA: NEGATIVE
Nitrite, UA: NEGATIVE
Protein, UA: NEGATIVE
Spec Grav, UA: 1.025 (ref 1.010–1.025)
Urobilinogen, UA: 0.2 E.U./dL
pH, UA: 6 (ref 5.0–8.0)

## 2017-05-22 MED ORDER — CYCLOBENZAPRINE HCL 10 MG PO TABS: 10.0000 mg | ORAL_TABLET | Freq: Three times a day (TID) | ORAL | 0 refills | Status: DC | PRN

## 2017-05-22 MED ORDER — MELOXICAM 7.5 MG PO TABS: ORAL_TABLET | ORAL | 1 refills | Status: DC

## 2017-05-22 NOTE — Assessment & Plan Note (Signed)
Tolerating statin, encouraged heart healthy diet, avoid trans fats, minimize simple carbs and saturated fats. Increase exercise as tolerated 

## 2017-05-22 NOTE — Progress Notes (Signed)
Patient ID: Alex Rocha, male   DOB: 18-Aug-1955, 62 y.o.   MRN: [id number]    Subjective:  I acted as a Education administrator for Dr. Carollee Herter.  Guerry [place]in, [place]   Patient ID: Alex Rocha, male    DOB: [birth year], 62 y.o.   MRN: [id number]  Chief Complaint  Patient presents with  . Back Pain    Back Pain  This is a new problem. Episode onset: november. The pain is present in the lumbar spine (left hip). The pain does not radiate. Associated symptoms include numbness (left leg), tingling and weakness. Pertinent negatives include no abdominal pain, bladder incontinence, bowel incontinence, chest pain, dysuria, fever or headaches. He has tried nothing for the symptoms.    Patient is in today for back pain.   Patient Care Team: Carollee Herter, Alferd Apa, DO as PCP - General (Family Medicine)   Past Medical History:  Diagnosis Date  . Colon polyps   . Diabetes mellitus   . Environmental allergies   . Hyperlipidemia   . Hypertension   . Hyperthyroidism   . Kidney stones   . Low testosterone     No past surgical history on file.  Family History  Problem Relation Age of Onset  . Colon cancer Mother   . Heart disease Mother   . Hypertension Mother        Mother's side of the family  . Diabetes Mother        Mother's side of the family  . Colon cancer Brother   . Hyperlipidemia Brother        Mother's side of the family  . Heart disease Sister   . Diabetes Unknown        father's family    Social History   Socioeconomic History  . Marital status: Married    Spouse name: Not on file  . Number of children: 2  . Years of education: Not on file  . Highest education level: Not on file  Social Needs  . Financial resource strain: Not on file  . Food insecurity - worry: Not on file  . Food insecurity - inability: Not on file  . Transportation needs - medical: Not on file  . Transportation needs - non-medical: Not on file  Occupational History  . Occupation: Nurse, adult    Tobacco Use  . Smoking status: Never Smoker  . Smokeless tobacco: Never Used  Substance and Sexual Activity  . Alcohol use: Yes    Alcohol/week: 0.0 oz    Comment: social  . Drug use: No  . Sexual activity: Not on file  Other Topics Concern  . Not on file  Social History Narrative  . Not on file    Outpatient Medications Prior to Visit  Medication Sig Dispense Refill  . atorvastatin (LIPITOR) 20 MG tablet TAKE 1 TABLET BY MOUTH EVERY DAY (APPT DUE) 30 tablet 4  . diclofenac sodium (VOLTAREN) 1 % GEL Apply 4 g topically 4 (four) times daily. 100 g 1  . fenofibrate 160 MG tablet TAKE 1 TABLET BY MOUTH DAILY 30 tablet 4  . fluticasone (FLONASE) 50 MCG/ACT nasal spray Place 2 sprays into both nostrils daily. 48 g 1  . insulin NPH Human (NOVOLIN N) 100 UNIT/ML injection 130 units each morning and 90 units each evening, and syringes 2/day 80 mL 11  . Insulin Syringe-Needle U-100 (B-D INS SYR ULTRAFINE 1CC/31G) 31G X 5/16" 1 ML MISC 1 strip by Other route 2 (two) times daily. And lancets 2/day 200  each 3  . levothyroxine (SYNTHROID, LEVOTHROID) 100 MCG tablet TAKE 1 TABLET BY MOUTH ONCE DAILY BEFORE BREAKFAST 30 tablet 0  . losartan (COZAAR) 100 MG tablet Take 1 tablet (100 mg total) by mouth daily. 90 tablet 1  . triamcinolone cream (KENALOG) 0.1 % Apply 1 application topically 2 (two) times daily. 30 g 0  . levocetirizine (XYZAL) 5 MG tablet Take 1 tablet (5 mg total) by mouth every evening. 90 tablet 1  . omeprazole (PRILOSEC) 40 MG capsule TAKE ONE CAPSULE BY MOUTH EVERY DAY 30 capsule 5   No facility-administered medications prior to visit.     Allergies  Allergen Reactions  . [name] [Yohimbine] Palpitations    Review of Systems  Constitutional: Negative for chills, fever and malaise/fatigue.  HENT: Negative for congestion and hearing loss.   Eyes: Negative for discharge.  Respiratory: Negative for cough, sputum production and shortness of breath.   Cardiovascular: Negative  for chest pain, palpitations and leg swelling.  Gastrointestinal: Negative for abdominal pain, blood in stool, bowel incontinence, constipation, diarrhea, heartburn, nausea and vomiting.  Genitourinary: Negative for bladder incontinence, dysuria, frequency, hematuria and urgency.  Musculoskeletal: Positive for back pain. Negative for falls and myalgias.  Skin: Negative for rash.  Neurological: Positive for tingling, weakness and numbness (left leg). Negative for dizziness, sensory change, loss of consciousness and headaches.  Endo/Heme/Allergies: Negative for environmental allergies. Does not bruise/bleed easily.  Psychiatric/Behavioral: Negative for depression and suicidal ideas. The patient is not nervous/anxious and does not have insomnia.        Objective:    Physical Exam  Constitutional: He is oriented to person, place, and time. Vital signs are normal. He appears well-developed and well-nourished. He is sleeping.  HENT:  Head: Normocephalic and atraumatic.  Mouth/Throat: Oropharynx is clear and moist.  Eyes: EOM are normal. Pupils are equal, round, and reactive to light.  Neck: Normal range of motion. Neck supple. No thyromegaly present.  Cardiovascular: Normal rate and regular rhythm.  No murmur heard. Pulmonary/Chest: Effort normal and breath sounds normal. No respiratory distress. He has no wheezes. He has no rales. He exhibits no tenderness.  Musculoskeletal: He exhibits no edema or tenderness.  Neurological: He is alert and oriented to person, place, and time. He displays normal reflexes. No cranial nerve deficit. He exhibits normal muscle tone. Coordination normal.  Skin: Skin is warm and dry.  Psychiatric: He has a normal mood and affect. His behavior is normal. Judgment and thought content normal.  Nursing note and vitals reviewed.   BP 138/70   Pulse 82   Temp 98.8 F (37.1 C) (Oral)   Resp 16   Ht 6\' 2"  (1.88 m)   Wt (!) 310 lb 9.6 oz (140.9 kg)   SpO2 97%   BMI  39.88 kg/m  Wt Readings from Last 3 Encounters:  05/22/17 (!) 310 lb 9.6 oz (140.9 kg)  03/02/17 (!) 301 lb (136.5 kg)  02/02/17 (!) 306 lb (138.8 kg)   BP Readings from Last 3 Encounters:  05/22/17 138/70  03/02/17 138/82  02/02/17 130/78     Immunization History  Administered Date(s) Administered  . Influenza,inj,Quad PF,6+ Mos 04/28/2013, 01/26/2014, 12/11/2014, 02/04/2016  . Pneumococcal Polysaccharide-23 01/11/2015  . Tdap 01/11/2015    Health Maintenance  Topic Date Due  . HIV Screening  01/11/1973  . INFLUENZA VACCINE  11/15/2016  . OPHTHALMOLOGY EXAM  03/17/2017  . HEMOGLOBIN A1C  08/03/2017  . FOOT EXAM  02/02/2018  . PNEUMOCOCCAL POLYSACCHARIDE VACCINE (2) 01/11/2020  .  COLONOSCOPY  11/25/2020  . TETANUS/TDAP  01/10/2025  . Hepatitis C Screening  Completed    Lab Results  Component Value Date   WBC 6.7 12/29/2014   HGB 13.8 12/29/2014   HCT 41.3 12/29/2014   PLT 206 12/29/2014   GLUCOSE 224 (H) 12/04/2016   CHOL 117 12/04/2016   TRIG 185.0 (H) 12/04/2016   HDL 23.00 (L) 12/04/2016   LDLDIRECT 78.0 11/18/2015   LDLCALC 57 12/04/2016   ALT 30 12/04/2016   AST 25 12/04/2016   NA 139 12/04/2016   K 4.4 12/04/2016   CL 108 12/04/2016   CREATININE 1.35 12/04/2016   BUN 24 (H) 12/04/2016   CO2 24 12/04/2016   TSH 2.08 08/14/2016   HGBA1C 7.5 02/02/2017   MICROALBUR 0.3 06/18/2015    Lab Results  Component Value Date   TSH 2.08 08/14/2016   Lab Results  Component Value Date   WBC 6.7 12/29/2014   HGB 13.8 12/29/2014   HCT 41.3 12/29/2014   MCV 92.0 12/29/2014   PLT 206 12/29/2014   Lab Results  Component Value Date   NA 139 12/04/2016   K 4.4 12/04/2016   CO2 24 12/04/2016   GLUCOSE 224 (H) 12/04/2016   BUN 24 (H) 12/04/2016   CREATININE 1.35 12/04/2016   BILITOT 0.3 12/04/2016   ALKPHOS 49 12/04/2016   AST 25 12/04/2016   ALT 30 12/04/2016   PROT 7.2 12/04/2016   ALBUMIN 3.9 12/04/2016   CALCIUM 9.3 12/04/2016   ANIONGAP 8  12/29/2014   GFR 69.59 12/04/2016   Lab Results  Component Value Date   CHOL 117 12/04/2016   Lab Results  Component Value Date   HDL 23.00 (L) 12/04/2016   Lab Results  Component Value Date   LDLCALC 57 12/04/2016   Lab Results  Component Value Date   TRIG 185.0 (H) 12/04/2016   Lab Results  Component Value Date   CHOLHDL 5 12/04/2016   Lab Results  Component Value Date   HGBA1C 7.5 02/02/2017         Assessment & Plan:   Problem List Items Addressed This Visit      Unprioritized   Acute left-sided low back pain with left-sided sciatica - Primary    pred taper  mobic Ice/ heat  If no better or worsens over next 2 weeks rto       Relevant Medications   cyclobenzaprine (FLEXERIL) 10 MG tablet   meloxicam (MOBIC) 7.5 MG tablet   DM (diabetes mellitus) type II uncontrolled, periph vascular disorder ([place])    hgba1c to be checked, minimize simple carbs. Increase exercise as tolerated. Continue current meds       Relevant Orders   Lipid panel   Hemoglobin A1c   Comprehensive metabolic panel   Microalbumin / creatinine urine ratio   POCT Urinalysis Dipstick (Automated) (Completed)   Hyperlipidemia LDL goal <70    Tolerating statin, encouraged heart healthy diet, avoid trans fats, minimize simple carbs and saturated fats. Increase exercise as tolerated      Relevant Orders   Lipid panel   Hemoglobin A1c   Comprehensive metabolic panel   Microalbumin / creatinine urine ratio   POCT Urinalysis Dipstick (Automated) (Completed)    Other Visit Diagnoses    Urine abnormality       Relevant Orders   Urine Culture   Urine leukocytes       Relevant Orders   Urine Culture      I have discontinued Annie Main Corporan's  omeprazole and levocetirizine. I am also having him start on cyclobenzaprine and meloxicam. Additionally, I am having him maintain his triamcinolone cream, losartan, fluticasone, Insulin Syringe-Needle U-100, insulin NPH Human, diclofenac sodium,  atorvastatin, levothyroxine, and fenofibrate.  Meds ordered this encounter  Medications  . cyclobenzaprine (FLEXERIL) 10 MG tablet    Sig: Take 1 tablet (10 mg total) by mouth 3 (three) times daily as needed for muscle spasms.    Dispense:  30 tablet    Refill:  0  . meloxicam (MOBIC) 7.5 MG tablet    Sig: 1-2 po qd prn    Dispense:  60 tablet    Refill:  1   CMA served as scribe during this visit. History, Physical and Plan performed by medical provider. Documentation and orders reviewed and attested to.  Ann Held, DO

## 2017-05-22 NOTE — Assessment & Plan Note (Signed)
hgba1c to be checked, minimize simple carbs. Increase exercise as tolerated. Continue current meds  

## 2017-05-22 NOTE — Assessment & Plan Note (Signed)
pred taper  mobic Ice/ heat  If no better or worsens over next 2 weeks rto

## 2017-05-22 NOTE — Patient Instructions (Signed)

## 2017-05-23 LAB — COMPREHENSIVE METABOLIC PANEL
ALT: 31 U/L (ref 0–53)
AST: 31 U/L (ref 0–37)
Albumin: 4.3 g/dL (ref 3.5–5.2)
Alkaline Phosphatase: 35 U/L — ABNORMAL LOW (ref 39–117)
BUN: 15 mg/dL (ref 6–23)
CO2: 29 mEq/L (ref 19–32)
Calcium: 9.4 mg/dL (ref 8.4–10.5)
Chloride: 109 mEq/L (ref 96–112)
Creatinine, Ser: 1.32 mg/dL (ref 0.40–1.50)
GFR: 71.31 mL/min (ref >60.00)
Glucose, Bld: 74 mg/dL (ref 70–99)
Potassium: 4.1 mEq/L (ref 3.5–5.1)
Sodium: 143 mEq/L (ref 135–145)
Total Bilirubin: 0.4 mg/dL (ref 0.2–1.2)
Total Protein: 7.1 g/dL (ref 6.0–8.3)

## 2017-05-23 LAB — URINE CULTURE
MICRO NUMBER:: 90153993
SPECIMEN QUALITY:: ADEQUATE

## 2017-05-23 LAB — MICROALBUMIN / CREATININE URINE RATIO
Creatinine,U: 231.8 mg/dL
Microalb Creat Ratio: 0.6 mg/g (ref 0.0–30.0)
Microalb, Ur: 1.4 mg/dL (ref 0.0–1.9)

## 2017-05-23 LAB — LIPID PANEL
Cholesterol: 116 mg/dL (ref 0–200)
HDL: 24.3 mg/dL — ABNORMAL LOW (ref >39.00)
LDL Cholesterol: 68 mg/dL (ref 0–99)
NonHDL: 91.57
Total CHOL/HDL Ratio: 5
Triglycerides: 118 mg/dL (ref 0.0–149.0)
VLDL: 23.6 mg/dL (ref 0.0–40.0)

## 2017-05-23 LAB — HEMOGLOBIN A1C: Hgb A1c MFr Bld: 9.5 % — ABNORMAL HIGH (ref 4.6–6.5)

## 2017-05-30 ENCOUNTER — Other Ambulatory Visit: Payer: Self-pay | Admitting: Family Medicine

## 2017-06-08 ENCOUNTER — Other Ambulatory Visit: Payer: Self-pay

## 2017-06-08 DIAGNOSIS — E119 Type 2 diabetes mellitus without complications: Secondary | ICD-10-CM

## 2017-06-08 DIAGNOSIS — E890 Postprocedural hypothyroidism: Secondary | ICD-10-CM

## 2017-06-08 MED ORDER — INSULIN NPH (HUMAN) (ISOPHANE) 100 UNIT/ML ~~LOC~~ SUSP: SUBCUTANEOUS | 11 refills | Status: DC

## 2017-06-25 ENCOUNTER — Other Ambulatory Visit: Payer: Self-pay | Admitting: Family Medicine

## 2017-07-10 LAB — HM DIABETES EYE EXAM

## 2017-07-13 ENCOUNTER — Other Ambulatory Visit: Payer: Self-pay | Admitting: Family Medicine

## 2017-07-13 NOTE — Telephone Encounter (Signed)
Received refill request for atorvastatin (LIPITOR) 20 MG tablet losartan (COZAAR) 100 MG tablet and atorvastatin (LIPITOR) 20 MG tablet. Last office visit 05/22/17 and refill 05/09/16 (losartan) and 01/29/17 (atorvastatin). Refill sent

## 2017-07-21 ENCOUNTER — Other Ambulatory Visit: Payer: Self-pay | Admitting: Family Medicine

## 2017-07-21 DIAGNOSIS — M5442 Lumbago with sciatica, left side: Secondary | ICD-10-CM

## 2017-07-27 ENCOUNTER — Other Ambulatory Visit: Payer: Self-pay | Admitting: Family Medicine

## 2017-08-27 ENCOUNTER — Other Ambulatory Visit: Payer: Self-pay | Admitting: Family Medicine

## 2017-10-01 ENCOUNTER — Other Ambulatory Visit: Payer: Self-pay | Admitting: Family Medicine

## 2017-10-06 ENCOUNTER — Other Ambulatory Visit: Payer: Self-pay | Admitting: Family Medicine

## 2017-10-24 ENCOUNTER — Other Ambulatory Visit: Payer: Self-pay | Admitting: Family Medicine

## 2017-10-24 DIAGNOSIS — K219 Gastro-esophageal reflux disease without esophagitis: Secondary | ICD-10-CM

## 2017-10-24 DIAGNOSIS — E890 Postprocedural hypothyroidism: Secondary | ICD-10-CM

## 2017-10-24 NOTE — Telephone Encounter (Signed)
Per note from Dr. Etter Sjogren on 05/22/17, omeprazole has been discontinued; rx request denied. Levothyroxine filled. Routed to Dr. Etter Sjogren.

## 2017-10-24 NOTE — Telephone Encounter (Signed)
Refill x1 month--- pt needs ov

## 2017-10-26 NOTE — Telephone Encounter (Signed)
Author phoned pt. to update on med refills and schedule appointment for OV per Dr. Carollee Herter. Pt. stated he would call back to make an appointment for August.

## 2017-11-02 ENCOUNTER — Other Ambulatory Visit: Payer: Self-pay | Admitting: Family Medicine

## 2017-11-02 DIAGNOSIS — M5442 Lumbago with sciatica, left side: Secondary | ICD-10-CM

## 2017-11-25 ENCOUNTER — Other Ambulatory Visit: Payer: Self-pay | Admitting: Family Medicine

## 2017-12-04 ENCOUNTER — Encounter: Payer: Self-pay | Admitting: Family Medicine

## 2017-12-04 ENCOUNTER — Ambulatory Visit: Payer: 59 | Admitting: Family Medicine

## 2017-12-04 VITALS — BP 139/65 | HR 80 | Temp 98.2°F | Ht 74.0 in | Wt 304.2 lb

## 2017-12-04 DIAGNOSIS — I10 Essential (primary) hypertension: Secondary | ICD-10-CM

## 2017-12-04 DIAGNOSIS — E785 Hyperlipidemia, unspecified: Secondary | ICD-10-CM | POA: Diagnosis not present

## 2017-12-04 DIAGNOSIS — E1151 Type 2 diabetes mellitus with diabetic peripheral angiopathy without gangrene: Secondary | ICD-10-CM

## 2017-12-04 DIAGNOSIS — E118 Type 2 diabetes mellitus with unspecified complications: Secondary | ICD-10-CM | POA: Diagnosis not present

## 2017-12-04 DIAGNOSIS — E890 Postprocedural hypothyroidism: Secondary | ICD-10-CM

## 2017-12-04 DIAGNOSIS — Z794 Long term (current) use of insulin: Secondary | ICD-10-CM

## 2017-12-04 DIAGNOSIS — E039 Hypothyroidism, unspecified: Secondary | ICD-10-CM | POA: Diagnosis not present

## 2017-12-04 DIAGNOSIS — Z23 Encounter for immunization: Secondary | ICD-10-CM | POA: Diagnosis not present

## 2017-12-04 DIAGNOSIS — E1165 Type 2 diabetes mellitus with hyperglycemia: Secondary | ICD-10-CM

## 2017-12-04 DIAGNOSIS — IMO0002 Reserved for concepts with insufficient information to code with codable children: Secondary | ICD-10-CM

## 2017-12-04 LAB — COMPREHENSIVE METABOLIC PANEL
ALT: 32 U/L (ref 0–53)
AST: 28 U/L (ref 0–37)
Albumin: 4.4 g/dL (ref 3.5–5.2)
Alkaline Phosphatase: 45 U/L (ref 39–117)
BUN: 43 mg/dL — ABNORMAL HIGH (ref 6–23)
CO2: 26 mEq/L (ref 19–32)
Calcium: 9.9 mg/dL (ref 8.4–10.5)
Chloride: 108 mEq/L (ref 96–112)
Creatinine, Ser: 1.79 mg/dL — ABNORMAL HIGH (ref 0.40–1.50)
GFR: 50.08 mL/min — ABNORMAL LOW (ref >60.00)
Glucose, Bld: 146 mg/dL — ABNORMAL HIGH (ref 70–99)
Potassium: 5.1 mEq/L (ref 3.5–5.1)
Sodium: 141 mEq/L (ref 135–145)
Total Bilirubin: 0.5 mg/dL (ref 0.2–1.2)
Total Protein: 7.1 g/dL (ref 6.0–8.3)

## 2017-12-04 LAB — LIPID PANEL
Cholesterol: 124 mg/dL (ref 0–200)
HDL: 23.9 mg/dL — ABNORMAL LOW (ref >39.00)
NonHDL: 100.05
Total CHOL/HDL Ratio: 5
Triglycerides: 222 mg/dL — ABNORMAL HIGH (ref 0.0–149.0)
VLDL: 44.4 mg/dL — ABNORMAL HIGH (ref 0.0–40.0)

## 2017-12-04 LAB — LDL CHOLESTEROL, DIRECT: Direct LDL: 73 mg/dL

## 2017-12-04 LAB — TSH: TSH: 1.58 u[IU]/mL (ref 0.35–4.50)

## 2017-12-04 NOTE — Patient Instructions (Signed)
Hypothyroidism Hypothyroidism is a disorder of the thyroid. The thyroid is a large gland that is located in the lower front of the neck. The thyroid releases hormones that control how the body works. With hypothyroidism, the thyroid does not make enough of these hormones. What are the causes? Causes of hypothyroidism may include:  Viral infections.  Pregnancy.  Your own defense system (immune system) attacking your thyroid.  Certain medicines.  Birth defects.  Past radiation treatments to your head or neck.  Past treatment with radioactive iodine.  Past surgical removal of part or all of your thyroid.  Problems with the gland that is located in the center of your brain (pituitary).  What are the signs or symptoms? Signs and symptoms of hypothyroidism may include:  Feeling as though you have no energy (lethargy).  Inability to tolerate cold.  Weight gain that is not explained by a change in diet or exercise habits.  Dry skin.  Coarse hair.  Menstrual irregularity.  Slowing of thought processes.  Constipation.  Sadness or depression.  How is this diagnosed? Your health care provider may diagnose hypothyroidism with blood tests and ultrasound tests. How is this treated? Hypothyroidism is treated with medicine that replaces the hormones that your body does not make. After you begin treatment, it may take several weeks for symptoms to go away. Follow these instructions at home:  Take medicines only as directed by your health care provider.  If you start taking any new medicines, tell your health care provider.  Keep all follow-up visits as directed by your health care provider. This is important. As your condition improves, your dosage needs may change. You will need to have blood tests regularly so that your health care provider can watch your condition. Contact a health care provider if:  Your symptoms do not get better with treatment.  You are taking thyroid  replacement medicine and: ? You sweat excessively. ? You have tremors. ? You feel anxious. ? You lose weight rapidly. ? You cannot tolerate heat. ? You have emotional swings. ? You have diarrhea. ? You feel weak. Get help right away if:  You develop chest pain.  You develop an irregular heartbeat.  You develop a rapid heartbeat. This information is not intended to replace advice given to you by your health care provider. Make sure you discuss any questions you have with your health care provider. Document Released: 04/03/2005 Document Revised: 09/09/2015 Document Reviewed: 08/19/2013 Elsevier Interactive Patient Education  2018 [employer] Inc[employer]  

## 2017-12-05 NOTE — Progress Notes (Signed)
Patient ID: Alex Rocha, male    DOB: 1955/12/05  Age: 62 y.o. MRN: [id number]    Subjective:  Subjective  HPI Alex Rocha presents for bp and cholesterol Dm per endo No complaints  HPI HYPERTENSION   Blood pressure range-not checking   Chest pain- no      Dyspnea- no Lightheadedness- no   Edema- no  Other side effects - no   Medication compliance: good Low salt diet- yes    DIABETES    Blood Sugar ranges-per endo  Polyuria- no New Visual problems- no  Hypoglycemic symptoms- no  Other side effects-no Medication compliance - good per pt Foot exam- endo   HYPERLIPIDEMIA  Medication compliance- good RUQ pain- no  Muscle aches- no Other side effects-no  Review of Systems  Constitutional: Negative for chills and fever.  HENT: Negative for congestion and hearing loss.   Eyes: Negative for discharge.  Respiratory: Negative for cough and shortness of breath.   Cardiovascular: Negative for chest pain, palpitations and leg swelling.  Gastrointestinal: Negative for abdominal pain, blood in stool, constipation, diarrhea, nausea and vomiting.  Genitourinary: Negative for dysuria, frequency, hematuria and urgency.  Musculoskeletal: Negative for back pain and myalgias.  Skin: Negative for rash.  Allergic/Immunologic: Negative for environmental allergies.  Neurological: Negative for dizziness, weakness and headaches.  Hematological: Does not bruise/bleed easily.  Psychiatric/Behavioral: Negative for suicidal ideas. The patient is not nervous/anxious.     History Past Medical History:  Diagnosis Date  . Colon polyps   . Diabetes mellitus   . Environmental allergies   . Hyperlipidemia   . Hypertension   . Hyperthyroidism   . Kidney stones   . Low testosterone     He has no past surgical history on file.   His family history includes Colon cancer in his brother and mother; Diabetes in his mother and unknown relative; Heart disease in his mother and sister;  Hyperlipidemia in his brother; Hypertension in his mother.He reports that he has never smoked. He has never used smokeless tobacco. He reports that he drinks alcohol. He reports that he does not use drugs.  Current Outpatient Medications on File Prior to Visit  Medication Sig Dispense Refill  . atorvastatin (LIPITOR) 20 MG tablet Take 1 tablet (20 mg total) by mouth daily. 30 tablet 5  . diclofenac sodium (VOLTAREN) 1 % GEL Apply 4 g topically 4 (four) times daily. 100 g 1  . fenofibrate 160 MG tablet TAKE 1 TABLET BY MOUTH EVERY DAY 30 tablet 4  . insulin NPH Human (NOVOLIN N) 100 UNIT/ML injection 130 units each morning and 90 units each evening, and syringes 2/day 80 mL 11  . Insulin Syringe-Needle U-100 (B-D INS SYR ULTRAFINE 1CC/31G) 31G X 5/16" 1 ML MISC 1 strip by Other route 2 (two) times daily. And lancets 2/day 200 each 3  . levothyroxine (SYNTHROID, LEVOTHROID) 100 MCG tablet TAKE 1 TABLET BY MOUTH ONCE DAILY BEFORE BREAKFAST 30 tablet 5  . losartan (COZAAR) 100 MG tablet Take 1 tablet (100 mg total) by mouth daily. 90 tablet 1  . losartan (COZAAR) 100 MG tablet TAKE 1 TABLET (100 MG TOTAL) BY MOUTH DAILY. 30 tablet 5  . meloxicam (MOBIC) 7.5 MG tablet TAKE 1 TO 2 TABLETS BY MOUTH EVERY DAY AS NEEDED 60 tablet 1   No current facility-administered medications on file prior to visit.      Objective:  Objective  Physical Exam  Constitutional: He is oriented to person, place, and time. Vital signs  are normal. He appears well-developed and well-nourished. He is sleeping.  HENT:  Head: Normocephalic and atraumatic.  Mouth/Throat: Oropharynx is clear and moist.  Eyes: Pupils are equal, round, and reactive to light. EOM are normal.  Neck: Normal range of motion. Neck supple. No thyromegaly present.  Cardiovascular: Normal rate and regular rhythm.  No murmur heard. Pulmonary/Chest: Effort normal and breath sounds normal. No respiratory distress. He has no wheezes. He has no rales. He  exhibits no tenderness.  Musculoskeletal: He exhibits no edema or tenderness.  Neurological: He is alert and oriented to person, place, and time.  Skin: Skin is warm and dry.  Psychiatric: He has a normal mood and affect. His behavior is normal. Judgment and thought content normal.  Nursing note and vitals reviewed.  BP 139/65 (BP Location: Left Arm, Patient Position: Sitting, Cuff Size: Large)   Pulse 80   Temp 98.2 F (36.8 C) (Oral)   Ht 6\' 2"  (1.88 m)   Wt (!) 304 lb 3.2 oz (138 kg)   SpO2 99%   BMI 39.06 kg/m  Wt Readings from Last 3 Encounters:  12/04/17 (!) 304 lb 3.2 oz (138 kg)  05/22/17 (!) 310 lb 9.6 oz (140.9 kg)  03/02/17 (!) 301 lb (136.5 kg)     Lab Results  Component Value Date   WBC 6.7 12/29/2014   HGB 13.8 12/29/2014   HCT 41.3 12/29/2014   PLT 206 12/29/2014   GLUCOSE 146 (H) 12/04/2017   CHOL 124 12/04/2017   TRIG 222.0 (H) 12/04/2017   HDL 23.90 (L) 12/04/2017   LDLDIRECT 73.0 12/04/2017   LDLCALC 68 05/22/2017   ALT 32 12/04/2017   AST 28 12/04/2017   NA 141 12/04/2017   K 5.1 12/04/2017   CL 108 12/04/2017   CREATININE 1.79 (H) 12/04/2017   BUN 43 (H) 12/04/2017   CO2 26 12/04/2017   TSH 1.58 12/04/2017   HGBA1C 9.5 (H) 05/22/2017   MICROALBUR 1.4 05/22/2017    No results found.   Assessment & Plan:  Plan  I have discontinued Alex Rocha's triamcinolone cream, fluticasone, cyclobenzaprine, and omeprazole. I am also having him maintain his losartan, Insulin Syringe-Needle U-100, diclofenac sodium, insulin NPH Human, atorvastatin, losartan, fenofibrate, levothyroxine, and meloxicam.  No orders of the defined types were placed in this encounter.   Problem List Items Addressed This Visit      Unprioritized   DM (diabetes mellitus) type II uncontrolled, periph vascular disorder ([place])    Per endo      HTN (hypertension)    Well controlled, no changes to meds. Encouraged heart healthy diet such as the DASH diet and exercise as  tolerated.       Relevant Orders   TSH (Completed)   Lipid panel (Completed)   Comprehensive metabolic panel (Completed)   Hyperlipidemia LDL goal <70    Tolerating statin, encouraged heart healthy diet, avoid trans fats, minimize simple carbs and saturated fats. Increase exercise as tolerated      Hypothyroidism following radioiodine therapy    Per endo       Other Visit Diagnoses    Hypothyroidism, unspecified type    -  Primary   Relevant Orders   TSH (Completed)   Hyperlipidemia, unspecified hyperlipidemia type       Relevant Orders   TSH (Completed)   Lipid panel (Completed)   Comprehensive metabolic panel (Completed)   Type 2 diabetes mellitus with complication, with long-term current use of insulin ([place])  Need for shingles vaccine       Relevant Orders   Varicella-zoster vaccine IM (Shingrix) (Completed)      Follow-up: Return in about 6 months (around 06/06/2018) for annual exam, fasting.  Ann Held, DO

## 2017-12-05 NOTE — Assessment & Plan Note (Signed)
Tolerating statin, encouraged heart healthy diet, avoid trans fats, minimize simple carbs and saturated fats. Increase exercise as tolerated 

## 2017-12-05 NOTE — Assessment & Plan Note (Signed)
Per endo °

## 2017-12-05 NOTE — Assessment & Plan Note (Signed)
Well controlled, no changes to meds. Encouraged heart healthy diet such as the DASH diet and exercise as tolerated.  °

## 2017-12-07 ENCOUNTER — Other Ambulatory Visit: Payer: Self-pay

## 2017-12-07 DIAGNOSIS — I10 Essential (primary) hypertension: Secondary | ICD-10-CM

## 2017-12-07 DIAGNOSIS — R799 Abnormal finding of blood chemistry, unspecified: Secondary | ICD-10-CM

## 2017-12-07 DIAGNOSIS — E785 Hyperlipidemia, unspecified: Secondary | ICD-10-CM

## 2017-12-07 MED ORDER — ATORVASTATIN CALCIUM 40 MG PO TABS: 40.0000 mg | ORAL_TABLET | Freq: Every day | ORAL | 2 refills | Status: DC

## 2018-01-02 ENCOUNTER — Ambulatory Visit: Payer: 59 | Admitting: Endocrinology

## 2018-01-02 ENCOUNTER — Encounter: Payer: Self-pay | Admitting: Endocrinology

## 2018-01-02 ENCOUNTER — Other Ambulatory Visit: Payer: Self-pay | Admitting: Family Medicine

## 2018-01-02 VITALS — BP 132/80 | HR 79 | Ht 74.0 in | Wt 299.8 lb

## 2018-01-02 DIAGNOSIS — E119 Type 2 diabetes mellitus without complications: Secondary | ICD-10-CM | POA: Diagnosis not present

## 2018-01-02 DIAGNOSIS — M5442 Lumbago with sciatica, left side: Secondary | ICD-10-CM

## 2018-01-02 DIAGNOSIS — Z794 Long term (current) use of insulin: Secondary | ICD-10-CM | POA: Diagnosis not present

## 2018-01-02 DIAGNOSIS — E890 Postprocedural hypothyroidism: Secondary | ICD-10-CM | POA: Diagnosis not present

## 2018-01-02 LAB — POCT GLYCOSYLATED HEMOGLOBIN (HGB A1C): Hemoglobin A1C: 7.9 % — AB (ref 4.0–5.6)

## 2018-01-02 MED ORDER — INSULIN NPH (HUMAN) (ISOPHANE) 100 UNIT/ML ~~LOC~~ SUSP: SUBCUTANEOUS | 11 refills | Status: DC

## 2018-01-02 NOTE — Patient Instructions (Addendum)
Please change the insulin to 120 units each morning and 100 units each evening To prevent low blood sugar, you should eat earlier in the day, take the morning insulin later in the morning, or both. check your blood sugar twice a day.  vary the time of day when you check, between before the 3 meals, and at bedtime.  also check if you have symptoms of your blood sugar being too high or too low.  please keep a record of the readings and bring it to your next appointment here.  You can write it on any piece of paper.  please call us sooner if your blood sugar goes below 70, or if you have a lot of readings over 200.   On this type of insulin schedule, you should eat meals on a regular schedule (especially lunch).  If a meal is missed or significantly delayed, your blood sugar could go low.   Please come back for a follow-up appointment in 4 months.

## 2018-01-02 NOTE — Progress Notes (Signed)
Subjective:    Patient ID: Alex Rocha, male    DOB: 01/20/1956, 62 y.o.   MRN: [id number]  HPI Pt returns for f/u of diabetes mellitus: DM type: Insulin-requiring type 2. Dx'ed: 2000.  Complications: polyneuropathy and renal insuff Therapy: insulin since 2014.  DKA: never.  Severe hypoglycemia: once, in 2018 Pancreatitis: never.   Other: he declines multiple daily injections; ; he says his need to care for his wife, who has cancer, limits his ability to care for himself; he declines weight-loss surgery; he takes human insulin, due to cost.   Interval history:  no cbg record, but states cbg's are mildly low at lunch approx once per week. This usually happens at lunch.  He says there is otherwise no trend throughout the day.  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    Past Medical History:  Diagnosis Date  . Colon polyps   . Diabetes mellitus   . Environmental allergies   . Hyperlipidemia   . Hypertension   . Hyperthyroidism   . Kidney stones   . Low testosterone     No past surgical history on file.  Social History   Socioeconomic History  . Marital status: Married    Spouse name: Not on file  . Number of children: 2  . Years of education: Not on file  . Highest education level: Not on file  Occupational History  . Occupation: Nurse, adult  Social Needs  . Financial resource strain: Not on file  . Food insecurity:    Worry: Not on file    Inability: Not on file  . Transportation needs:    Medical: Not on file    Non-medical: Not on file  Tobacco Use  . Smoking status: Never Smoker  . Smokeless tobacco: Never Used  Substance and Sexual Activity  . Alcohol use: Yes    Alcohol/week: 0.0 standard drinks    Comment: social  . Drug use: No  . Sexual activity: Not on file  Lifestyle  . Physical activity:    Days per week: Not on  file    Minutes per session: Not on file  . Stress: Not on file  Relationships  . Social connections:    Talks on phone: Not on file    Gets together: Not on file    Attends religious service: Not on file    Active member of club or organization: Not on file    Attends meetings of clubs or organizations: Not on file    Relationship status: Not on file  . Intimate partner violence:    Fear of current or ex partner: Not on file    Emotionally abused: Not on file    Physically abused: Not on file    Forced sexual activity: Not on file  Other Topics Concern  . Not on file  Social History Narrative  . Not on file    Current Outpatient Medications on File Prior to Visit  Medication Sig Dispense Refill  . atorvastatin (LIPITOR) 40 MG tablet Take 1 tablet (40 mg total) by mouth at bedtime. 30 tablet 2  . diclofenac sodium (VOLTAREN) 1 % GEL Apply 4 g topically 4 (four) times daily. 100 g 1  . fenofibrate 160 MG tablet TAKE 1 TABLET BY MOUTH EVERY DAY 30 tablet 4  . Insulin Syringe-Needle U-100 (B-D INS SYR ULTRAFINE 1CC/31G)  31G X 5/16" 1 ML MISC 1 strip by Other route 2 (two) times daily. And lancets 2/day 200 each 3  . levothyroxine (SYNTHROID, LEVOTHROID) 100 MCG tablet TAKE 1 TABLET BY MOUTH ONCE DAILY BEFORE BREAKFAST 30 tablet 5  . losartan (COZAAR) 100 MG tablet Take 1 tablet (100 mg total) by mouth daily. 90 tablet 1   No current facility-administered medications on file prior to visit.     Allergies  Allergen Reactions  . [name] [Yohimbine] Palpitations    Family History  Problem Relation Age of Onset  . Colon cancer Mother   . Heart disease Mother   . Hypertension Mother        Mother's side of the family  . Diabetes Mother        Mother's side of the family  . Colon cancer Brother   . Hyperlipidemia Brother        Mother's side of the family  . Heart disease Sister   . Diabetes Unknown        father's family    BP 132/80 (BP Location: Left Arm)   Pulse 79    Ht 6\' 2"  (1.88 m)   Wt 299 lb 12.8 oz (136 kg)   SpO2 96%   BMI 38.49 kg/m    Review of Systems Denies LOC.      Objective:   Physical Exam VITAL SIGNS:  See vs page GENERAL: no distress Pulses: foot pulses are intact bilaterally.   MSK: no deformity of the feet or ankles.  CV: 1+ bilat leg edema of the legs. Skin:  no ulcer on the feet or ankles.  normal color and temp on the feet and ankles Neuro: sensation is intact to touch on the feet and ankles, but decreased from normal Ext: There is bilateral onychomycosis of the toenails.  Lab Results  Component Value Date   TSH 1.58 12/04/2017   T4TOTAL 6.0 04/24/2014    Lab Results  Component Value Date   HGBA1C 7.9 (A) 01/02/2018   Lab Results  Component Value Date   CREATININE 1.79 (H) 12/04/2017   BUN 43 (H) 12/04/2017   NA 141 12/04/2017   K 5.1 12/04/2017   CL 108 12/04/2017   CO2 26 12/04/2017   Lab Results  Component Value Date   CHOL 124 12/04/2017   HDL 23.90 (L) 12/04/2017   LDLCALC 68 05/22/2017   LDLDIRECT 73.0 12/04/2017   TRIG 222.0 (H) 12/04/2017   CHOLHDL 5 12/04/2017      Assessment & Plan:  Insulin-requiring type 2 DM, with polyneuropathy: this is the best control this pt should aim for, given this regimen, which does match insulin to her changing needs throughout the day Hypoglycemia: The pattern of his cbg's indicates he needs some adjustment in his insulin.  Renal insuff: he prob needs much more am insulin than pm, but we'll take this in stages.   Patient Instructions  Please change the insulin to 120 units each morning and 100 units each evening To prevent low blood sugar, you should eat earlier in the day, take the morning insulin later in the morning, or both. check your blood sugar twice a day.  vary the time of day when you check, between before the 3 meals, and at bedtime.  also check if you have symptoms of your blood sugar being too high or too low.  please keep a record of the  readings and bring it to your next appointment here.  You can write it  on any piece of paper.  please call us sooner if your blood sugar goes below 70, or if you have a lot of readings over 200.   On this type of insulin schedule, you should eat meals on a regular schedule (especially lunch).  If a meal is missed or significantly delayed, your blood sugar could go low.   Please come back for a follow-up appointment in 4 months.

## 2018-01-04 ENCOUNTER — Other Ambulatory Visit: Payer: Self-pay | Admitting: Family Medicine

## 2018-01-05 ENCOUNTER — Other Ambulatory Visit: Payer: Self-pay | Admitting: Family Medicine

## 2018-01-07 DIAGNOSIS — Z23 Encounter for immunization: Secondary | ICD-10-CM | POA: Diagnosis not present

## 2018-01-14 ENCOUNTER — Other Ambulatory Visit: Payer: Self-pay | Admitting: Endocrinology

## 2018-01-14 DIAGNOSIS — E119 Type 2 diabetes mellitus without complications: Secondary | ICD-10-CM

## 2018-01-14 DIAGNOSIS — E890 Postprocedural hypothyroidism: Secondary | ICD-10-CM

## 2018-01-16 ENCOUNTER — Encounter: Payer: Self-pay | Admitting: *Deleted

## 2018-02-05 ENCOUNTER — Ambulatory Visit (INDEPENDENT_AMBULATORY_CARE_PROVIDER_SITE_OTHER): Payer: 59

## 2018-02-05 DIAGNOSIS — Z23 Encounter for immunization: Secondary | ICD-10-CM

## 2018-03-01 ENCOUNTER — Other Ambulatory Visit: Payer: Self-pay | Admitting: Family Medicine

## 2018-03-01 DIAGNOSIS — M5442 Lumbago with sciatica, left side: Secondary | ICD-10-CM

## 2018-03-03 ENCOUNTER — Other Ambulatory Visit: Payer: Self-pay | Admitting: Family Medicine

## 2018-03-29 ENCOUNTER — Other Ambulatory Visit: Payer: Self-pay | Admitting: Family Medicine

## 2018-04-23 ENCOUNTER — Encounter: Payer: Self-pay | Admitting: Internal Medicine

## 2018-04-24 ENCOUNTER — Other Ambulatory Visit: Payer: Self-pay | Admitting: Family Medicine

## 2018-04-24 DIAGNOSIS — M5442 Lumbago with sciatica, left side: Secondary | ICD-10-CM

## 2018-04-24 NOTE — Progress Notes (Signed)
[facility]       Assessment and Plan:  Severe obstructive sleep apnea. --Home sleep study 12/28/15 >> AHI 86.5, SaO2 low 66. -Patient is is feeling much better, he is no longer snoring, he is adequately compliant with CPAP and benefiting from its use.  -Continue on auto-CPAP 10-20.  (Had previously ordered changed to 10-25 but this may have not been possible).  Diabetes mellitus. -Sleep apnea can worsen blood sugar control.  Hypothyroidism. - continue thyroid hormone replacement.  Essential hypertension. -Sleep apnea can contribute to elevated blood pressure, therefore should remain on CPAP.   Obesity. -This can contribute to sleep apnea, recommended weight loss.   Return in about 1 year (around 04/26/2019).   Date: 04/24/2018  MRN# [id number] Alex Rocha [birth year]    Alex Rocha is a 63 y.o. old male seen in consultation for chief complaint of:    Chief Complaint  Patient presents with  . Follow-up    follow up for CPAP-machine had no card    HPI:  Patient is 63 year old male with severe OSA with AHI of 86.  He has been maintained on CPAP with a pressure range of 10-20. He has been using his CPAP every night he feels more awake during the day, he wakes refreshed. He is no longer snoring. He cleans his supplies about once per week.  He is on a full face mask.  He is more awake during the day and is no longer snoring.    **CPAP download 03/25/2018-04/23/2018>> raw data personally reviewed.  Usage greater than 4 hours is 27/30 days.  Average usage on days used is 6 hours 15 minutes, pressure range 10-20.  Median pressure 17, 95th percentile pressure 19.5, maximum pressure 19.7.  Residual AHI is 1.4.  Overall this shows very good compliance with excellent control of obstructive sleep apnea. **Review of download data 30 days as of 03/01/17, uses greater than 4 hours is 20 days.  Average usage on days used was 4 hours 52 minutes.  Pressure is auto 10-20.   Median pressure is 15.7, 95th percentile is 19.    Medication:    Current Outpatient Medications:  .  atorvastatin (LIPITOR) 40 MG tablet, TAKE 1 TABLET BY MOUTH EVERYDAY AT BEDTIME*MAX ON INSUR*, Disp: 90 tablet, Rfl: 1 .  diclofenac sodium (VOLTAREN) 1 % GEL, APPLY 4 G TOPICALLY 4 (FOUR) TIMES DAILY., Disp: 100 g, Rfl: 1 .  fenofibrate 160 MG tablet, Take 1 tablet (160 mg total) by mouth daily., Disp: 90 tablet, Rfl: 1 .  insulin NPH Human (HUMULIN N) 100 UNIT/ML injection, INJECT 130 UNITS EACH MORNING AND 90 UNITS EACH EVENING, Disp: 80 mL, Rfl: 11 .  insulin NPH Human (NOVOLIN N) 100 UNIT/ML injection, 120 units each morning and 100 units each evening, and syringes 2/day, Disp: 80 mL, Rfl: 11 .  Insulin Syringe-Needle U-100 (B-D INS SYR ULTRAFINE 1CC/31G) 31G X 5/16" 1 ML MISC, 1 strip by Other route 2 (two) times daily. And lancets 2/day, Disp: 200 each, Rfl: 3 .  levothyroxine (SYNTHROID, LEVOTHROID) 100 MCG tablet, TAKE 1 TABLET BY MOUTH ONCE DAILY BEFORE BREAKFAST, Disp: 30 tablet, Rfl: 5 .  losartan (COZAAR) 100 MG tablet, Take 1 tablet (100 mg total) by mouth daily., Disp: 90 tablet, Rfl: 1 .  losartan (COZAAR) 100 MG tablet, TAKE 1 TABLET BY MOUTH EVERY DAY, Disp: 90 tablet, Rfl: 1 .  meloxicam (MOBIC) 7.5 MG tablet, TAKE 1 TO 2 TABLETS BY MOUTH EVERY DAY AS NEEDED, Disp:  60 tablet, Rfl: 1   Allergies:  [name] [yohimbine]  Review of Systems:  Constitutional: Feels well. Cardiovascular: Denies chest pain, exertional chest pain.  Pulmonary: Denies hemoptysis, pleuritic chest pain.   The remainder of systems were reviewed and were found to be negative other than what is documented in the HPI.    Physical Examination:   VS: BP 126/82 (BP Location: Right Arm, Cuff Size: Large)   Pulse 75   Ht 6\' 1"  (1.854 m)   Wt (!) 304 lb 12.8 oz (138.3 kg)   SpO2 97%   BMI 40.21 kg/m   General Appearance: No distress  Neuro:without focal findings, mental status, speech normal, alert  and oriented HEENT: PERRLA, EOM intact Pulmonary: No wheezing, No rales  CardiovascularNormal S1,S2.  No m/r/g.  Abdomen: Benign, Soft, non-tender, No masses Renal:  No costovertebral tenderness  GU:  No performed at this time. Endoc: No evident thyromegaly, no signs of acromegaly or Cushing features Skin:   warm, no rashes, no ecchymosis  Extremities: normal, no cyanosis, clubbing.      LABORATORY PANEL:   CBC No results for input(s): WBC, HGB, HCT, PLT in the last 168 hours. ------------------------------------------------------------------------------------------------------------------  Chemistries  No results for input(s): NA, K, CL, CO2, GLUCOSE, BUN, CREATININE, CALCIUM, MG, AST, ALT, ALKPHOS, BILITOT in the last 168 hours.  Invalid input(s): GFRCGP ------------------------------------------------------------------------------------------------------------------  Cardiac Enzymes No results for input(s): TROPONINI in the last 168 hours. ------------------------------------------------------------  RADIOLOGY:  No results found.     Thank  you for the consultation and for allowing [facility] to assist in the care of your patient. Our recommendations are noted above.  Please contact us if we can be of further service.  Marda Stalker, M.D., F.C.C.P.  Board Certified in Internal Medicine, Pulmonary Medicine, [place], and Sleep Medicine.  [facility] Office Number: [phone]   04/24/2018

## 2018-04-25 ENCOUNTER — Ambulatory Visit: Payer: 59 | Admitting: Internal Medicine

## 2018-04-25 ENCOUNTER — Encounter: Payer: Self-pay | Admitting: Internal Medicine

## 2018-04-25 VITALS — BP 126/82 | HR 75 | Ht 73.0 in | Wt 304.8 lb

## 2018-04-25 DIAGNOSIS — G4733 Obstructive sleep apnea (adult) (pediatric): Secondary | ICD-10-CM | POA: Diagnosis not present

## 2018-04-25 NOTE — Patient Instructions (Signed)
Continue using cpap every night.  

## 2018-05-06 ENCOUNTER — Encounter: Payer: Self-pay | Admitting: Endocrinology

## 2018-05-06 ENCOUNTER — Ambulatory Visit: Payer: 59 | Admitting: Endocrinology

## 2018-05-06 VITALS — BP 136/60 | HR 81 | Ht 73.0 in | Wt 304.8 lb

## 2018-05-06 DIAGNOSIS — E119 Type 2 diabetes mellitus without complications: Secondary | ICD-10-CM

## 2018-05-06 DIAGNOSIS — Z794 Long term (current) use of insulin: Secondary | ICD-10-CM | POA: Diagnosis not present

## 2018-05-06 DIAGNOSIS — E890 Postprocedural hypothyroidism: Secondary | ICD-10-CM

## 2018-05-06 LAB — POCT GLYCOSYLATED HEMOGLOBIN (HGB A1C): Hemoglobin A1C: 8.4 % — AB (ref 4.0–5.6)

## 2018-05-06 MED ORDER — GLUCOSE BLOOD VI STRP: 1.0000 | ORAL_STRIP | Freq: Two times a day (BID) | 3 refills | Status: DC

## 2018-05-06 MED ORDER — INSULIN NPH (HUMAN) (ISOPHANE) 100 UNIT/ML ~~LOC~~ SUSP: SUBCUTANEOUS | 11 refills | Status: DC

## 2018-05-06 NOTE — Patient Instructions (Addendum)
Please change the insulin to 140 units each morning (if you are going to be active, take just 100 units), and 100 units each evening.  To prevent low blood sugar, you should eat earlier in the day, take the morning insulin later in the morning, or both. check your blood sugar twice a day.  vary the time of day when you check, between before the 3 meals, and at bedtime.  also check if you have symptoms of your blood sugar being too high or too low.  please keep a record of the readings and bring it to your next appointment here.  You can write it on any piece of paper.  please call us sooner if your blood sugar goes below 70, or if you have a lot of readings over 200.   On this type of insulin schedule, you should eat meals on a regular schedule (especially lunch).  If a meal is missed or significantly delayed, your blood sugar could go low.   Here is a new meter.  I have sent a prescription to your pharmacy, for strips.  Please come back for a follow-up appointment in 2 months.

## 2018-05-06 NOTE — Progress Notes (Signed)
Subjective:    Patient ID: Alex Rocha, male    DOB: 1955-10-19, 63 y.o.   MRN: [id number]  HPI Pt returns for f/u of diabetes mellitus: DM type: Insulin-requiring type 2. Dx'ed: 2000.  Complications: polyneuropathy and renal insuff Therapy: insulin since 2014.  DKA: never.  Severe hypoglycemia: once, in 2018 Pancreatitis: never.   Other: he declines multiple daily injections; ; he says his need to care for his wife, who has cancer, limits his ability to care for himself; he declines weight-loss surgery; he takes human insulin, due to cost.   Interval history:  Pt says he lost cbg meter.  He seldom feels as though he has mild hypoglycemia.  This happens in the early afternoon, if he is active.  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   Past Medical History:  Diagnosis Date  . Colon polyps   . Diabetes mellitus   . Environmental allergies   . Hyperlipidemia   . Hypertension   . Hyperthyroidism   . Kidney stones   . Low testosterone     No past surgical history on file.  Social History   Socioeconomic History  . Marital status: Married    Spouse name: Not on file  . Number of children: 2  . Years of education: Not on file  . Highest education level: Not on file  Occupational History  . Occupation: Nurse, adult  Social Needs  . Financial resource strain: Not on file  . Food insecurity:    Worry: Not on file    Inability: Not on file  . Transportation needs:    Medical: Not on file    Non-medical: Not on file  Tobacco Use  . Smoking status: Never Smoker  . Smokeless tobacco: Never Used  Substance and Sexual Activity  . Alcohol use: Yes    Alcohol/week: 0.0 standard drinks    Comment: social  . Drug use: No  . Sexual activity: Not on file  Lifestyle  . Physical activity:    Days per week: Not on file    Minutes per session: Not  on file  . Stress: Not on file  Relationships  . Social connections:    Talks on phone: Not on file    Gets together: Not on file    Attends religious service: Not on file    Active member of club or organization: Not on file    Attends meetings of clubs or organizations: Not on file    Relationship status: Not on file  . Intimate partner violence:    Fear of current or ex partner: Not on file    Emotionally abused: Not on file    Physically abused: Not on file    Forced sexual activity: Not on file  Other Topics Concern  . Not on file  Social History Narrative  . Not on file    Current Outpatient Medications on File Prior to Visit  Medication Sig Dispense Refill  . atorvastatin (LIPITOR) 40 MG tablet TAKE 1 TABLET BY MOUTH EVERYDAY AT BEDTIME*MAX ON INSUR* 90 tablet 1  . diclofenac sodium (VOLTAREN) 1 % GEL APPLY 4 G TOPICALLY 4 (FOUR) TIMES DAILY. 100 g 1  . fenofibrate 160 MG tablet Take 1 tablet (160 mg total) by mouth daily. 90 tablet 1  . Insulin Syringe-Needle U-100 (B-D INS SYR ULTRAFINE 1CC/31G) 31G X 5/16"  1 ML MISC 1 strip by Other route 2 (two) times daily. And lancets 2/day 200 each 3  . levothyroxine (SYNTHROID, LEVOTHROID) 100 MCG tablet TAKE 1 TABLET BY MOUTH ONCE DAILY BEFORE BREAKFAST 30 tablet 5  . losartan (COZAAR) 100 MG tablet Take 1 tablet (100 mg total) by mouth daily. 90 tablet 1  . meloxicam (MOBIC) 7.5 MG tablet TAKE 1 TO 2 TABLETS BY MOUTH EVERY DAY AS NEEDED 60 tablet 1   No current facility-administered medications on file prior to visit.     Allergies  Allergen Reactions  . [name] [Yohimbine] Palpitations    Family History  Problem Relation Age of Onset  . Colon cancer Mother   . Heart disease Mother   . Hypertension Mother        Mother's side of the family  . Diabetes Mother        Mother's side of the family  . Colon cancer Brother   . Hyperlipidemia Brother        Mother's side of the family  . Heart disease Sister   . Diabetes  Unknown        father's family    BP 136/60 (BP Location: Right Arm, Patient Position: Sitting, Cuff Size: Large)   Pulse 81   Ht 6\' 1"  (1.854 m)   Wt (!) 304 lb 12.8 oz (138.3 kg)   SpO2 94%   BMI 40.21 kg/m    Review of Systems He denies LOC    Objective:   Physical Exam VITAL SIGNS:  See vs page GENERAL: no distress Pulses: foot pulses are intact bilaterally.   MSK: no deformity of the feet or ankles.  CV: 1+ bilat leg edema of the legs. Skin:  no ulcer on the feet or ankles.  normal color and temp on the feet and ankles Neuro: sensation is intact to touch on the feet and ankles, but decreased from normal Ext: There is bilateral onychomycosis of the toenails.    Lab Results  Component Value Date   HGBA1C 8.4 (A) 05/06/2018   Lab Results  Component Value Date   CREATININE 1.79 (H) 12/04/2017   BUN 43 (H) 12/04/2017   NA 141 12/04/2017   K 5.1 12/04/2017   CL 108 12/04/2017   CO2 26 12/04/2017      Assessment & Plan:  Insulin-requiring type 2 DM, with PN: worse Renal insuff: he prob needs still more AM insulin compared to PM, but we'll adjust in increments.  Patient Instructions  Please change the insulin to 140 units each morning (if you are going to be active, take just 100 units), and 100 units each evening.  To prevent low blood sugar, you should eat earlier in the day, take the morning insulin later in the morning, or both. check your blood sugar twice a day.  vary the time of day when you check, between before the 3 meals, and at bedtime.  also check if you have symptoms of your blood sugar being too high or too low.  please keep a record of the readings and bring it to your next appointment here.  You can write it on any piece of paper.  please call us sooner if your blood sugar goes below 70, or if you have a lot of readings over 200.   On this type of insulin schedule, you should eat meals on a regular schedule (especially lunch).  If a meal is missed or  significantly delayed, your blood sugar could go low.  Here is a new meter.  I have sent a prescription to your pharmacy, for strips.  Please come back for a follow-up appointment in 2 months.

## 2018-05-21 ENCOUNTER — Encounter: Payer: Self-pay | Admitting: Internal Medicine

## 2018-05-21 ENCOUNTER — Ambulatory Visit: Payer: 59 | Admitting: Internal Medicine

## 2018-05-21 VITALS — BP 132/68 | HR 78 | Temp 98.5°F | Resp 16 | Ht 73.0 in | Wt 307.2 lb

## 2018-05-21 DIAGNOSIS — J069 Acute upper respiratory infection, unspecified: Secondary | ICD-10-CM | POA: Diagnosis not present

## 2018-05-21 MED ORDER — AMOXICILLIN 500 MG PO CAPS: 1000.0000 mg | ORAL_CAPSULE | Freq: Two times a day (BID) | ORAL | 0 refills | Status: DC

## 2018-05-21 MED ORDER — AZELASTINE HCL 0.1 % NA SOLN: 2.0000 | Freq: Every evening | NASAL | 3 refills | Status: DC | PRN

## 2018-05-21 NOTE — Patient Instructions (Signed)
Rest, fluids , tylenol  For cough:  Take Mucinex DM twice a day as needed until better  For nasal congestion: Use OTC   Flonase : 2 nasal sprays on each side of the nose in the morning until you feel better Use ASTELIN a prescribed spray : 2 nasal sprays on each side of the nose at night until you feel better   Avoid decongestants such as  Pseudoephedrine or phenylephrine     Take the antibiotic as prescribed  (Amoxicillin) only if no better in 3-4 days   Call if not gradually better over the next 7 days  Call anytime if the symptoms are severe

## 2018-05-21 NOTE — Progress Notes (Signed)
Pre visit review using our clinic review tool, if applicable. No additional management support is needed unless otherwise documented below in the visit note. 

## 2018-05-21 NOTE — Progress Notes (Signed)
Subjective:    Patient ID: Alex Rocha, male    DOB: November 19, 1955, 63 y.o.   MRN: [id number]  DOS:  05/21/2018 Type of visit - description: acute Sx started a week ago, he is here because typically URIs last to 3 days. Reports cough with greenish sputum, runny nose, stuffy nose, some chills. Had mild body aches but mostly at the lower extremities. Taking OTCs. No recent travel outside the area, no sick contacts  Review of Systems No fever. + Clear nasal discharge. No chest pain or lower extremity edema No nausea, vomiting, diarrhea. No major headaches  Past Medical History:  Diagnosis Date  . Colon polyps   . Diabetes mellitus   . Environmental allergies   . Hyperlipidemia   . Hypertension   . Hyperthyroidism   . Kidney stones   . Low testosterone     No past surgical history on file.  Social History   Socioeconomic History  . Marital status: Married    Spouse name: Not on file  . Number of children: 2  . Years of education: Not on file  . Highest education level: Not on file  Occupational History  . Occupation: Nurse, adult  Social Needs  . Financial resource strain: Not on file  . Food insecurity:    Worry: Not on file    Inability: Not on file  . Transportation needs:    Medical: Not on file    Non-medical: Not on file  Tobacco Use  . Smoking status: Never Smoker  . Smokeless tobacco: Never Used  Substance and Sexual Activity  . Alcohol use: Yes    Alcohol/week: 0.0 standard drinks    Comment: social  . Drug use: No  . Sexual activity: Not on file  Lifestyle  . Physical activity:    Days per week: Not on file    Minutes per session: Not on file  . Stress: Not on file  Relationships  . Social connections:    Talks on phone: Not on file    Gets together: Not on file    Attends religious service: Not on file    Active member of club or organization: Not on file    Attends meetings of clubs or organizations: Not on file   Relationship status: Not on file  . Intimate partner violence:    Fear of current or ex partner: Not on file    Emotionally abused: Not on file    Physically abused: Not on file    Forced sexual activity: Not on file  Other Topics Concern  . Not on file  Social History Narrative  . Not on file      Allergies as of 05/21/2018      Reactions   [name] [yohimbine] Palpitations      Medication List       Accurate as of May 21, 2018  1:50 PM. Always use your most recent med list.        atorvastatin 40 MG tablet Commonly known as:  LIPITOR TAKE 1 TABLET BY MOUTH EVERYDAY AT BEDTIME*MAX ON INSUR*   diclofenac sodium 1 % Gel Commonly known as:  VOLTAREN APPLY 4 G TOPICALLY 4 (FOUR) TIMES DAILY.   fenofibrate 160 MG tablet Take 1 tablet (160 mg total) by mouth daily.   glucose blood test strip Commonly known as:  ONETOUCH VERIO 1 each by Other route 2 (two) times daily. And lancets 2/day   insulin NPH Human 100 UNIT/ML injection Commonly known as:  HUMULIN N 140 UNITS EACH MORNING AND 100 UNITS EACH EVENING   Insulin Syringe-Needle U-100 31G X 5/16" 1 ML Misc Commonly known as:  B-D INS SYR ULTRAFINE 1CC/31G 1 strip by Other route 2 (two) times daily. And lancets 2/day   levothyroxine 100 MCG tablet Commonly known as:  SYNTHROID, LEVOTHROID TAKE 1 TABLET BY MOUTH ONCE DAILY BEFORE BREAKFAST   losartan 100 MG tablet Commonly known as:  COZAAR Take 1 tablet (100 mg total) by mouth daily.   meloxicam 7.5 MG tablet Commonly known as:  MOBIC TAKE 1 TO 2 TABLETS BY MOUTH EVERY DAY AS NEEDED           Objective:   Physical Exam BP 132/68 (BP Location: Left Arm, Patient Position: Sitting, Cuff Size: Normal)   Pulse 78   Temp 98.5 F (36.9 C) (Oral)   Resp 16   Ht 6\' 1"  (1.854 m)   Wt (!) 307 lb 4 oz (139.4 kg)   SpO2 96%   BMI 40.54 kg/m  General:   Well developed, NAD, BMI noted. HEENT:  Normocephalic . Face symmetric, atraumatic.  TMs: Bulged  bilaterally, no red.  Throat symmetric, no red. Nose congested, sinuses no TTP Lungs:  Minimal rhonchi with cough.  Otherwise normal Normal respiratory effort, no intercostal retractions, no accessory muscle use. Heart: RRR,  no murmur.  No pretibial edema bilaterally  Skin: Not pale. Not jaundice Neurologic:  alert & oriented X3.  Speech normal, gait appropriate for age and unassisted Psych--  Cognition and judgment appear intact.  Cooperative with normal attention span and concentration.  Behavior appropriate. No anxious or depressed appearing.      Assessment     63 year old gentleman, PMH includes DM, high cholesterol, HTN, on insulin, OSA on cpap presents with  URI/viral syndrome: Recommend supportive treatment with Tylenol, Mucinex, Flonase, Astelin.  If not better start amoxicillin.  See AVS

## 2018-05-26 ENCOUNTER — Other Ambulatory Visit: Payer: Self-pay | Admitting: Family Medicine

## 2018-05-26 DIAGNOSIS — E890 Postprocedural hypothyroidism: Secondary | ICD-10-CM

## 2018-06-05 ENCOUNTER — Ambulatory Visit: Payer: 59 | Admitting: Internal Medicine

## 2018-06-07 ENCOUNTER — Encounter: Payer: 59 | Admitting: Family Medicine

## 2018-06-07 ENCOUNTER — Ambulatory Visit: Payer: Self-pay | Admitting: *Deleted

## 2018-06-07 NOTE — Telephone Encounter (Signed)
Contacted pt regarding symptoms; he states that he had a cold for a month (chest congestion, sinus congestion, and chills),bilateral ear congestion for 3 weeks, and left ear pain for a week; he rates his ear pain at 6+ especially at night; tylenol has not helped; he was seen in the office 05/21/2018;   recommendations made per nurse triage protocol; he verbalizes understanding and states that he will go to ED tonight; because his wife had a mastectomy on 06/06/18 and remains in the hospital; will route to the office for notification of this encounter.   Reason for Disposition . [1] SEVERE pain AND [2] not improved 2 hours after taking analgesic medication (e.g., ibuprofen or acetaminophen)  Answer Assessment - Initial Assessment Questions 1. LOCATION: "Which ear is involved?"     Left ear 2. ONSET: "When did the ear start hurting"      05/31/2018 3. SEVERITY: "How bad is the pain?"  (Scale 1-10; mild, moderate or severe)   - MILD (1-3): doesn't interfere with normal activities    - MODERATE (4-7): interferes with normal activities or awakens from sleep    - SEVERE (8-10): excruciating pain, unable to do any normal activities      Moderate to severe 4. URI SYMPTOMS: " Do you have a runny nose or cough?"     Chest and sinus congestion, bilateral ear congestion,  5. FEVER: "Do you have a fever?" If so, ask: "What is your temperature, how was it measured, and when did it start?"     no 6. CAUSE: "Have you been swimming recently?", "How often do you use Q-TIPS?", "Have you had any recent air travel or scuba diving?"     no 7. OTHER SYMPTOMS: "Do you have any other symptoms?" (e.g., headache, stiff neck, dizziness, vomiting, runny nose, decreased hearing)     Sinus pressure under eyes, bilateral ear congestion 8. PREGNANCY: "Is there any chance you are pregnant?" "When was your last menstrual period?"     n/a  Protocols used: EARACHE-A-AH

## 2018-06-07 NOTE — Telephone Encounter (Signed)
Attempted to contact pt; left message on voicemail [phone].

## 2018-06-07 NOTE — Telephone Encounter (Signed)
Advised patient he can call a little later to get scheduled for Saturday clinic in stead of ER

## 2018-06-07 NOTE — Telephone Encounter (Signed)
He could be seen in sat clinic instead of going to er if he like

## 2018-06-08 ENCOUNTER — Ambulatory Visit: Payer: 59 | Admitting: Family Medicine

## 2018-06-08 ENCOUNTER — Encounter: Payer: Self-pay | Admitting: Family Medicine

## 2018-06-08 DIAGNOSIS — H6982 Other specified disorders of Eustachian tube, left ear: Secondary | ICD-10-CM | POA: Diagnosis not present

## 2018-06-08 DIAGNOSIS — H6980 Other specified disorders of Eustachian tube, unspecified ear: Secondary | ICD-10-CM | POA: Insufficient documentation

## 2018-06-08 MED ORDER — FLUTICASONE PROPIONATE 50 MCG/ACT NA SUSP: 2.0000 | Freq: Every day | NASAL | Status: DC

## 2018-06-08 NOTE — Patient Instructions (Addendum)
Keep using flonase daily, use nasal saline later in the day.  Gently try to pop you ears.  Update your regular doc if this doesn't improve in the next week.  Take care.  Glad to see you.

## 2018-06-08 NOTE — Progress Notes (Signed)
Sx x 4 weeks ago with a cold.  Prev cough, sneezing, sinus pressure, etc.  All sx got better except for L ear pain.  No R ear sx.  No vomiting, no diarrhea.  L ear pain is constant.  Muffled hearing L side.  No facial pain.    He has flonase to use at home.   Meds, vitals, and allergies reviewed.   ROS: Per HPI unless specifically indicated in ROS section   GEN: nad, alert and oriented HEENT: mucous membranes moist, tm w/o erythema, nasal exam w/o erythema, OP wnl NECK: supple w/o LA CV: rrr.   PULM: ctab, no inc wob EXT: no edema Sinuses not ttp  ETD on valsalva

## 2018-06-08 NOTE — Assessment & Plan Note (Signed)
Whisper hearing intact B.  Gently perform valsalva, use flonase, use nasal saline.  F/u prn.  No sign of AOM, etc.  D/w pt about plan and anatomay.

## 2018-06-12 ENCOUNTER — Encounter: Payer: Self-pay | Admitting: Family Medicine

## 2018-06-13 ENCOUNTER — Ambulatory Visit: Payer: Self-pay

## 2018-06-13 ENCOUNTER — Encounter: Payer: Self-pay | Admitting: Family Medicine

## 2018-06-13 ENCOUNTER — Telehealth: Payer: Self-pay

## 2018-06-13 ENCOUNTER — Ambulatory Visit: Payer: 59 | Admitting: Family Medicine

## 2018-06-13 ENCOUNTER — Ambulatory Visit (INDEPENDENT_AMBULATORY_CARE_PROVIDER_SITE_OTHER)
Admission: RE | Admit: 2018-06-13 | Discharge: 2018-06-13 | Disposition: A | Discharge status: Home / Self Care | Payer: 59 | Source: Ambulatory Visit | Attending: Family Medicine | Admitting: Family Medicine

## 2018-06-13 VITALS — BP 140/70 | HR 96 | Temp 100.5°F | Ht 73.0 in | Wt 308.1 lb

## 2018-06-13 DIAGNOSIS — J209 Acute bronchitis, unspecified: Secondary | ICD-10-CM

## 2018-06-13 DIAGNOSIS — R05 Cough: Secondary | ICD-10-CM | POA: Diagnosis not present

## 2018-06-13 MED ORDER — DOXYCYCLINE HYCLATE 100 MG PO TABS: 100.0000 mg | ORAL_TABLET | Freq: Two times a day (BID) | ORAL | 0 refills | Status: DC

## 2018-06-13 MED ORDER — ALBUTEROL SULFATE HFA 108 (90 BASE) MCG/ACT IN AERS: 2.0000 | INHALATION_SPRAY | Freq: Four times a day (QID) | RESPIRATORY_TRACT | 0 refills | Status: DC | PRN

## 2018-06-13 NOTE — Telephone Encounter (Signed)
Attempted to call opened triage encounter Left VM to return call to office.

## 2018-06-13 NOTE — Telephone Encounter (Signed)
Mychart message sent to [facility] triage.

## 2018-06-13 NOTE — Telephone Encounter (Signed)
Attempted to contact patient. Left VM to return call to office. 

## 2018-06-13 NOTE — Patient Instructions (Addendum)
Please go to X-ray before you leave today.  Tylenol can be used for symptoms. Please do not exceed dose on package.  An inhaler has been provided to use if needed.  An antibiotic has been provided, please take with food as directed.  Please drink plenty of water so that your urine is pale yellow or clear. Also, get plenty of rest, use tylenol  as needed for discomfort and follow up if symptoms do not improve in 3 to 4 days, worsen, or you develop a fever >101.  If symptoms do not improve with treatment, worsen, or new symptoms develop follow up for further evaluation.   Acute Bronchitis, Adult Acute bronchitis is when air tubes (bronchi) in the lungs suddenly get swollen. The condition can make it hard to breathe. It can also cause these symptoms:  A cough.  Coughing up clear, yellow, or green mucus.  Wheezing.  Chest congestion.  Shortness of breath.  A fever.  Body aches.  Chills.  A sore throat. Follow these instructions at home:  Medicines  Take over-the-counter and prescription medicines only as told by your doctor.  If you were prescribed an antibiotic medicine, take it as told by your doctor. Do not stop taking the antibiotic even if you start to feel better. General instructions  Rest.  Drink enough fluids to keep your pee (urine) pale yellow.  Avoid smoking and secondhand smoke. If you smoke and you need help quitting, ask your doctor. Quitting will help your lungs heal faster.  Use an inhaler, cool mist vaporizer, or humidifier as told by your doctor.  Keep all follow-up visits as told by your doctor. This is important. How is this prevented? To lower your risk of getting this condition again:  Wash your hands often with soap and water. If you cannot use soap and water, use hand sanitizer.  Avoid contact with people who have cold symptoms.  Try not to touch your hands to your mouth, nose, or eyes.  Make sure to get the flu shot every  year. Contact a doctor if:  Your symptoms do not get better in 2 weeks. Get help right away if:  You cough up blood.  You have chest pain.  You have very bad shortness of breath.  You become dehydrated.  You faint (pass out) or keep feeling like you are going to pass out.  You keep throwing up (vomiting).  You have a very bad headache.  Your fever or chills gets worse. This information is not intended to replace advice given to you by your health care provider. Make sure you discuss any questions you have with your health care provider. Document Released: 09/20/2007 Document Revised: 11/15/2016 Document Reviewed: 09/22/2015 Elsevier Interactive Patient Education  2019 [employer].

## 2018-06-13 NOTE — Telephone Encounter (Signed)
Message   DR. [name], I can not begin to tell you how bad I feel right. I started having chills last night and they continued through the morining. I had to turn the thermostat in my office up to 87 just to feel somewhat comfortable with a jacket on. The chills began in the center of my back then spread to my extremities. I have been sick for over a month with one virus or another. The problem with my ears seems to have subsided for the most most part. I have taken amoxicillan and every other kind of over the counter I can think of. My 0930 appoint was canelled last week because the office did not open until ten.   I saw the Dr. on [place] last Saturday and he did not seem to believe anything was wrong with me. Please help me or refer me to a Dr. that might have a little more time. I am going to buy a thermometer as soon as I finish this note. My wife just surgery and I can not make her sick. 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   Message   At 950pm my temp was 102.8   Please call Pt and triage, sounds like he needs a visit. Thank you.

## 2018-06-13 NOTE — Telephone Encounter (Signed)
Mychart message sent to [facility] triage.

## 2018-06-13 NOTE — Progress Notes (Signed)
Patient ID: Alex Rocha, male   DOB: 04/12/1956, 63 y.o.   MRN: [id number]  PCP: Carollee Herter, Alferd Apa, DO  Subjective:  Alex Rocha is a 63 y.o. year old very pleasant male patient who presents with symptoms including sore throat, cough that is productive of green sputum, mild SOB, headahce, body aches, nasal congestion, and left ear pain Associated chills and fever have been present -started: chills/fever started approximately 2 days ago ago per patient , symptoms are not improving. Symptoms of nasal congestion and cough with green sputum have been present for approximately 4 weeks.  -previous treatments: "Every OTC cold/flu medications" He also completed a course of amoxicillin that he started approximately 05/28/2018. He states that no treatment has provided benefit. -sick contacts/travel/risks: denies flu exposure.  He was evaluated on 05/21/2018 for similar symptoms and was treated for viral URI. He was advised if no improvement, he was provided amoxicillin to take which he started approximately 05/28/2018 and completed full course of antibiotics.  He was evaluated again on 06/08/2018 and treated for dysfunction of left eustachian tube. He was provided flonase which he is continuing to use.   Influenza vaccine is UTD He does not smoke No recent sick contact exposure.   ROS-denies significant SOB, NVD, tooth pain  Pertinent Past Medical History- HTN, DM, GERD  Medications- reviewed  Current Outpatient Medications  Medication Sig Dispense Refill  . atorvastatin (LIPITOR) 40 MG tablet TAKE 1 TABLET BY MOUTH EVERYDAY AT BEDTIME*MAX ON INSUR* 90 tablet 1  . azelastine (ASTELIN) 0.1 % nasal spray Place 2 sprays into both nostrils at bedtime as needed for rhinitis. Use in each nostril as directed 30 mL 3  . diclofenac sodium (VOLTAREN) 1 % GEL APPLY 4 G TOPICALLY 4 (FOUR) TIMES DAILY. 100 g 1  . fenofibrate 160 MG tablet Take 1 tablet (160 mg total) by mouth daily. 90 tablet 1  .  fluticasone (FLONASE) 50 MCG/ACT nasal spray Place 2 sprays into both nostrils daily.    Marland Kitchen glucose blood (ONETOUCH VERIO) test strip 1 each by Other route 2 (two) times daily. And lancets 2/day 180 each 3  . insulin NPH Human (HUMULIN N) 100 UNIT/ML injection 140 UNITS EACH MORNING AND 100 UNITS EACH EVENING 80 mL 11  . Insulin Syringe-Needle U-100 (B-D INS SYR ULTRAFINE 1CC/31G) 31G X 5/16" 1 ML MISC 1 strip by Other route 2 (two) times daily. And lancets 2/day 200 each 3  . levothyroxine (SYNTHROID, LEVOTHROID) 100 MCG tablet TAKE 1 TABLET BY MOUTH ONCE DAILY BEFORE BREAKFAST 30 tablet 5  . losartan (COZAAR) 100 MG tablet Take 1 tablet (100 mg total) by mouth daily. 90 tablet 1  . meloxicam (MOBIC) 7.5 MG tablet TAKE 1 TO 2 TABLETS BY MOUTH EVERY DAY AS NEEDED 60 tablet 1   No current facility-administered medications for this visit.     Objective: BP 140/70 (BP Location: Left Arm, Patient Position: Sitting, Cuff Size: Large)   Pulse 96   Temp (!) 100.5 F (38.1 C) (Oral)   Ht 6\' 1"  (1.854 m)   Wt (!) 308 lb 1.9 oz (139.8 kg)   SpO2 94%   BMI 40.65 kg/m  Gen: NAD,  Obese male, resting comfortably HEENT: Turbinates erythematous, TMs normal bilaterally , pharynx mildly erythematous with no tonsilar exudate or edema, no sinus tenderness CV: RRR no murmurs rubs or gallops Lungs: CTAB no crackles, wheeze, minimal rhonchi with cough, normal respiratory effort, no stridor or accessory muscle use Ext: no pretibial  edema bilaterally  Skin: warm, dry, no rash Neuro: grossly normal, moves all extremities  Assessment/Plan: 1. Acute bronchitis, unspecified organism POC influenza and strep are both negative today. Patient's symptoms most consistent with bronchitis. Discussed likely ? viral nature and less likely bacterial cause. Benign lung exam with exception of minimal rhonchi with cough, fever present, otherwise VSS which make pneumonia unlikely. History of DM uncontrolled present, HTN, GERD,  and obesity present. Duration of symptoms greater than 2 weeks. Discussed possible treatment options and will treat with doxycycline, provided albuterol to use if needed and obtain a chest X-ray to rule out pneumonia. He was not interested in treatment for his cough, nor prednisone taper as this does increase blood sugar and he denies significant SOB.   Advised close follow up and further advised if symptoms do not improve in the next 2 to 3 days, worsen, or new symptoms develop, he should seek medical attention. Return precautions provided.   - DG Chest 2 View; Future - doxycycline (VIBRA-TABS) 100 MG tablet; Take 1 tablet (100 mg total) by mouth 2 (two) times daily.  Dispense: 20 tablet; Refill: 0 - albuterol (PROVENTIL HFA;VENTOLIN HFA) 108 (90 Base) MCG/ACT inhaler; Inhale 2 puffs into the lungs every 6 (six) hours as needed for wheezing or shortness of breath.  Dispense: 1 Inhaler; Refill: 0   Finally, we reviewed reasons to return to care including if symptoms worsen or persist or new concerns arise- once again particularly shortness of breath or fever.   [name], FNP

## 2018-06-13 NOTE — Telephone Encounter (Signed)
Appt scheduled today at [facility] at 3:30p.

## 2018-06-17 ENCOUNTER — Other Ambulatory Visit: Payer: Self-pay | Admitting: Family Medicine

## 2018-06-17 DIAGNOSIS — M5442 Lumbago with sciatica, left side: Secondary | ICD-10-CM

## 2018-06-18 ENCOUNTER — Ambulatory Visit: Payer: 59 | Admitting: Family Medicine

## 2018-06-18 ENCOUNTER — Encounter: Payer: Self-pay | Admitting: Family Medicine

## 2018-06-18 VITALS — BP 142/86 | HR 64 | Temp 98.1°F | Ht 73.0 in | Wt 304.0 lb

## 2018-06-18 DIAGNOSIS — J0140 Acute pansinusitis, unspecified: Secondary | ICD-10-CM

## 2018-06-18 MED ORDER — PREDNISONE 10 MG PO TABS: ORAL_TABLET | ORAL | 0 refills | Status: DC

## 2018-06-18 MED ORDER — LEVOFLOXACIN 500 MG PO TABS: 500.0000 mg | ORAL_TABLET | Freq: Every day | ORAL | 0 refills | Status: DC

## 2018-06-18 MED ORDER — METHYLPREDNISOLONE ACETATE 80 MG/ML IJ SUSP
80.0000 mg | Freq: Once | INTRAMUSCULAR | Status: AC
  Administered 2018-06-18: 80 mg via INTRAMUSCULAR

## 2018-06-18 NOTE — Progress Notes (Signed)
Subjective:    Patient ID: Alex Rocha, male    DOB: 11-07-1955, 63 y.o.   MRN: [id number]  HPI  Patient here for f/u congestion , fever etc.  Pt has been to office and sat clinic 3 x before today.  He is only slightly better.  See last 3 visits.  He has been on abx --see med list  No more fevers  Past Medical History:  Diagnosis Date  . Colon polyps   . Diabetes mellitus   . Environmental allergies   . Hyperlipidemia   . Hypertension   . Hyperthyroidism   . Kidney stones   . Low testosterone     Review of Systems  Constitutional: Negative for chills and fever.  HENT: Positive for congestion, ear pain, postnasal drip, rhinorrhea and sinus pressure.   Respiratory: Positive for cough. Negative for chest tightness, shortness of breath and wheezing.   Cardiovascular: Negative for chest pain, palpitations and leg swelling.  Allergic/Immunologic: Negative for environmental allergies.       Objective:    Physical Exam Vitals signs and nursing note reviewed.  Constitutional:      Appearance: He is well-developed.  HENT:     Right Ear: Tympanic membrane, ear canal and external ear normal.     Left Ear: Tympanic membrane, ear canal and external ear normal.     Nose:     Right Turbinates: Swollen.     Left Turbinates: Swollen.     Right Sinus: Maxillary sinus tenderness and frontal sinus tenderness present.     Left Sinus: Maxillary sinus tenderness and frontal sinus tenderness present.     Mouth/Throat:     Pharynx: Posterior oropharyngeal erythema present.     Tonsils: No tonsillar exudate.  Eyes:     General:        Right eye: No discharge.        Left eye: No discharge.     Conjunctiva/sclera: Conjunctivae normal.  Cardiovascular:     Rate and Rhythm: Normal rate and regular rhythm.     Heart sounds: Normal heart sounds. No murmur.  Pulmonary:     Effort: Pulmonary effort is normal. No respiratory distress.     Breath sounds: Normal breath sounds. No wheezing  or rales.  Chest:     Chest wall: No tenderness.  Lymphadenopathy:     Cervical: Cervical adenopathy present.  Neurological:     Mental Status: He is alert and oriented to person, place, and time.     BP (!) 142/86   Pulse 64   Temp 98.1 F (36.7 C)   Ht 6\' 1"  (1.854 m)   Wt (!) 304 lb (137.9 kg)   SpO2 97%   BMI 40.11 kg/m  Wt Readings from Last 3 Encounters:  06/18/18 (!) 304 lb (137.9 kg)  06/13/18 (!) 308 lb 1.9 oz (139.8 kg)  06/08/18 (!) 305 lb 8 oz (138.6 kg)     Lab Results  Component Value Date   WBC 6.7 12/29/2014   HGB 13.8 12/29/2014   HCT 41.3 12/29/2014   PLT 206 12/29/2014   GLUCOSE 146 (H) 12/04/2017   CHOL 124 12/04/2017   TRIG 222.0 (H) 12/04/2017   HDL 23.90 (L) 12/04/2017   LDLDIRECT 73.0 12/04/2017   LDLCALC 68 05/22/2017   ALT 32 12/04/2017   AST 28 12/04/2017   NA 141 12/04/2017   K 5.1 12/04/2017   CL 108 12/04/2017   CREATININE 1.79 (H) 12/04/2017   BUN 43 (H)  12/04/2017   CO2 26 12/04/2017   TSH 1.58 12/04/2017   HGBA1C 8.4 (A) 05/06/2018   MICROALBUR 1.4 05/22/2017    Dg Chest 2 View  Result Date: 06/14/2018 CLINICAL DATA:  63 year old male with fever chills and cough EXAM: CHEST - 2 VIEW COMPARISON:  06/13/2013 FINDINGS: Cardiomediastinal silhouette unchanged. No evidence of central vascular congestion. No pneumothorax or pleural effusion. No confluent airspace disease. No pleural effusion. No pneumothorax. No displaced fracture.  Degenerative changes of the spine IMPRESSION: Negative for acute cardiopulmonary disease Electronically Signed   By: Corrie Mckusick D.O.   On: 06/14/2018 10:56       Assessment & Plan:   Problem List Items Addressed This Visit      Unprioritized   Sinusitis - Primary   Relevant Medications   levofloxacin (LEVAQUIN) 500 MG tablet   predniSONE (DELTASONE) 10 MG tablet   methylPREDNISolone acetate (DEPO-MEDROL) injection 80 mg (Completed)    con't flonase and cough med con't use coricidin hbp rto  prn   Ann Held, DO

## 2018-06-18 NOTE — Patient Instructions (Signed)
Sinusitis, Adult  Sinusitis is inflammation of your sinuses. Sinuses are hollow spaces in the bones around your face. Your sinuses are located:   Around your eyes.   In the middle of your forehead.   Behind your nose.   In your cheekbones.  Mucus normally drains out of your sinuses. When your nasal tissues become inflamed or swollen, mucus can become trapped or blocked. This allows bacteria, viruses, and fungi to grow, which leads to infection. Most infections of the sinuses are caused by a virus.  Sinusitis can develop quickly. It can last for up to 4 weeks (acute) or for more than 12 weeks (chronic). Sinusitis often develops after a cold.  What are the causes?  This condition is caused by anything that creates swelling in the sinuses or stops mucus from draining. This includes:   Allergies.   Asthma.   Infection from bacteria or viruses.   Deformities or blockages in your nose or sinuses.   Abnormal growths in the nose (nasal polyps).   Pollutants, such as chemicals or irritants in the air.   Infection from fungi (rare).  What increases the risk?  You are more likely to develop this condition if you:   Have a weak body defense system (immune system).   Do a lot of swimming or diving.   Overuse nasal sprays.   Smoke.  What are the signs or symptoms?  The main symptoms of this condition are pain and a feeling of pressure around the affected sinuses. Other symptoms include:   Stuffy nose or congestion.   Thick drainage from your nose.   Swelling and warmth over the affected sinuses.   Headache.   Upper toothache.   A cough that may get worse at night.   Extra mucus that collects in the throat or the back of the nose (postnasal drip).   Decreased sense of smell and taste.   Fatigue.   A fever.   Sore throat.   Bad breath.  How is this diagnosed?  This condition is diagnosed based on:   Your symptoms.   Your medical history.   A physical exam.   Tests to find out if your condition is  acute or chronic. This may include:  ? Checking your nose for nasal polyps.  ? Viewing your sinuses using a device that has a light (endoscope).  ? Testing for allergies or bacteria.  ? Imaging tests, such as an MRI or CT scan.  In rare cases, a bone biopsy may be done to rule out more serious types of fungal sinus disease.  How is this treated?  Treatment for sinusitis depends on the cause and whether your condition is chronic or acute.   If caused by a virus, your symptoms should go away on their own within 10 days. You may be given medicines to relieve symptoms. They include:  ? Medicines that shrink swollen nasal passages (topical intranasal decongestants).  ? Medicines that treat allergies (antihistamines).  ? A spray that eases inflammation of the nostrils (topical intranasal corticosteroids).  ? Rinses that help get rid of thick mucus in your nose (nasal saline washes).   If caused by bacteria, your health care provider may recommend waiting to see if your symptoms improve. Most bacterial infections will get better without antibiotic medicine. You may be given antibiotics if you have:  ? A severe infection.  ? A weak immune system.   If caused by narrow nasal passages or nasal polyps, you may need   to have surgery.  Follow these instructions at home:  Medicines   Take, use, or apply over-the-counter and prescription medicines only as told by your health care provider. These may include nasal sprays.   If you were prescribed an antibiotic medicine, take it as told by your health care provider. Do not stop taking the antibiotic even if you start to feel better.  Hydrate and humidify     Drink enough fluid to keep your urine pale yellow. Staying hydrated will help to thin your mucus.   Use a cool mist humidifier to keep the humidity level in your home above 50%.   Inhale steam for 10-15 minutes, 3-4 times a day, or as told by your health care provider. You can do this in the bathroom while a hot shower is  running.   Limit your exposure to cool or dry air.  Rest   Rest as much as possible.   Sleep with your head raised (elevated).   Make sure you get enough sleep each night.  General instructions     Apply a warm, moist washcloth to your face 3-4 times a day or as told by your health care provider. This will help with discomfort.   Wash your hands often with soap and water to reduce your exposure to germs. If soap and water are not available, use hand sanitizer.   Do not smoke. Avoid being around people who are smoking (secondhand smoke).   Keep all follow-up visits as told by your health care provider. This is important.  Contact a health care provider if:   You have a fever.   Your symptoms get worse.   Your symptoms do not improve within 10 days.  Get help right away if:   You have a severe headache.   You have persistent vomiting.   You have severe pain or swelling around your face or eyes.   You have vision problems.   You develop confusion.   Your neck is stiff.   You have trouble breathing.  Summary   Sinusitis is soreness and inflammation of your sinuses. Sinuses are hollow spaces in the bones around your face.   This condition is caused by nasal tissues that become inflamed or swollen. The swelling traps or blocks the flow of mucus. This allows bacteria, viruses, and fungi to grow, which leads to infection.   If you were prescribed an antibiotic medicine, take it as told by your health care provider. Do not stop taking the antibiotic even if you start to feel better.   Keep all follow-up visits as told by your health care provider. This is important.  This information is not intended to replace advice given to you by your health care provider. Make sure you discuss any questions you have with your health care provider.  Document Released: 04/03/2005 Document Revised: 09/03/2017 Document Reviewed: 09/03/2017  Elsevier Interactive Patient Education  2019 [employer]

## 2018-06-21 ENCOUNTER — Telehealth: Payer: Self-pay

## 2018-06-21 DIAGNOSIS — H9202 Otalgia, left ear: Secondary | ICD-10-CM

## 2018-06-21 NOTE — Telephone Encounter (Signed)
Did not see a note in chart about left ear pain.  Are you ok with this?

## 2018-06-21 NOTE — Telephone Encounter (Signed)
Copied from [place] ([phone] [id number]. Topic: Referral - Request for Referral >> Jun 21, 2018  8:29 AM Lennox Solders wrote: Has patient seen PCP for this complaint? Yes. Pt is calling and would like to proceed with ENT referral for left ear pain. Pt is requesting Monday, Tuesday, Wednesday am appointment. Pt has [facility]. Pt saw dr Carollee Herter on 06-18-2018

## 2018-06-21 NOTE — Telephone Encounter (Signed)
yes

## 2018-06-25 NOTE — Addendum Note (Signed)
Addended by: Kem Boroughs D on: 06/25/2018 09:03 AM   Modules accepted: Orders

## 2018-06-25 NOTE — Telephone Encounter (Signed)
Patient notified that referral has been placed  

## 2018-07-01 ENCOUNTER — Other Ambulatory Visit: Payer: Self-pay

## 2018-07-01 ENCOUNTER — Telehealth: Payer: Self-pay | Admitting: Endocrinology

## 2018-07-01 ENCOUNTER — Ambulatory Visit (INDEPENDENT_AMBULATORY_CARE_PROVIDER_SITE_OTHER): Payer: 59 | Admitting: Endocrinology

## 2018-07-01 ENCOUNTER — Encounter: Payer: Self-pay | Admitting: Endocrinology

## 2018-07-01 VITALS — BP 124/70 | HR 78 | Ht 73.0 in

## 2018-07-01 DIAGNOSIS — E119 Type 2 diabetes mellitus without complications: Secondary | ICD-10-CM

## 2018-07-01 DIAGNOSIS — Z794 Long term (current) use of insulin: Principal | ICD-10-CM

## 2018-07-01 LAB — POCT GLYCOSYLATED HEMOGLOBIN (HGB A1C): Hemoglobin A1C: 7.4 % — AB (ref 4.0–5.6)

## 2018-07-01 MED ORDER — GLUCOSE BLOOD VI STRP: 1.0000 | ORAL_STRIP | Freq: Two times a day (BID) | 11 refills | Status: DC

## 2018-07-01 NOTE — Patient Instructions (Addendum)
Please continue the same insulin check your blood sugar twice a day.  vary the time of day when you check, between before the 3 meals, and at bedtime.  also check if you have symptoms of your blood sugar being too high or too low.  please keep a record of the readings and bring it to your next appointment here.  You can write it on any piece of paper.  please call us sooner if your blood sugar goes below 70, or if you have a lot of readings over 200.   On this type of insulin schedule, you should eat meals on a regular schedule (especially lunch).  If a meal is missed or significantly delayed, your blood sugar could go low.   Please come back for a follow-up appointment in 2-3 months.

## 2018-07-01 NOTE — Telephone Encounter (Signed)
[id number] - [place], [place] - [address]  glucose blood (ONETOUCH VERIO) test strip 100 each 11 07/01/2018    Sig - Route: 1 each by Other route 2 (two) times daily. Use to monitor glucose levels BID; E11.51 - Other   Sent to pharmacy as: glucose blood (ONETOUCH VERIO) test strip   E-Prescribing Status: Receipt confirmed by pharmacy (07/01/2018 10:08 AM EDT)

## 2018-07-01 NOTE — Progress Notes (Signed)
Subjective:    Patient ID: Alex Rocha, male    DOB: [birth year], 63 y.o.   MRN: [id number]  HPI Pt returns for f/u of diabetes mellitus: DM type: Insulin-requiring type 2. Dx'ed: 2000.  Complications: polyneuropathy and renal insuff Therapy: insulin since 2014.  DKA: never.  Severe hypoglycemia: once, in 2018 Pancreatitis: never.   Other: he declines multiple daily injections; he says his need to care for his wife, who has cancer, limits his ability to care for himself; he declines weight-loss surgery; he takes human insulin, due to cost.   Interval history: no cbg record, but states cbg's vary from 68-196.  It is in general higher as the day goes on.  He has not had to reduce the insulin, as he has not recently been active.  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   Past Medical History:  Diagnosis Date  . Colon polyps   . Diabetes mellitus   . Environmental allergies   . Hyperlipidemia   . Hypertension   . Hyperthyroidism   . Kidney stones   . Low testosterone     No past surgical history on file.  Social History   Socioeconomic History  . Marital status: Married    Spouse name: Not on file  . Number of children: 2  . Years of education: Not on file  . Highest education level: Not on file  Occupational History  . Occupation: Nurse, adult  Social Needs  . Financial resource strain: Not on file  . Food insecurity:    Worry: Not on file    Inability: Not on file  . Transportation needs:    Medical: Not on file    Non-medical: Not on file  Tobacco Use  . Smoking status: Never Smoker  . Smokeless tobacco: Never Used  Substance and Sexual Activity  . Alcohol use: Yes    Alcohol/week: 0.0 standard drinks    Comment: social  . Drug use: No  . Sexual activity: Not on file  Lifestyle  . Physical  activity:    Days per week: Not on file    Minutes per session: Not on file  . Stress: Not on file  Relationships  . Social connections:    Talks on phone: Not on file    Gets together: Not on file    Attends religious service: Not on file    Active member of club or organization: Not on file    Attends meetings of clubs or organizations: Not on file    Relationship status: Not on file  . Intimate partner violence:    Fear of current or ex partner: Not on file    Emotionally abused: Not on file    Physically abused: Not on file    Forced sexual activity: Not on file  Other Topics Concern  . Not on file  Social History Narrative  . Not on file    Current Outpatient Medications on File Prior to Visit  Medication Sig Dispense Refill  . albuterol (PROVENTIL HFA;VENTOLIN HFA) 108 (90 Base) MCG/ACT inhaler Inhale 2 puffs into the lungs every 6 (six) hours as needed for wheezing or shortness of breath. 1 Inhaler 0  . atorvastatin (LIPITOR) 40 MG tablet TAKE 1 TABLET BY MOUTH EVERYDAY AT BEDTIME*MAX ON INSUR* 90 tablet 1  . azelastine (ASTELIN)  0.1 % nasal spray Place 2 sprays into both nostrils at bedtime as needed for rhinitis. Use in each nostril as directed 30 mL 3  . diclofenac sodium (VOLTAREN) 1 % GEL APPLY 4 G TOPICALLY 4 (FOUR) TIMES DAILY. 100 g 1  . doxycycline (VIBRA-TABS) 100 MG tablet Take 1 tablet (100 mg total) by mouth 2 (two) times daily. 20 tablet 0  . fenofibrate 160 MG tablet Take 1 tablet (160 mg total) by mouth daily. 90 tablet 1  . fluticasone (FLONASE) 50 MCG/ACT nasal spray Place 2 sprays into both nostrils daily.    . insulin NPH Human (HUMULIN N) 100 UNIT/ML injection 140 UNITS EACH MORNING AND 100 UNITS EACH EVENING 80 mL 11  . Insulin Syringe-Needle U-100 (B-D INS SYR ULTRAFINE 1CC/31G) 31G X 5/16" 1 ML MISC 1 strip by Other route 2 (two) times daily. And lancets 2/day 200 each 3  . levofloxacin (LEVAQUIN) 500 MG tablet Take 1 tablet (500 mg total) by mouth  daily. 10 tablet 0  . levothyroxine (SYNTHROID, LEVOTHROID) 100 MCG tablet TAKE 1 TABLET BY MOUTH ONCE DAILY BEFORE BREAKFAST 30 tablet 5  . losartan (COZAAR) 100 MG tablet Take 1 tablet (100 mg total) by mouth daily. 90 tablet 1  . meloxicam (MOBIC) 7.5 MG tablet TAKE 1 TO 2 TABLETS BY MOUTH EVERY DAY AS NEEDED 60 tablet 1   No current facility-administered medications on file prior to visit.     Allergies  Allergen Reactions  . [name] [Yohimbine] Palpitations    Family History  Problem Relation Age of Onset  . Colon cancer Mother   . Heart disease Mother   . Hypertension Mother        Mother's side of the family  . Diabetes Mother        Mother's side of the family  . Colon cancer Brother   . Hyperlipidemia Brother        Mother's side of the family  . Heart disease Sister   . Diabetes Other        father's family    BP 124/70 (BP Location: Left Arm, Patient Position: Sitting, Cuff Size: Large)   Pulse 78   Ht 6\' 1"  (1.854 m)   SpO2 95%   BMI 40.11 kg/m    Review of Systems Denies LOC    Objective:   Physical Exam VITAL SIGNS:  See vs page GENERAL: no distress Pulses: foot pulses are intact bilaterally.   MSK: no deformity of the feet or ankles.  CV: 1+ bilat leg edema of the legs. Skin:  no ulcer on the feet or ankles.  normal color and temp on the feet and ankles.  Neuro: sensation is intact to touch on the feet and ankles, but decreased from normal.  Ext: There is bilateral onychomycosis of the toenails.    A1c=7.4%     Assessment & Plan:  AB, new to me Insulin-requiring type 2 DM, with renal insuff: this is the best control this pt should aim for, given this regimen, which does match insulin to his changing needs throughout the day.    Patient Instructions  Please continue the same insulin check your blood sugar twice a day.  vary the time of day when you check, between before the 3 meals, and at bedtime.  also check if you have symptoms of your  blood sugar being too high or too low.  please keep a record of the readings and bring it to your next appointment here.  You can write it on any piece of paper.  please call us sooner if your blood sugar goes below 70, or if you have a lot of readings over 200.   On this type of insulin schedule, you should eat meals on a regular schedule (especially lunch).  If a meal is missed or significantly delayed, your blood sugar could go low.   Please come back for a follow-up appointment in 2-3 months.

## 2018-07-01 NOTE — Telephone Encounter (Signed)
Patient called re: patient wants to know the address of the [facility] Dr. Loanne Drilling sent his RX for test strips to. Please call patient at ph# [phone] to advise.

## 2018-07-05 ENCOUNTER — Ambulatory Visit: Payer: 59 | Admitting: Family Medicine

## 2018-07-05 ENCOUNTER — Telehealth: Payer: Self-pay

## 2018-07-05 ENCOUNTER — Other Ambulatory Visit: Payer: Self-pay

## 2018-07-05 ENCOUNTER — Other Ambulatory Visit: Payer: Self-pay | Admitting: Family Medicine

## 2018-07-05 VITALS — BP 122/68 | HR 108 | Temp 99.2°F | Resp 16 | Ht 73.0 in | Wt 301.8 lb

## 2018-07-05 DIAGNOSIS — R6883 Chills (without fever): Secondary | ICD-10-CM | POA: Diagnosis not present

## 2018-07-05 DIAGNOSIS — R509 Fever, unspecified: Secondary | ICD-10-CM | POA: Diagnosis not present

## 2018-07-05 NOTE — Telephone Encounter (Signed)
Pt on his way to office

## 2018-07-05 NOTE — Patient Instructions (Signed)
Coronavirus (COVID-19) Are you at risk?  Are you at risk for the Coronavirus (COVID-19)?  To be considered HIGH RISK for Coronavirus (COVID-19), you have to meet the following criteria:  . Traveled to [place], [place], [place], [place] or [place]; or in the [place] to [place], [place], [place], or [place]; and have fever, cough, and shortness of breath within the last 2 weeks of travel OR . Been in close contact with a person diagnosed with COVID-19 within the last 2 weeks and have fever, cough, and shortness of breath . IF YOU DO NOT MEET THESE CRITERIA, YOU ARE CONSIDERED LOW RISK FOR COVID-19.  What to do if you are HIGH RISK for COVID-19?  . If you are having a medical emergency, call 911. . Seek medical care right away. Before you go to a doctor's office, urgent care or emergency department, call ahead and tell them about your recent travel, contact with someone diagnosed with COVID-19, and your symptoms. You should receive instructions from your physician's office regarding next steps of care.  . When you arrive at healthcare provider, tell the healthcare staff immediately you have returned from visiting [place], [place], [place], [place] or [place]; or traveled in the [place] to [place], [place], [place], or [place]; in the last two weeks or you have been in close contact with a person diagnosed with COVID-19 in the last 2 weeks.   . Tell the health care staff about your symptoms: fever, cough and shortness of breath. . After you have been seen by a medical provider, you will be either: o Tested for (COVID-19) and discharged home on quarantine except to seek medical care if symptoms worsen, and asked to  - Stay home and avoid contact with others until you get your results (4-5 days)  - Avoid travel on public transportation if possible (such as bus, train, or airplane) or o Sent to the Emergency Department by EMS for evaluation, COVID-19 testing, and possible  admission depending on your condition and test results.  What to do if you are LOW RISK for COVID-19?  Reduce your risk of any infection by using the same precautions used for avoiding the common cold or flu:  . Wash your hands often with soap and warm water for at least 20 seconds.  If soap and water are not readily available, use an alcohol-based hand sanitizer with at least 60% alcohol.  . If coughing or sneezing, cover your mouth and nose by coughing or sneezing into the elbow areas of your shirt or coat, into a tissue or into your sleeve (not your hands). . Avoid shaking hands with others and consider head nods or verbal greetings only. . Avoid touching your eyes, nose, or mouth with unwashed hands.  . Avoid close contact with people who are sick. . Avoid places or events with large numbers of people in one location, like concerts or sporting events. . Carefully consider travel plans you have or are making. . If you are planning any travel outside or inside the [place], visit the CDC's Travelers' Health webpage for the latest health notices. . If you have some symptoms but not all symptoms, continue to monitor at home and seek medical attention if your symptoms worsen. . If you are having a medical emergency, call 911.   ADDITIONAL HEALTHCARE OPTIONS FOR PATIENTS  [facility] / e-Visit: [email or web address]         [facility]: [phone]  [facility]: [phone]                   [facility]: [phone]   

## 2018-07-05 NOTE — Progress Notes (Signed)
Patient ID: Alex Rocha, male    DOB: 06/03/1955  Age: 63 y.o. MRN: [id number]    Subjective:  Subjective  HPI [name] presents for fever and chills, myalgia   Review of Systems  Constitutional: Positive for chills and fever. Negative for appetite change, diaphoresis, fatigue and unexpected weight change.  HENT: Positive for congestion, postnasal drip, rhinorrhea and sinus pressure.   Eyes: Negative for pain, redness and visual disturbance.  Respiratory: Positive for cough and shortness of breath. Negative for chest tightness and wheezing.   Cardiovascular: Negative for chest pain, palpitations and leg swelling.  Endocrine: Negative for cold intolerance, heat intolerance, polydipsia, polyphagia and polyuria.  Genitourinary: Negative for difficulty urinating, dysuria and frequency.  Allergic/Immunologic: Negative for environmental allergies.  Neurological: Negative for dizziness, light-headedness, numbness and headaches.    History Past Medical History:  Diagnosis Date  . Colon polyps   . Diabetes mellitus   . Environmental allergies   . Hyperlipidemia   . Hypertension   . Hyperthyroidism   . Kidney stones   . Low testosterone     He has no past surgical history on file.   His family history includes Colon cancer in his brother and mother; Diabetes in his mother and another family member; Heart disease in his mother and sister; Hyperlipidemia in his brother; Hypertension in his mother.He reports that he has never smoked. He has never used smokeless tobacco. He reports current alcohol use. He reports that he does not use drugs.  Current Outpatient Medications on File Prior to Visit  Medication Sig Dispense Refill  . albuterol (PROVENTIL HFA;VENTOLIN HFA) 108 (90 Base) MCG/ACT inhaler Inhale 2 puffs into the lungs every 6 (six) hours as needed for wheezing or shortness of breath. 1 Inhaler 0  . atorvastatin (LIPITOR) 40 MG tablet TAKE 1 TABLET BY MOUTH EVERYDAY AT  BEDTIME*MAX ON INSUR* 90 tablet 1  . azelastine (ASTELIN) 0.1 % nasal spray Place 2 sprays into both nostrils at bedtime as needed for rhinitis. Use in each nostril as directed 30 mL 3  . diclofenac sodium (VOLTAREN) 1 % GEL APPLY 4 G TOPICALLY 4 (FOUR) TIMES DAILY. 100 g 1  . fenofibrate 160 MG tablet TAKE 1 TABLET BY MOUTH EVERY DAY 30 tablet 5  . fluticasone (FLONASE) 50 MCG/ACT nasal spray Place 2 sprays into both nostrils daily.    Marland Kitchen glucose blood (ONETOUCH VERIO) test strip 1 each by Other route 2 (two) times daily. Use to monitor glucose levels BID; E11.51 100 each 11  . insulin NPH Human (HUMULIN N) 100 UNIT/ML injection 140 UNITS EACH MORNING AND 100 UNITS EACH EVENING 80 mL 11  . Insulin Syringe-Needle U-100 (B-D INS SYR ULTRAFINE 1CC/31G) 31G X 5/16" 1 ML MISC 1 strip by Other route 2 (two) times daily. And lancets 2/day 200 each 3  . levofloxacin (LEVAQUIN) 500 MG tablet Take 1 tablet (500 mg total) by mouth daily. 10 tablet 0  . levothyroxine (SYNTHROID, LEVOTHROID) 100 MCG tablet TAKE 1 TABLET BY MOUTH ONCE DAILY BEFORE BREAKFAST 30 tablet 5  . losartan (COZAAR) 100 MG tablet Take 1 tablet (100 mg total) by mouth daily. 90 tablet 1  . meloxicam (MOBIC) 7.5 MG tablet TAKE 1 TO 2 TABLETS BY MOUTH EVERY DAY AS NEEDED 60 tablet 1   No current facility-administered medications on file prior to visit.      Objective:  Objective  Physical Exam There were no vitals taken for this visit. Wt Readings from Last 3  Encounters:  06/18/18 (!) 304 lb (137.9 kg)  06/13/18 (!) 308 lb 1.9 oz (139.8 kg)  06/08/18 (!) 305 lb 8 oz (138.6 kg)     Lab Results  Component Value Date   WBC 6.7 12/29/2014   HGB 13.8 12/29/2014   HCT 41.3 12/29/2014   PLT 206 12/29/2014   GLUCOSE 146 (H) 12/04/2017   CHOL 124 12/04/2017   TRIG 222.0 (H) 12/04/2017   HDL 23.90 (L) 12/04/2017   LDLDIRECT 73.0 12/04/2017   LDLCALC 68 05/22/2017   ALT 32 12/04/2017   AST 28 12/04/2017   NA 141 12/04/2017   K  5.1 12/04/2017   CL 108 12/04/2017   CREATININE 1.79 (H) 12/04/2017   BUN 43 (H) 12/04/2017   CO2 26 12/04/2017   TSH 1.58 12/04/2017   HGBA1C 7.4 (A) 07/01/2018   MICROALBUR 1.4 05/22/2017    Dg Chest 2 View  Result Date: 06/14/2018 CLINICAL DATA:  63 year old male with fever chills and cough EXAM: CHEST - 2 VIEW COMPARISON:  06/13/2013 FINDINGS: Cardiomediastinal silhouette unchanged. No evidence of central vascular congestion. No pneumothorax or pleural effusion. No confluent airspace disease. No pleural effusion. No pneumothorax. No displaced fracture.  Degenerative changes of the spine IMPRESSION: Negative for acute cardiopulmonary disease Electronically Signed   By: Corrie Mckusick D.O.   On: 06/14/2018 10:56     Assessment & Plan:  Plan  I have discontinued Zygmunt L. Allan's doxycycline. I am also having him maintain his losartan, Insulin Syringe-Needle U-100, diclofenac sodium, atorvastatin, insulin NPH Human, azelastine, levothyroxine, fluticasone, albuterol, meloxicam, levofloxacin, glucose blood, and fenofibrate.  No orders of the defined types were placed in this encounter.   Problem List Items Addressed This Visit    None      Follow-up: No follow-ups on file.  Ann Held, DO

## 2018-07-05 NOTE — Telephone Encounter (Signed)
I have talked with patient to confirm details of symptoms listed for his Saturday appt tomorrow---patient states this has been ongoing problem since January---the best results he has had so far is after visit with Dr. Etter Sjogren with steroid medicine, however, it has not lasted---woke up yesterday starting to feel lethargic again, and then today, all symptoms listed in appt note are back: which includes lethargic,low grade fever,sore throat,chills,congestion --chest xray already performed in February confirming no pneumonia, flu test has been negative---routing to dr lowne----do you think appropriate for patient to come for labwork today (our lab office is open until 5:30pm) or do you recommend another office visit with you?----[facility] office is not open on weekends, so if labwork is appropriate, we will not be able to order labs tomorrow---please advise, I will call patient back, thanks

## 2018-07-06 ENCOUNTER — Ambulatory Visit: Payer: 59 | Admitting: Internal Medicine

## 2018-07-06 LAB — CBC WITH DIFFERENTIAL/PLATELET
Absolute Monocytes: 974 cells/uL — ABNORMAL HIGH (ref 200–950)
Basophils Absolute: 64 cells/uL (ref 0–200)
Basophils Relative: 0.6 %
Eosinophils Absolute: 86 cells/uL (ref 15–500)
Eosinophils Relative: 0.8 %
HCT: 32.4 % — ABNORMAL LOW (ref 38.5–50.0)
Hemoglobin: 10.9 g/dL — ABNORMAL LOW (ref 13.2–17.1)
Lymphs Abs: 1466 cells/uL (ref 850–3900)
MCH: 31.1 pg (ref 27.0–33.0)
MCHC: 33.6 g/dL (ref 32.0–36.0)
MCV: 92.6 fL (ref 80.0–100.0)
MPV: 10.7 fL (ref 7.5–12.5)
Monocytes Relative: 9.1 %
Neutro Abs: 8111 cells/uL — ABNORMAL HIGH (ref 1500–7800)
Neutrophils Relative %: 75.8 %
Platelets: 211 10*3/uL (ref 140–400)
RBC: 3.5 10*6/uL — ABNORMAL LOW (ref 4.20–5.80)
RDW: 13.3 % (ref 11.0–15.0)
Total Lymphocyte: 13.7 %
WBC: 10.7 10*3/uL (ref 3.8–10.8)

## 2018-07-06 LAB — COMPREHENSIVE METABOLIC PANEL
AG Ratio: 1.7 (calc) (ref 1.0–2.5)
ALT: 38 U/L (ref 9–46)
AST: 28 U/L (ref 10–35)
Albumin: 4.4 g/dL (ref 3.6–5.1)
Alkaline phosphatase (APISO): 46 U/L (ref 35–144)
BUN/Creatinine Ratio: 18 (calc) (ref 6–22)
BUN: 30 mg/dL — ABNORMAL HIGH (ref 7–25)
CO2: 24 mmol/L (ref 20–32)
Calcium: 9.4 mg/dL (ref 8.6–10.3)
Chloride: 105 mmol/L (ref 98–110)
Creat: 1.66 mg/dL — ABNORMAL HIGH (ref 0.70–1.25)
Globulin: 2.6 g/dL (calc) (ref 1.9–3.7)
Glucose, Bld: 160 mg/dL — ABNORMAL HIGH (ref 65–99)
Potassium: 4.9 mmol/L (ref 3.5–5.3)
Sodium: 137 mmol/L (ref 135–146)
Total Bilirubin: 0.5 mg/dL (ref 0.2–1.2)
Total Protein: 7 g/dL (ref 6.1–8.1)

## 2018-07-07 ENCOUNTER — Encounter: Payer: Self-pay | Admitting: Family Medicine

## 2018-07-08 LAB — POC INFLUENZA A&B (BINAX/QUICKVUE)
Influenza A, POC: NEGATIVE
Influenza B, POC: NEGATIVE

## 2018-07-09 ENCOUNTER — Other Ambulatory Visit: Payer: Self-pay | Admitting: Internal Medicine

## 2018-07-10 ENCOUNTER — Encounter: Payer: Self-pay | Admitting: Family Medicine

## 2018-07-10 ENCOUNTER — Telehealth: Payer: Self-pay | Admitting: Family Medicine

## 2018-07-10 ENCOUNTER — Telehealth: Payer: Self-pay | Admitting: *Deleted

## 2018-07-10 NOTE — Telephone Encounter (Signed)
Copied from [place] [id number]. Topic: Quick Communication - See Telephone Encounter >> Jul 10, 2018  2:34 PM Bea Graff, NT wrote: CRM for notification. See Telephone encounter for: 07/10/18. Pt checking status on his COVID 19 result. Requesting a call as soon as the lab is resulted.

## 2018-07-10 NOTE — Telephone Encounter (Signed)
-----   Message from Ann Held, DO sent at 07/08/2018 10:57 AM EDT ----- Check on pt today---does he need anything?

## 2018-07-10 NOTE — Telephone Encounter (Signed)
Still pending

## 2018-07-11 ENCOUNTER — Encounter: Payer: Self-pay | Admitting: Family Medicine

## 2018-07-11 ENCOUNTER — Ambulatory Visit (INDEPENDENT_AMBULATORY_CARE_PROVIDER_SITE_OTHER): Payer: 59 | Admitting: Family Medicine

## 2018-07-11 ENCOUNTER — Encounter: Payer: Self-pay | Admitting: *Deleted

## 2018-07-11 ENCOUNTER — Other Ambulatory Visit: Payer: Self-pay

## 2018-07-11 DIAGNOSIS — J40 Bronchitis, not specified as acute or chronic: Secondary | ICD-10-CM

## 2018-07-11 MED ORDER — AZITHROMYCIN 250 MG PO TABS: ORAL_TABLET | ORAL | 0 refills | Status: DC

## 2018-07-11 NOTE — Progress Notes (Signed)
Virtual Visit via Video Note  I connected with Britt Boozer on 07/11/18 at  8:45 AM EDT by a video enabled telemedicine application and verified that I am speaking with the correct person using two identifiers.   I discussed the limitations of evaluation and management by telemedicine and the availability of in person appointments. The patient expressed understanding and agreed to proceed. The pt is home and I am in the office  History of Present Illness: Pt f/u resp symptoms, fever --- fever is back --  Comes and goes but he is taking tylenol regularly   + green / yellow mucus  + cough  Observations/Objective:  fever 100 Monday ---  Not toxic looking  Pt not sob   Assessment and Plan:  Bronchitis -- abx per orders     Ok to take otc cough meds     Tylenol for fever       Call if symptoms no better or worsen   covid test still pending Follow Up Instructions:    I discussed the assessment and treatment plan with the patient. The patient was provided an opportunity to ask questions and all were answered. The patient agreed with the plan and demonstrated an understanding of the instructions.   The patient was advised to call back or seek an in-person evaluation if the symptoms worsen or if the condition fails to improve as anticipated.  I provided 25 minutes of non-face-to-face time during this encounter.   Ann Held, DO

## 2018-07-11 NOTE — Telephone Encounter (Signed)
Patient seen today

## 2018-07-15 ENCOUNTER — Encounter: Payer: Self-pay | Admitting: Family Medicine

## 2018-07-15 LAB — NOVEL CORONAVIRUS, NAA: SARS-CoV-2, NAA: NOT DETECTED

## 2018-07-16 ENCOUNTER — Telehealth: Payer: Self-pay | Admitting: *Deleted

## 2018-07-16 NOTE — Telephone Encounter (Signed)
Received results from [facility]; forwarded to provider/SLS 03/31

## 2018-07-22 DIAGNOSIS — H9202 Otalgia, left ear: Secondary | ICD-10-CM | POA: Diagnosis not present

## 2018-07-22 DIAGNOSIS — H608X3 Other otitis externa, bilateral: Secondary | ICD-10-CM | POA: Diagnosis not present

## 2018-08-15 ENCOUNTER — Telehealth: Payer: Self-pay

## 2018-08-15 NOTE — Telephone Encounter (Signed)
Called patient. Reminded him of appointment he has scheduled with provider tomorrow. Advised I would forward information to Dr. Etter SjogrenCheri Rous and they could discuss during visit. Patient agreed.

## 2018-08-15 NOTE — Telephone Encounter (Signed)
Copied from [place] [phone]. Topic: General - Other >> Aug 15, 2018 10:29 AM Leward Quan A wrote: Reason for CRM: Patient called to say that his left ear is still clogged and he still have chills and fever on and off he is asking for a call back to discuss what he should be doing. Per patient he was tested for tee Flu, Covid19 and everything was fine but he would like to get to the bottom of why he is still experiencing these symptoms.  Ph# [phone]

## 2018-08-16 ENCOUNTER — Ambulatory Visit (INDEPENDENT_AMBULATORY_CARE_PROVIDER_SITE_OTHER): Payer: 59 | Admitting: Family Medicine

## 2018-08-16 ENCOUNTER — Other Ambulatory Visit: Payer: Self-pay

## 2018-08-16 ENCOUNTER — Encounter: Payer: Self-pay | Admitting: Family Medicine

## 2018-08-16 DIAGNOSIS — J069 Acute upper respiratory infection, unspecified: Secondary | ICD-10-CM | POA: Diagnosis not present

## 2018-08-16 MED ORDER — LORATADINE 10 MG PO TABS: 10.0000 mg | ORAL_TABLET | Freq: Every day | ORAL | 11 refills | Status: DC

## 2018-08-16 NOTE — Progress Notes (Signed)
Virtual Visit via Video Note  I connected with Alex Rocha on 08/16/18 at 10:15 AM EDT by a video enabled telemedicine application and verified that I am speaking with the correct person using two identifiers.  Location: Patient: home Provider: office   I discussed the limitations of evaluation and management by telemedicine and the availability of in person appointments. The patient expressed understanding and agreed to proceed.  History of Present Illness: Pt c/o return of fever , ear pain and is taking nyquil, using ear drops and nose spray.  He is also taking benadryl but It makes him sleepy-- it does help the symptoms though He also c/o chills  No cough  No body aches, no chest pain or sob      Past Medical History:  Diagnosis Date  . Colon polyps   . Diabetes mellitus   . Environmental allergies   . Hyperlipidemia   . Hypertension   . Hyperthyroidism   . Kidney stones   . Low testosterone    Outpatient Encounter Medications as of 08/16/2018  Medication Sig  . albuterol (PROVENTIL HFA;VENTOLIN HFA) 108 (90 Base) MCG/ACT inhaler Inhale 2 puffs into the lungs every 6 (six) hours as needed for wheezing or shortness of breath.  Marland Kitchen atorvastatin (LIPITOR) 40 MG tablet TAKE 1 TABLET BY MOUTH EVERYDAY AT BEDTIME*MAX ON INSUR*  . azelastine (ASTELIN) 0.1 % nasal spray Place 2 sprays into both nostrils at bedtime as needed for rhinitis or allergies. Use in each nostril as directed  . diclofenac sodium (VOLTAREN) 1 % GEL APPLY 4 G TOPICALLY 4 (FOUR) TIMES DAILY.  . fenofibrate 160 MG tablet TAKE 1 TABLET BY MOUTH EVERY DAY  . fluticasone (FLONASE) 50 MCG/ACT nasal spray Place 2 sprays into both nostrils daily.  Marland Kitchen glucose blood (ONETOUCH VERIO) test strip 1 each by Other route 2 (two) times daily. Use to monitor glucose levels BID; E11.51  . insulin NPH Human (HUMULIN N) 100 UNIT/ML injection 140 UNITS EACH MORNING AND 100 UNITS EACH EVENING  . Insulin Syringe-Needle U-100 (B-D INS  SYR ULTRAFINE 1CC/31G) 31G X 5/16" 1 ML MISC 1 strip by Other route 2 (two) times daily. And lancets 2/day  . levofloxacin (LEVAQUIN) 500 MG tablet Take 1 tablet (500 mg total) by mouth daily.  Marland Kitchen levothyroxine (SYNTHROID, LEVOTHROID) 100 MCG tablet TAKE 1 TABLET BY MOUTH ONCE DAILY BEFORE BREAKFAST  . losartan (COZAAR) 100 MG tablet Take 1 tablet (100 mg total) by mouth daily.  . meloxicam (MOBIC) 7.5 MG tablet TAKE 1 TO 2 TABLETS BY MOUTH EVERY DAY AS NEEDED  . [DISCONTINUED] azithromycin (ZITHROMAX Z-PAK) 250 MG tablet Take 2 tablets now then 1 tablet daily for 4 days.  Dx Code: J40  . loratadine (CLARITIN) 10 MG tablet Take 1 tablet (10 mg total) by mouth daily.   No facility-administered encounter medications on file as of 08/16/2018.    Observations/Objective: Temp99.3  Temp has not been higher than that Pt in NAD rr normal   Assessment and Plan: 1. Viral upper respiratory tract infection Add claritin to  Nose spray  con't tylenol prn  F/u ent  Consider allergy referral  - loratadine (CLARITIN) 10 MG tablet; Take 1 tablet (10 mg total) by mouth daily.  Dispense: 30 tablet; Refill: 11   Follow Up Instructions:    I discussed the assessment and treatment plan with the patient. The patient was provided an opportunity to ask questions and all were answered. The patient agreed with the plan and demonstrated  an understanding of the instructions.   The patient was advised to call back or seek an in-person evaluation if the symptoms worsen or if the condition fails to improve as anticipated.  I provided 25 minutes of non-face-to-face time during this encounter.   Ann Held, DO

## 2018-09-02 DIAGNOSIS — H60312 Diffuse otitis externa, left ear: Secondary | ICD-10-CM | POA: Diagnosis not present

## 2018-09-08 ENCOUNTER — Other Ambulatory Visit: Payer: Self-pay | Admitting: Family Medicine

## 2018-09-23 ENCOUNTER — Ambulatory Visit: Payer: 59 | Admitting: Family Medicine

## 2018-09-23 ENCOUNTER — Other Ambulatory Visit: Payer: Self-pay

## 2018-09-23 ENCOUNTER — Encounter: Payer: Self-pay | Admitting: Family Medicine

## 2018-09-23 VITALS — BP 132/70 | HR 87 | Temp 98.6°F | Resp 18 | Ht 73.0 in | Wt 306.0 lb

## 2018-09-23 DIAGNOSIS — L03032 Cellulitis of left toe: Secondary | ICD-10-CM | POA: Diagnosis not present

## 2018-09-23 MED ORDER — DOXYCYCLINE HYCLATE 100 MG PO CAPS: 100.0000 mg | ORAL_CAPSULE | Freq: Two times a day (BID) | ORAL | 0 refills | Status: DC

## 2018-09-23 NOTE — Patient Instructions (Signed)
It was nice to see you today- we got some pus out from around the nail.  Let's continue to treat the infection with doxycycline antibiotic twice a day for 10 days. Do warm water soaks for a couple of days (with antibacterial soap); but we sure to dry the toe well after!    Please let me know if not significantly improved in the next few days- Sooner if worse.

## 2018-09-23 NOTE — Progress Notes (Signed)
[facility] [address] [place], [place] [address] [phone]  Date:  09/23/2018   Name:  Alex Rocha   DOB:  1955-06-04   MRN:  [id number]  PCP:  Ann Held, DO    Chief Complaint: Ingrown Toenail (left great toe, one week)   History of Present Illness:  Alex Rocha is a 63 y.o. very pleasant male patient who presents with the following:  Pt of Dr. Etter Sjogren here today with concern of toenail problem History of DM He has noted a problem with his left great toenail- he has needed to have this removed in the past but it grew back.   It started to hurt over the last few days again -pain is on the medial aspect of the nail fold.  He is not aware of any trauma to the nail or other injury.  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 ETD (eustachian tube dysfunction) 06/08/2018  . Acute left-sided low back pain with left-sided sciatica 05/22/2017  . Tingling in extremities 08/14/2016  . Arthralgia 08/14/2016  . Arthritis 05/29/2016  . De Quervain's tenosynovitis, right 05/29/2016  . Sleep disorder 12/06/2015  . DM (diabetes mellitus) type II uncontrolled, periph vascular disorder ([place]) 06/19/2015  . Light headedness 01/11/2015  . Gastroesophageal reflux disease without esophagitis 02/13/2014  . Gout 01/27/2014  . 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 Sinusitis 08/25/2013  . Multinodular goiter 01/22/2013  . HTN (hypertension) 11/25/2012  . Hyperlipidemia LDL goal <70 11/25/2012  . Left otitis media 11/25/2012  . Low testosterone 11/25/2012    Past Medical History:  Diagnosis Date  . Colon polyps   .  Diabetes mellitus   . Environmental allergies   . Hyperlipidemia   . Hypertension   . Hyperthyroidism   . Kidney stones   . Low testosterone     No past surgical history on file.  Social History   Tobacco Use  . Smoking status: Never Smoker  . Smokeless tobacco: Never Used  Substance Use Topics  . Alcohol use: Yes    Alcohol/week: 0.0 standard drinks    Comment: social  . Drug use: No    Family History  Problem Relation Age of Onset  . Colon cancer Mother   . Heart disease Mother   . Hypertension Mother        Mother's side of the family  . Diabetes Mother        Mother's side of the family  . Colon cancer Brother   . Hyperlipidemia Brother        Mother's side of the family  . Heart disease Sister   . Diabetes Other        father's family    Allergies  Allergen Reactions  . [name] [Yohimbine] Palpitations    Medication list has been reviewed and updated.  Current Outpatient Medications on File Prior to Visit  Medication Sig Dispense Refill  . albuterol (PROVENTIL HFA;VENTOLIN HFA) 108 (90 Base) MCG/ACT inhaler Inhale 2 puffs into the lungs every 6 (six) hours as needed for wheezing or shortness of breath. 1 Inhaler  0  . atorvastatin (LIPITOR) 40 MG tablet TAKE 1 TABLET BY MOUTH EVERY DAY AT BEDTIME *MAX ON INSURANCE* 30 tablet 0  . azelastine (ASTELIN) 0.1 % nasal spray Place 2 sprays into both nostrils at bedtime as needed for rhinitis or allergies. Use in each nostril as directed 30 mL 3  . diclofenac sodium (VOLTAREN) 1 % GEL APPLY 4 G TOPICALLY 4 (FOUR) TIMES DAILY. 100 g 1  . fenofibrate 160 MG tablet TAKE 1 TABLET BY MOUTH EVERY DAY 30 tablet 5  . fluticasone (FLONASE) 50 MCG/ACT nasal spray Place 2 sprays into both nostrils daily.    Marland Kitchen glucose blood (ONETOUCH VERIO) test strip 1 each by Other route 2 (two) times daily. Use to monitor glucose levels BID; E11.51 100 each 11  . insulin NPH Human (HUMULIN N) 100 UNIT/ML injection 140 UNITS EACH MORNING AND  100 UNITS EACH EVENING 80 mL 11  . Insulin Syringe-Needle U-100 (B-D INS SYR ULTRAFINE 1CC/31G) 31G X 5/16" 1 ML MISC 1 strip by Other route 2 (two) times daily. And lancets 2/day 200 each 3  . levofloxacin (LEVAQUIN) 500 MG tablet Take 1 tablet (500 mg total) by mouth daily. 10 tablet 0  . levothyroxine (SYNTHROID, LEVOTHROID) 100 MCG tablet TAKE 1 TABLET BY MOUTH ONCE DAILY BEFORE BREAKFAST 30 tablet 5  . loratadine (CLARITIN) 10 MG tablet Take 1 tablet (10 mg total) by mouth daily. 30 tablet 11  . losartan (COZAAR) 100 MG tablet Take 1 tablet (100 mg total) by mouth daily. 90 tablet 1  . meloxicam (MOBIC) 7.5 MG tablet TAKE 1 TO 2 TABLETS BY MOUTH EVERY DAY AS NEEDED 60 tablet 1   No current facility-administered medications on file prior to visit.     Review of Systems:  As per HPI- otherwise negative. No fever or chills  Physical Examination: Vitals:   09/23/18 1540  BP: 132/70  Pulse: 87  Resp: 18  Temp: 98.6 F (37 C)  SpO2: 97%   Vitals:   09/23/18 1540  Weight: (!) 306 lb (138.8 kg)  Height: 6\' 1"  (1.854 m)   Body mass index is 40.37 kg/m. Ideal Body Weight: Weight in (lb) to have BMI = 25: 189.1  GEN: WDWN, NAD, Non-toxic, A & O x 3, obese, tall build.  Looks well HEENT: Atraumatic, Normocephalic. Neck supple. No masses, No LAD. Ears and Nose: No external deformity. CV: RRR, No M/G/R. No JVD. No thrill. No extra heart sounds. PULM: CTA B, no wheezes, crackles, rhonchi. No retractions. No resp. distress. No accessory muscle use. EXTR: No c/c/e NEURO Normal gait.  PSYCH: Normally interactive. Conversant. Not depressed or anxious appearing.  Calm demeanor.  The left great toenail shows evidence of fungal infection, and previous trauma.  The nail has a domed appearance.  However, I do not appreciate any actual ingrown nail.  During examination of nail pus spontaneously expressed from the medial nail fold.  No redness or heat  Assessment and Plan: Paronychia of  great toe of left foot - Plan: doxycycline (VIBRAMYCIN) 100 MG capsule  Here today with left great toenail pain, likely due to paronychia.  Drained spontaneously during exam in clinic.  We will have him take doxycycline twice a day for 10 days, and also use warm soaks to drain any further pus cannulation.  I have asked Roxy to watch this closely, and let me know if not improving over the next couple of days.  Signed Lamar Blinks, MD

## 2018-09-25 ENCOUNTER — Other Ambulatory Visit: Payer: Self-pay | Admitting: Family Medicine

## 2018-09-25 DIAGNOSIS — M5442 Lumbago with sciatica, left side: Secondary | ICD-10-CM

## 2018-10-04 ENCOUNTER — Other Ambulatory Visit: Payer: Self-pay | Admitting: Family Medicine

## 2018-10-09 ENCOUNTER — Other Ambulatory Visit: Payer: Self-pay

## 2018-10-14 ENCOUNTER — Encounter: Payer: Self-pay | Admitting: Endocrinology

## 2018-10-14 ENCOUNTER — Ambulatory Visit (INDEPENDENT_AMBULATORY_CARE_PROVIDER_SITE_OTHER): Payer: 59 | Admitting: Endocrinology

## 2018-10-14 ENCOUNTER — Other Ambulatory Visit: Payer: Self-pay

## 2018-10-14 VITALS — BP 130/60 | HR 90 | Ht 73.0 in | Wt 307.4 lb

## 2018-10-14 DIAGNOSIS — Z794 Long term (current) use of insulin: Secondary | ICD-10-CM

## 2018-10-14 DIAGNOSIS — E119 Type 2 diabetes mellitus without complications: Secondary | ICD-10-CM | POA: Diagnosis not present

## 2018-10-14 DIAGNOSIS — E890 Postprocedural hypothyroidism: Secondary | ICD-10-CM | POA: Diagnosis not present

## 2018-10-14 LAB — POCT GLYCOSYLATED HEMOGLOBIN (HGB A1C): Hemoglobin A1C: 6.1 % — AB (ref 4.0–5.6)

## 2018-10-14 MED ORDER — INSULIN NPH (HUMAN) (ISOPHANE) 100 UNIT/ML ~~LOC~~ SUSP: SUBCUTANEOUS | 11 refills | Status: DC

## 2018-10-14 NOTE — Progress Notes (Signed)
Subjective:    Patient ID: Alex Rocha, male    DOB: [birth year], 63 y.o.   MRN: [id number]  HPI Pt returns for f/u of diabetes mellitus: DM type: Insulin-requiring type 2. Dx'ed: 2000.  Complications: polyneuropathy and renal insuff.   Therapy: insulin since 2014.  DKA: never.  Severe hypoglycemia: once, in 2018 Pancreatitis: never.   Other: he declines multiple daily injections; he says his need to care for his wife, who has cancer, limits his ability to care for himself; he declines weight-loss surgery; he takes human insulin, due to cost.   Interval history: no cbg record, but states cbg's vary from 53-210.  There is no trend throughout the day.  He has mild hypoglycemia approx twice per month.  No recent steroids.    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   Past Medical History:  Diagnosis Date  . Colon polyps   . Diabetes mellitus   . Environmental allergies   . Hyperlipidemia   . Hypertension   . Hyperthyroidism   . Kidney stones   . Low testosterone     No past surgical history on file.  Social History   Socioeconomic History  . Marital status: Married    Spouse name: Not on file  . Number of children: 2  . Years of education: Not on file  . Highest education level: Not on file  Occupational History  . Occupation: Nurse, adult  Social Needs  . Financial resource strain: Not on file  . Food insecurity    Worry: Not on file    Inability: Not on file  . Transportation needs    Medical: Not on file    Non-medical: Not on file  Tobacco Use  . Smoking status: Never Smoker  . Smokeless tobacco: Never Used  Substance and Sexual Activity  . Alcohol use: Yes    Alcohol/week: 0.0 standard drinks    Comment: social  . Drug use: No  . Sexual activity: Not on file  Lifestyle  . Physical activity    Days per week: Not on file   Minutes per session: Not on file  . Stress: Not on file  Relationships  . Social Herbalist on phone: Not on file    Gets together: Not on file    Attends religious service: Not on file    Active member of club or organization: Not on file    Attends meetings of clubs or organizations: Not on file    Relationship status: Not on file  . Intimate partner violence    Fear of current or ex partner: Not on file    Emotionally abused: Not on file    Physically abused: Not on file    Forced sexual activity: Not on file  Other Topics Concern  . Not on file  Social History Narrative  . Not on file    Current Outpatient Medications on File Prior to Visit  Medication Sig Dispense Refill  . albuterol (PROVENTIL HFA;VENTOLIN HFA) 108 (90 Base) MCG/ACT inhaler Inhale 2 puffs into the lungs every 6 (six) hours as needed for wheezing or shortness of breath. 1 Inhaler 0  . atorvastatin (LIPITOR) 40 MG tablet TAKE 1 TABLET BY MOUTH EVERY DAY AT BEDTIME *MAX ON INSURANCE* 30 tablet 0  . azelastine (ASTELIN) 0.1 % nasal spray Place 2 sprays into  both nostrils at bedtime as needed for rhinitis or allergies. Use in each nostril as directed 30 mL 3  . diclofenac sodium (VOLTAREN) 1 % GEL APPLY 4 G TOPICALLY 4 (FOUR) TIMES DAILY. 100 g 1  . fenofibrate 160 MG tablet TAKE 1 TABLET BY MOUTH EVERY DAY 30 tablet 5  . fluticasone (FLONASE) 50 MCG/ACT nasal spray Place 2 sprays into both nostrils daily.    Marland Kitchen glucose blood (ONETOUCH VERIO) test strip 1 each by Other route 2 (two) times daily. Use to monitor glucose levels BID; E11.51 100 each 11  . Insulin Syringe-Needle U-100 (B-D INS SYR ULTRAFINE 1CC/31G) 31G X 5/16" 1 ML MISC 1 strip by Other route 2 (two) times daily. And lancets 2/day 200 each 3  . levothyroxine (SYNTHROID, LEVOTHROID) 100 MCG tablet TAKE 1 TABLET BY MOUTH ONCE DAILY BEFORE BREAKFAST 30 tablet 5  . loratadine (CLARITIN) 10 MG tablet Take 1 tablet (10 mg total) by mouth daily. 30  tablet 11  . losartan (COZAAR) 100 MG tablet Take 1 tablet (100 mg total) by mouth daily. 90 tablet 1  . meloxicam (MOBIC) 7.5 MG tablet TAKE 1 TO 2 TABLETS BY MOUTH EVERY DAY AS NEEDED 60 tablet 1   No current facility-administered medications on file prior to visit.     Allergies  Allergen Reactions  . [name] [Yohimbine] Palpitations    Family History  Problem Relation Age of Onset  . Colon cancer Mother   . Heart disease Mother   . Hypertension Mother        Mother's side of the family  . Diabetes Mother        Mother's side of the family  . Colon cancer Brother   . Hyperlipidemia Brother        Mother's side of the family  . Heart disease Sister   . Diabetes Other        father's family    BP 130/60 (BP Location: Left Arm, Patient Position: Sitting, Cuff Size: Large)   Pulse 90   Ht 6\' 1"  (1.854 m)   Wt (!) 307 lb 6.4 oz (139.4 kg)   SpO2 93%   BMI 40.56 kg/m    Review of Systems Denies LOC.      Objective:   Physical Exam VITAL SIGNS:  See vs page GENERAL: no distress Pulses: dorsalis pedis intact bilat.   MSK: no deformity of the feet CV: no leg edema Skin:  no ulcer on the feet.  normal color and temp on the feet. Neuro: sensation is intact to touch on the feet.   Ext: trace bilat leg edema.  Several ingrown toenails.  Left 2nd toenail is absent    Lab Results  Component Value Date   CREATININE 1.66 (H) 07/05/2018   BUN 30 (H) 07/05/2018   NA 137 07/05/2018   K 4.9 07/05/2018   CL 105 07/05/2018   CO2 24 07/05/2018    Lab Results  Component Value Date   HGBA1C 6.1 (A) 10/14/2018       Assessment & Plan:  Insulin-requiring type 2 DM, with PN:  Renal failure: this may be falsely suppressing A1c.  Hypoglycemia: this limits aggressiveness of glycemic control.    Patient Instructions  A different type of diabetes blood test is requested for you today.  We'll let you know about the results.  Please reduce the insulin to the numbers listed  below.  check your blood sugar twice a day.  vary the time of day when  you check, between before the 3 meals, and at bedtime.  also check if you have symptoms of your blood sugar being too high or too low.  please keep a record of the readings and bring it to your next appointment here.  You can write it on any piece of paper.  please call us sooner if your blood sugar goes below 70, or if you have a lot of readings over 200.   On this type of insulin schedule, you should eat meals on a regular schedule (especially lunch).  If a meal is missed or significantly delayed, your blood sugar could go low.   Please come back for a follow-up appointment in 3 months.

## 2018-10-14 NOTE — Patient Instructions (Addendum)
A different type of diabetes blood test is requested for you today.  We'll let you know about the results.  Please reduce the insulin to the numbers listed below.  check your blood sugar twice a day.  vary the time of day when you check, between before the 3 meals, and at bedtime.  also check if you have symptoms of your blood sugar being too high or too low.  please keep a record of the readings and bring it to your next appointment here.  You can write it on any piece of paper.  please call us sooner if your blood sugar goes below 70, or if you have a lot of readings over 200.   On this type of insulin schedule, you should eat meals on a regular schedule (especially lunch).  If a meal is missed or significantly delayed, your blood sugar could go low.   Please come back for a follow-up appointment in 3 months.

## 2018-10-16 ENCOUNTER — Telehealth: Payer: Self-pay | Admitting: Endocrinology

## 2018-10-16 ENCOUNTER — Other Ambulatory Visit: Payer: Self-pay | Admitting: Family Medicine

## 2018-10-16 DIAGNOSIS — E890 Postprocedural hypothyroidism: Secondary | ICD-10-CM

## 2018-10-16 NOTE — Telephone Encounter (Signed)
Alex Rocha pt marked [name] by mistake  Pt has lost his levothyroxine and [facility] not [facility]. It was called in with refills by [facility]  [facility]

## 2018-10-16 NOTE — Telephone Encounter (Signed)
levothyroxine (SYNTHROID) 100 MCG tablet 30 tablet 5 10/16/2018    Sig: TAKE 1 TABLET BY MOUTH ONCE DAILY BEFORE BREAKFAST   Sent to pharmacy as: levothyroxine (SYNTHROID) 100 MCG tablet   Notes to Pharmacy: DX Code Needed  PATIENT ACCIDENTALLY LOST RX, WILLNEED MORE REFILLS.   E-Prescribing Status: Receipt confirmed by pharmacy (10/16/2018 11:23 AM EDT)    Pharmacy:  [id number] [place], [place]

## 2018-10-18 LAB — FRUCTOSAMINE: Fructosamine: 265 umol/L (ref 205–285)

## 2018-10-26 ENCOUNTER — Encounter: Payer: Self-pay | Admitting: Endocrinology

## 2018-10-28 ENCOUNTER — Other Ambulatory Visit: Payer: Self-pay | Admitting: Endocrinology

## 2018-10-28 MED ORDER — ACCU-CHEK NANO SMARTVIEW W/DEVICE KIT: 1.0000 | PACK | Freq: Once | 0 refills | Status: AC

## 2018-10-28 MED ORDER — ACCU-CHEK SMARTVIEW VI STRP: 1.0000 | ORAL_STRIP | Freq: Two times a day (BID) | 12 refills | Status: DC

## 2018-10-28 NOTE — Telephone Encounter (Signed)
Please advise 

## 2018-11-01 ENCOUNTER — Other Ambulatory Visit: Payer: Self-pay | Admitting: Family Medicine

## 2018-11-01 MED ORDER — LOSARTAN POTASSIUM 100 MG PO TABS: 100.0000 mg | ORAL_TABLET | Freq: Every day | ORAL | 1 refills | Status: DC

## 2018-11-01 NOTE — Telephone Encounter (Signed)
Rx sent 

## 2018-11-01 NOTE — Telephone Encounter (Signed)
Medication Refill - Medication:  losartan (COZAAR) 100 MG tablet  Has the patient contacted their pharmacy? Yes, advised to call office. Patient is completely out.   Preferred Pharmacy (with phone number or street name):  [id number] [place], [place] - [place] [phone] (Phone) [phone] (Fax)   Agent: Please be advised that RX refills may take up to 3 business days. We ask that you follow-up with your pharmacy.

## 2018-11-04 ENCOUNTER — Other Ambulatory Visit: Payer: Self-pay | Admitting: Family Medicine

## 2018-11-23 ENCOUNTER — Other Ambulatory Visit: Payer: Self-pay | Admitting: Family Medicine

## 2018-11-23 DIAGNOSIS — M5442 Lumbago with sciatica, left side: Secondary | ICD-10-CM

## 2018-11-29 ENCOUNTER — Other Ambulatory Visit: Payer: Self-pay | Admitting: Family Medicine

## 2018-12-21 ENCOUNTER — Other Ambulatory Visit: Payer: Self-pay | Admitting: Family Medicine

## 2019-01-02 ENCOUNTER — Other Ambulatory Visit: Payer: Self-pay | Admitting: Family Medicine

## 2019-01-09 ENCOUNTER — Other Ambulatory Visit: Payer: Self-pay

## 2019-01-13 ENCOUNTER — Ambulatory Visit: Payer: 59 | Admitting: Endocrinology

## 2019-01-13 ENCOUNTER — Other Ambulatory Visit: Payer: Self-pay

## 2019-01-13 ENCOUNTER — Encounter: Payer: Self-pay | Admitting: Endocrinology

## 2019-01-13 VITALS — BP 104/56 | HR 83 | Ht 73.0 in | Wt 309.4 lb

## 2019-01-13 DIAGNOSIS — Z794 Long term (current) use of insulin: Secondary | ICD-10-CM

## 2019-01-13 DIAGNOSIS — E119 Type 2 diabetes mellitus without complications: Secondary | ICD-10-CM | POA: Diagnosis not present

## 2019-01-13 LAB — POCT GLYCOSYLATED HEMOGLOBIN (HGB A1C): Hemoglobin A1C: 7.5 % — AB (ref 4.0–5.6)

## 2019-01-13 NOTE — Patient Instructions (Addendum)
Please continue the same insulin. check your blood sugar twice a day.  vary the time of day when you check, between before the 3 meals, and at bedtime.  also check if you have symptoms of your blood sugar being too high or too low.  please keep a record of the readings and bring it to your next appointment here.  You can write it on any piece of paper.  please call us sooner if your blood sugar goes below 70, or if you have a lot of readings over 200.   On this type of insulin schedule, you should eat meals on a regular schedule (especially lunch).  If a meal is missed or significantly delayed, your blood sugar could go low.   Please come back for a follow-up appointment in 4 months.

## 2019-01-13 NOTE — Progress Notes (Signed)
Subjective:    Patient ID: Alex Rocha, male    DOB: 01-14-1956, 63 y.o.   MRN: [id number]  HPI Pt returns for f/u of diabetes mellitus: DM type: Insulin-requiring type 2. Dx'ed: 2000.  Complications: polyneuropathy and renal insuff.   Therapy: insulin since 2014.  DKA: never.  Severe hypoglycemia: once, in 2018 Pancreatitis: never.   Other: he declines multiple daily injections; he says his need to care for his wife, who has cancer, limits his ability to care for himself; he declines weight-loss surgery; he takes human insulin, due to cost.   Interval history: no cbg record, but states cbg's vary from 65-180.  There is no trend throughout the day, except it is lowest when a meal is missed, or with activity.  No recent steroids.    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   Past Medical History:  Diagnosis Date  . Colon polyps   . Diabetes mellitus   . Environmental allergies   . Hyperlipidemia   . Hypertension   . Hyperthyroidism   . Kidney stones   . Low testosterone     No past surgical history on file.  Social History   Socioeconomic History  . Marital status: Married    Spouse name: Not on file  . Number of children: 2  . Years of education: Not on file  . Highest education level: Not on file  Occupational History  . Occupation: Nurse, adult  Social Needs  . Financial resource strain: Not on file  . Food insecurity    Worry: Not on file    Inability: Not on file  . Transportation needs    Medical: Not on file    Non-medical: Not on file  Tobacco Use  . Smoking status: Never Smoker  . Smokeless tobacco: Never Used  Substance and Sexual Activity  . Alcohol use: Yes    Alcohol/week: 0.0 standard drinks    Comment: social  . Drug use: No  . Sexual activity: Not on file  Lifestyle  . Physical activity    Days per week: Not  on file    Minutes per session: Not on file  . Stress: Not on file  Relationships  . Social Herbalist on phone: Not on file    Gets together: Not on file    Attends religious service: Not on file    Active member of club or organization: Not on file    Attends meetings of clubs or organizations: Not on file    Relationship status: Not on file  . Intimate partner violence    Fear of current or ex partner: Not on file    Emotionally abused: Not on file    Physically abused: Not on file    Forced sexual activity: Not on file  Other Topics Concern  . Not on file  Social History Narrative  . Not on file    Current Outpatient Medications on File Prior to Visit  Medication Sig Dispense Refill  . albuterol (PROVENTIL HFA;VENTOLIN HFA) 108 (90 Base) MCG/ACT inhaler Inhale 2 puffs into the lungs every 6 (six) hours as needed for wheezing or shortness of breath. 1 Inhaler 0  . atorvastatin (LIPITOR) 40 MG tablet TAKE 1 TABLET BY MOUTH EVERY DAY AT BEDTIME *MAX ON INSURANCE* 30 tablet 0  . azelastine (ASTELIN) 0.1 % nasal  spray Place 2 sprays into both nostrils at bedtime as needed for rhinitis or allergies. Use in each nostril as directed 30 mL 3  . diclofenac sodium (VOLTAREN) 1 % GEL APPLY 4 G TOPICALLY 4 (FOUR) TIMES DAILY. 100 g 1  . fenofibrate 160 MG tablet TAKE 1 TABLET BY MOUTH EVERY DAY 30 tablet 5  . fluticasone (FLONASE) 50 MCG/ACT nasal spray Place 2 sprays into both nostrils daily.    Marland Kitchen glucose blood (ACCU-CHEK SMARTVIEW) test strip 1 each by Other route 2 (two) times a day. 100 each 12  . insulin NPH Human (HUMULIN N) 100 UNIT/ML injection 130 UNITS EACH MORNING AND 90 UNITS EACH EVENING 80 mL 11  . Insulin Syringe-Needle U-100 (B-D INS SYR ULTRAFINE 1CC/31G) 31G X 5/16" 1 ML MISC 1 strip by Other route 2 (two) times daily. And lancets 2/day 200 each 3  . levothyroxine (SYNTHROID) 100 MCG tablet TAKE 1 TABLET BY MOUTH ONCE DAILY BEFORE BREAKFAST 30 tablet 5  .  loratadine (CLARITIN) 10 MG tablet Take 1 tablet (10 mg total) by mouth daily. 30 tablet 11  . losartan (COZAAR) 100 MG tablet Take 1 tablet (100 mg total) by mouth daily. 90 tablet 1  . meloxicam (MOBIC) 7.5 MG tablet TAKE 1 TO 2 TABLETS BY MOUTH EVERY DAY AS NEEDED 60 tablet 1   No current facility-administered medications on file prior to visit.     Allergies  Allergen Reactions  . [name] [Yohimbine] Palpitations    Family History  Problem Relation Age of Onset  . Colon cancer Mother   . Heart disease Mother   . Hypertension Mother        Mother's side of the family  . Diabetes Mother        Mother's side of the family  . Colon cancer Brother   . Hyperlipidemia Brother        Mother's side of the family  . Heart disease Sister   . Diabetes Other        father's family    BP (!) 104/56 (BP Location: Left Wrist, Patient Position: Sitting, Cuff Size: Large)   Pulse 83   Ht 6\' 1"  (1.854 m)   Wt (!) 309 lb 6.4 oz (140.3 kg)   SpO2 94%   BMI 40.82 kg/m    Review of Systems Denies LOC.      Objective:   Physical Exam VITAL SIGNS:  See vs page GENERAL: no distress Pulses: dorsalis pedis intact bilat.   MSK: no deformity of the feet CV: 1+ bilat leg edema Skin:  no ulcer on the feet.  normal color and temp on the feet.  Neuro: sensation is intact to touch on the feet.   Ext: there is bilateral onychomycosis of the toenails.    Lab Results  Component Value Date   HGBA1C 7.5 (A) 01/13/2019   Lab Results  Component Value Date   CREATININE 1.66 (H) 07/05/2018   BUN 30 (H) 07/05/2018   NA 137 07/05/2018   K 4.9 07/05/2018   CL 105 07/05/2018   CO2 24 07/05/2018       Assessment & Plan:  Insulin-requiring type 2 DM, with renal insuff: this is the best control this pt should aim for, given this regimen, which does match insulin to his changing needs throughout the day.  Hypoglycemia: this limits aggressiveness of glycemic control.    Patient Instructions   Please continue the same insulin. check your blood sugar twice a day.  vary  the time of day when you check, between before the 3 meals, and at bedtime.  also check if you have symptoms of your blood sugar being too high or too low.  please keep a record of the readings and bring it to your next appointment here.  You can write it on any piece of paper.  please call us sooner if your blood sugar goes below 70, or if you have a lot of readings over 200.   On this type of insulin schedule, you should eat meals on a regular schedule (especially lunch).  If a meal is missed or significantly delayed, your blood sugar could go low.   Please come back for a follow-up appointment in 4 months.

## 2019-01-23 ENCOUNTER — Other Ambulatory Visit: Payer: Self-pay | Admitting: Family Medicine

## 2019-01-23 DIAGNOSIS — M5442 Lumbago with sciatica, left side: Secondary | ICD-10-CM

## 2019-01-25 ENCOUNTER — Other Ambulatory Visit: Payer: Self-pay | Admitting: Family Medicine

## 2019-01-30 ENCOUNTER — Other Ambulatory Visit: Payer: Self-pay | Admitting: Endocrinology

## 2019-01-30 DIAGNOSIS — E890 Postprocedural hypothyroidism: Secondary | ICD-10-CM

## 2019-01-30 DIAGNOSIS — E119 Type 2 diabetes mellitus without complications: Secondary | ICD-10-CM

## 2019-02-06 ENCOUNTER — Other Ambulatory Visit: Payer: Self-pay

## 2019-02-07 ENCOUNTER — Encounter: Payer: Self-pay | Admitting: Family Medicine

## 2019-02-07 ENCOUNTER — Ambulatory Visit: Payer: 59 | Admitting: Family Medicine

## 2019-02-07 VITALS — BP 138/60 | HR 79 | Temp 97.8°F | Resp 18 | Ht 73.0 in | Wt 319.4 lb

## 2019-02-07 DIAGNOSIS — E890 Postprocedural hypothyroidism: Secondary | ICD-10-CM

## 2019-02-07 DIAGNOSIS — IMO0002 Reserved for concepts with insufficient information to code with codable children: Secondary | ICD-10-CM

## 2019-02-07 DIAGNOSIS — E039 Hypothyroidism, unspecified: Secondary | ICD-10-CM | POA: Diagnosis not present

## 2019-02-07 DIAGNOSIS — E1169 Type 2 diabetes mellitus with other specified complication: Secondary | ICD-10-CM

## 2019-02-07 DIAGNOSIS — I10 Essential (primary) hypertension: Secondary | ICD-10-CM

## 2019-02-07 DIAGNOSIS — E1165 Type 2 diabetes mellitus with hyperglycemia: Secondary | ICD-10-CM

## 2019-02-07 DIAGNOSIS — E785 Hyperlipidemia, unspecified: Secondary | ICD-10-CM

## 2019-02-07 DIAGNOSIS — E1151 Type 2 diabetes mellitus with diabetic peripheral angiopathy without gangrene: Secondary | ICD-10-CM

## 2019-02-07 LAB — COMPREHENSIVE METABOLIC PANEL
ALT: 43 U/L (ref 0–53)
AST: 36 U/L (ref 0–37)
Albumin: 4.2 g/dL (ref 3.5–5.2)
Alkaline Phosphatase: 37 U/L — ABNORMAL LOW (ref 39–117)
BUN: 32 mg/dL — ABNORMAL HIGH (ref 6–23)
CO2: 24 mEq/L (ref 19–32)
Calcium: 9 mg/dL (ref 8.4–10.5)
Chloride: 113 mEq/L — ABNORMAL HIGH (ref 96–112)
Creatinine, Ser: 1.67 mg/dL — ABNORMAL HIGH (ref 0.40–1.50)
GFR: 50.85 mL/min — ABNORMAL LOW (ref >60.00)
Glucose, Bld: 133 mg/dL — ABNORMAL HIGH (ref 70–99)
Potassium: 4.9 mEq/L (ref 3.5–5.1)
Sodium: 144 mEq/L (ref 135–145)
Total Bilirubin: 0.4 mg/dL (ref 0.2–1.2)
Total Protein: 6.5 g/dL (ref 6.0–8.3)

## 2019-02-07 LAB — LIPID PANEL
Cholesterol: 104 mg/dL (ref 0–200)
HDL: 25.4 mg/dL — ABNORMAL LOW (ref >39.00)
LDL Cholesterol: 57 mg/dL (ref 0–99)
NonHDL: 78.99
Total CHOL/HDL Ratio: 4
Triglycerides: 111 mg/dL (ref 0.0–149.0)
VLDL: 22.2 mg/dL (ref 0.0–40.0)

## 2019-02-07 LAB — TSH: TSH: 3.25 u[IU]/mL (ref 0.35–4.50)

## 2019-02-07 NOTE — Patient Instructions (Signed)

## 2019-02-07 NOTE — Progress Notes (Signed)
Patient ID: Alex Rocha, male    DOB: 1955-05-26  Age: 63 y.o. MRN: [id number]    Subjective:  Subjective  HPI Alex Rocha presents for f/u bp and thyroid issues   No other complaints    Review of Systems  Constitutional: Negative for appetite change, diaphoresis, fatigue and unexpected weight change.  Eyes: Negative for pain, redness and visual disturbance.  Respiratory: Negative for cough, chest tightness, shortness of breath and wheezing.   Cardiovascular: Negative for chest pain, palpitations and leg swelling.  Endocrine: Negative for cold intolerance, heat intolerance, polydipsia, polyphagia and polyuria.  Genitourinary: Negative for difficulty urinating, dysuria and frequency.  Neurological: Negative for dizziness, light-headedness, numbness and headaches.    History Past Medical History:  Diagnosis Date  . Colon polyps   . Diabetes mellitus   . Environmental allergies   . Hyperlipidemia   . Hypertension   . Hyperthyroidism   . Kidney stones   . Low testosterone     He has no past surgical history on file.   His family history includes Colon cancer in his brother and mother; Diabetes in his mother and another family member; Heart disease in his mother and sister; Hyperlipidemia in his brother; Hypertension in his mother.He reports that he has never smoked. He has never used smokeless tobacco. He reports current alcohol use. He reports that he does not use drugs.  Current Outpatient Medications on File Prior to Visit  Medication Sig Dispense Refill  . albuterol (PROVENTIL HFA;VENTOLIN HFA) 108 (90 Base) MCG/ACT inhaler Inhale 2 puffs into the lungs every 6 (six) hours as needed for wheezing or shortness of breath. 1 Inhaler 0  . atorvastatin (LIPITOR) 40 MG tablet TAKE 1 TABLET BY MOUTH EVERY DAY AT BEDTIME *MAX ON INSURANCE* 30 tablet 0  . azelastine (ASTELIN) 0.1 % nasal spray Place 2 sprays into both nostrils at bedtime as needed for rhinitis or allergies. Use  in each nostril as directed 30 mL 3  . diclofenac sodium (VOLTAREN) 1 % GEL APPLY 4 G TOPICALLY 4 (FOUR) TIMES DAILY. 100 g 1  . fenofibrate 160 MG tablet TAKE 1 TABLET BY MOUTH EVERY DAY 30 tablet 5  . fluticasone (FLONASE) 50 MCG/ACT nasal spray Place 2 sprays into both nostrils daily.    Marland Kitchen glucose blood (ACCU-CHEK SMARTVIEW) test strip 1 each by Other route 2 (two) times a day. 100 each 12  . insulin NPH Human (HUMULIN N) 100 UNIT/ML injection INJECT 130 UNITS EACH MORNING AND 90 UNITS EACH EVENING 70 mL 13  . Insulin Syringe-Needle U-100 (B-D INS SYR ULTRAFINE 1CC/31G) 31G X 5/16" 1 ML MISC 1 strip by Other route 2 (two) times daily. And lancets 2/day 200 each 3  . levothyroxine (SYNTHROID) 100 MCG tablet TAKE 1 TABLET BY MOUTH ONCE DAILY BEFORE BREAKFAST 30 tablet 5  . loratadine (CLARITIN) 10 MG tablet Take 1 tablet (10 mg total) by mouth daily. 30 tablet 11  . losartan (COZAAR) 100 MG tablet Take 1 tablet (100 mg total) by mouth daily. 90 tablet 1  . meloxicam (MOBIC) 7.5 MG tablet TAKE 1 TO 2 TABLETS BY MOUTH EVERY DAY AS NEEDED 60 tablet 1   No current facility-administered medications on file prior to visit.      Objective:  Objective  Physical Exam Vitals signs and nursing note reviewed.  Constitutional:      General: He is sleeping.     Appearance: He is well-developed.  HENT:     Head: Normocephalic and  atraumatic.  Eyes:     Pupils: Pupils are equal, round, and reactive to light.  Neck:     Musculoskeletal: Normal range of motion and neck supple.     Thyroid: No thyromegaly.  Cardiovascular:     Rate and Rhythm: Normal rate and regular rhythm.     Heart sounds: No murmur.  Pulmonary:     Effort: Pulmonary effort is normal. No respiratory distress.     Breath sounds: Normal breath sounds. No wheezing or rales.  Chest:     Chest wall: No tenderness.  Musculoskeletal:        General: No tenderness.  Skin:    General: Skin is warm and dry.  Neurological:     Mental  Status: He is oriented to person, place, and time.  Psychiatric:        Behavior: Behavior normal.        Thought Content: Thought content normal.        Judgment: Judgment normal.    BP 138/60 (BP Location: Right Arm, Patient Position: Sitting, Cuff Size: Large)   Pulse 79   Temp 97.8 F (36.6 C) (Temporal)   Resp 18   Ht 6\' 1"  (1.854 m)   Wt (!) 319 lb 6.4 oz (144.9 kg)   SpO2 95%   BMI 42.14 kg/m  Wt Readings from Last 3 Encounters:  02/07/19 (!) 319 lb 6.4 oz (144.9 kg)  01/13/19 (!) 309 lb 6.4 oz (140.3 kg)  10/14/18 (!) 307 lb 6.4 oz (139.4 kg)     Lab Results  Component Value Date   WBC 10.7 07/05/2018   HGB 10.9 (L) 07/05/2018   HCT 32.4 (L) 07/05/2018   PLT 211 07/05/2018   GLUCOSE 160 (H) 07/05/2018   CHOL 124 12/04/2017   TRIG 222.0 (H) 12/04/2017   HDL 23.90 (L) 12/04/2017   LDLDIRECT 73.0 12/04/2017   LDLCALC 68 05/22/2017   ALT 38 07/05/2018   AST 28 07/05/2018   NA 137 07/05/2018   K 4.9 07/05/2018   CL 105 07/05/2018   CREATININE 1.66 (H) 07/05/2018   BUN 30 (H) 07/05/2018   CO2 24 07/05/2018   TSH 1.58 12/04/2017   HGBA1C 7.5 (A) 01/13/2019   MICROALBUR 1.4 05/22/2017    Dg Chest 2 View  Result Date: 06/14/2018 CLINICAL DATA:  63 year old male with fever chills and cough EXAM: CHEST - 2 VIEW COMPARISON:  06/13/2013 FINDINGS: Cardiomediastinal silhouette unchanged. No evidence of central vascular congestion. No pneumothorax or pleural effusion. No confluent airspace disease. No pleural effusion. No pneumothorax. No displaced fracture.  Degenerative changes of the spine IMPRESSION: Negative for acute cardiopulmonary disease Electronically Signed   By: Corrie Mckusick D.O.   On: 06/14/2018 10:56     Assessment & Plan:  Plan  I am having Alex Rocha maintain his Insulin Syringe-Needle U-100, diclofenac sodium, fluticasone, albuterol, azelastine, loratadine, levothyroxine, Accu-Chek SmartView, losartan, fenofibrate, meloxicam, atorvastatin, and  insulin NPH Human.  No orders of the defined types were placed in this encounter.   Problem List Items Addressed This Visit      Unprioritized   DM (diabetes mellitus) type II uncontrolled, periph vascular disorder ([place])    Per endo      HTN (hypertension)   Relevant Orders   Lipid panel   Comprehensive metabolic panel   TSH   Hyperlipidemia LDL goal <70    Tolerating statin, encouraged heart healthy diet, avoid trans fats, minimize simple carbs and saturated fats. Increase exercise as tolerated  Hypothyroidism following radioiodine therapy    Check labs con't meds       Other Visit Diagnoses    Hypothyroidism, unspecified type    -  Primary   Relevant Orders   Lipid panel   Comprehensive metabolic panel   TSH   Hyperlipidemia associated with type 2 diabetes mellitus ([place])       Relevant Orders   Lipid panel   Comprehensive metabolic panel   TSH      Follow-up: Return in about 6 months (around 08/08/2019), or if symptoms worsen or fail to improve, for annual exam, fasting.  Ann Held, DO

## 2019-02-07 NOTE — Assessment & Plan Note (Signed)
Check labs con't meds 

## 2019-02-07 NOTE — Assessment & Plan Note (Signed)
Per endo °

## 2019-02-07 NOTE — Assessment & Plan Note (Signed)
Tolerating statin, encouraged heart healthy diet, avoid trans fats, minimize simple carbs and saturated fats. Increase exercise as tolerated 

## 2019-02-11 ENCOUNTER — Other Ambulatory Visit: Payer: Self-pay | Admitting: Endocrinology

## 2019-02-12 ENCOUNTER — Other Ambulatory Visit: Payer: Self-pay | Admitting: Family Medicine

## 2019-02-12 DIAGNOSIS — N289 Disorder of kidney and ureter, unspecified: Secondary | ICD-10-CM

## 2019-02-19 ENCOUNTER — Other Ambulatory Visit: Payer: Self-pay | Admitting: Family Medicine

## 2019-04-10 ENCOUNTER — Other Ambulatory Visit: Payer: Self-pay | Admitting: Family Medicine

## 2019-04-10 DIAGNOSIS — E890 Postprocedural hypothyroidism: Secondary | ICD-10-CM

## 2019-04-18 ENCOUNTER — Other Ambulatory Visit: Payer: Self-pay | Admitting: Family Medicine

## 2019-04-23 ENCOUNTER — Ambulatory Visit (INDEPENDENT_AMBULATORY_CARE_PROVIDER_SITE_OTHER): Payer: 59 | Admitting: Primary Care

## 2019-04-23 ENCOUNTER — Encounter: Payer: Self-pay | Admitting: Primary Care

## 2019-04-23 DIAGNOSIS — G4733 Obstructive sleep apnea (adult) (pediatric): Secondary | ICD-10-CM | POA: Diagnosis not present

## 2019-04-23 DIAGNOSIS — J209 Acute bronchitis, unspecified: Secondary | ICD-10-CM

## 2019-04-23 NOTE — Patient Instructions (Addendum)
Pleasure speaking with you today Alex Rocha  Recommendations: - Continue to wear CPAP every night for 4-6 hours or more - Do not drive if experiencing excessive daytime fatigue or somnolence  - We will decrease pressure a little, if having issues with new pressure setting let our office know  Orders: - Referral DME- renew CPAP order, mask, supplies - Change pressure 10-18 cm h20 - Download in 4-6 weeks   Follow-up: -1 year with Dr. Mortimer Fries or S[facility]    CPAP and BPAP Information CPAP and BPAP are methods of helping a person breathe with the use of air pressure. CPAP stands for "continuous positive airway pressure." BPAP stands for "bi-level positive airway pressure." In both methods, air is blown through your nose or mouth and into your air passages to help you breathe well. CPAP and BPAP use different amounts of pressure to blow air. With CPAP, the amount of pressure stays the same while you breathe in and out. With BPAP, the amount of pressure is increased when you breathe in (inhale) so that you can take larger breaths. Your health care provider will recommend whether CPAP or BPAP would be more helpful for you. Why are CPAP and BPAP treatments used? CPAP or BPAP can be helpful if you have:  Sleep apnea.  Chronic obstructive pulmonary disease (COPD).  Heart failure.  Medical conditions that weaken the muscles of the chest including muscular dystrophy, or neurological diseases such as amyotrophic lateral sclerosis (ALS).  Other problems that cause breathing to be weak, abnormal, or difficult. CPAP is most commonly used for obstructive sleep apnea (OSA) to keep the airways from collapsing when the muscles relax during sleep. How is CPAP or BPAP administered? Both CPAP and BPAP are provided by a small machine with a flexible plastic tube that attaches to a plastic mask. You wear the mask. Air is blown through the mask into your nose or mouth. The amount of pressure that is used to blow the  air can be adjusted on the machine. Your health care provider will determine the pressure setting that should be used based on your individual needs. When should CPAP or BPAP be used? In most cases, the mask only needs to be worn during sleep. Generally, the mask needs to be worn throughout the night and during any daytime naps. People with certain medical conditions may also need to wear the mask at other times when they are awake. Follow instructions from your health care provider about when to use the machine. What are some tips for using the mask?   Because the mask needs to be snug, some people feel trapped or closed-in (claustrophobic) when first using the mask. If you feel this way, you may need to get used to the mask. One way to do this is by holding the mask loosely over your nose or mouth and then gradually applying the mask more snugly. You can also gradually increase the amount of time that you use the mask.  Masks are available in various types and sizes. Some fit over your mouth and nose while others fit over just your nose. If your mask does not fit well, talk with your health care provider about getting a different one.  If you are using a mask that fits over your nose and you tend to breathe through your mouth, a chin strap may be applied to help keep your mouth closed.  The CPAP and BPAP machines have alarms that may sound if the mask comes off  or develops a leak.  If you have trouble with the mask, it is very important that you talk with your health care provider about finding a way to make the mask easier to tolerate. Do not stop using the mask. Stopping the use of the mask could have a negative impact on your health. What are some tips for using the machine?  Place your CPAP or BPAP machine on a secure table or stand near an electrical outlet.  Know where the on/off switch is located on the machine.  Follow instructions from your health care provider about how to set the  pressure on your machine and when you should use it.  Do not eat or drink while the CPAP or BPAP machine is on. Food or fluids could get pushed into your lungs by the pressure of the CPAP or BPAP.  Do not smoke. Tobacco smoke residue can damage the machine.  For home use, CPAP and BPAP machines can be rented or purchased through home health care companies. Many different brands of machines are available. Renting a machine before purchasing may help you find out which particular machine works well for you.  Keep the CPAP or BPAP machine and attachments clean. Ask your health care provider for specific instructions. Get help right away if:  You have redness or open areas around your nose or mouth where the mask fits.  You have trouble using the CPAP or BPAP machine.  You cannot tolerate wearing the CPAP or BPAP mask.  You have pain, discomfort, and bloating in your abdomen. Summary  CPAP and BPAP are methods of helping a person breathe with the use of air pressure.  Both CPAP and BPAP are provided by a small machine with a flexible plastic tube that attaches to a plastic mask.  If you have trouble with the mask, it is very important that you talk with your health care provider about finding a way to make the mask easier to tolerate. This information is not intended to replace advice given to you by your health care provider. Make sure you discuss any questions you have with your health care provider. Document Revised: 07/24/2018 Document Reviewed: 02/21/2016 Elsevier Patient Education  [place].

## 2019-04-23 NOTE — Progress Notes (Signed)
Virtual Visit via Telephone Note  I connected with Alex Rocha on 04/23/19 at 10:30 AM EST by telephone and verified that I am speaking with the correct person using two identifiers.  Location: Patient: Home (car phone) Provider: [facility]    I discussed the limitations, risks, security and privacy concerns of performing an evaluation and management service by telephone and the availability of in person appointments. I also discussed with the patient that there may be a patient responsible charge related to this service. The patient expressed understanding and agreed to proceed.   History of Present Illness: 64 year old male, never smoked. PMH severe OSA, hypertension, DM type 2, sinusitis, GERD. Patient of Dr. Ashby Dawes, last seen on 04/25/18. Home sleep study 12/28/15 >>AHI 86.5, SaO2 low 66. Maintained on auto CPAP 10-20cm H20.    04/23/2019 Patient contacted today for annual OSA follow-up. He is complaint with CPAP use. Feels pressure at times is too much. No issue with mask. He reports sleeping well at night. He wakes up twice a night to use the rest room. He feels rested in the morning, no daytime fatigue.   Airview download 03/22/19-04/20/19: 30/30 days used; 100%>4 hours Pressure 10-20cm h20 (MEDIAN- 13.2) Airleaks- 41.8L/min (95%) AHI 2.3  Observations/Objective:  - No shortness of breath, wheezing or cough  Assessment and Plan:  Severe OSA on CPAP: - Compliant 100% with cpap and reports benefit from use  - Decreased pressure 10-18cm H20 - Continue to wear CPAP every night for 4-6 hours or more - Advised not to drive if experiencing excessive daytime fatigue or somnolence  - Renew CPAP order and supplies with DME   Follow Up Instructions:  -FU in 1 year with Dr. Mortimer Fries or Halford Chessman    I discussed the assessment and treatment plan with the patient. The patient was provided an opportunity to ask questions and all were answered. The patient agreed with the plan and  demonstrated an understanding of the instructions.   The patient was advised to call back or seek an in-person evaluation if the symptoms worsen or if the condition fails to improve as anticipated.  I provided 15 minutes of non-face-to-face time during this encounter.   Martyn Ehrich, NP

## 2019-04-24 NOTE — Progress Notes (Signed)
Reviewed and agree with assessment/plan.   Lashayla Armes, MD [facility] 04/12/2016, 12:24 PM Pager:  [phone]  

## 2019-05-03 ENCOUNTER — Other Ambulatory Visit: Payer: Self-pay | Admitting: Endocrinology

## 2019-05-15 ENCOUNTER — Other Ambulatory Visit: Payer: Self-pay

## 2019-05-15 ENCOUNTER — Ambulatory Visit (INDEPENDENT_AMBULATORY_CARE_PROVIDER_SITE_OTHER): Payer: 59 | Admitting: Endocrinology

## 2019-05-15 ENCOUNTER — Telehealth: Payer: Self-pay | Admitting: Primary Care

## 2019-05-15 ENCOUNTER — Encounter: Payer: Self-pay | Admitting: Endocrinology

## 2019-05-15 DIAGNOSIS — E119 Type 2 diabetes mellitus without complications: Secondary | ICD-10-CM | POA: Diagnosis not present

## 2019-05-15 DIAGNOSIS — Z794 Long term (current) use of insulin: Secondary | ICD-10-CM

## 2019-05-15 DIAGNOSIS — E890 Postprocedural hypothyroidism: Secondary | ICD-10-CM | POA: Diagnosis not present

## 2019-05-15 DIAGNOSIS — G4733 Obstructive sleep apnea (adult) (pediatric): Secondary | ICD-10-CM

## 2019-05-15 NOTE — Patient Instructions (Addendum)
Please have your blood checked at Dr L-C's office.  Please call ahead, so they can expect you. check your blood sugar twice a day.  vary the time of day when you check, between before the 3 meals, and at bedtime.  also check if you have symptoms of your blood sugar being too high or too low.  please keep a record of the readings and bring it to your next appointment here.  You can write it on any piece of paper.  please call us sooner if your blood sugar goes below 70, or if you have a lot of readings over 200.   On this type of insulin schedule, you should eat meals on a regular schedule (especially lunch).  If a meal is missed or significantly delayed, your blood sugar could go low.   Please come back for a follow-up appointment in 4 months.

## 2019-05-15 NOTE — Progress Notes (Signed)
Subjective:    Patient ID: Alex Rocha, male    DOB: 05-10-1955, 64 y.o.   MRN: [id number]  HPI  telehealth visit today via phone x 6 minutes Alternatives to telehealth are presented to this patient, and the patient agrees to the telehealth visit.  Pt is advised of the cost of the visit, and agrees to this, also.   Patient is at home, and I am at the office.   Persons attending the telehealth visit: the patient and I Pt returns for f/u of diabetes mellitus: DM type: Insulin-requiring type 2. Dx'ed: 2000.  Complications: polyneuropathy and renal insuff.   Therapy: insulin since 2014.  DKA: never.  Severe hypoglycemia: once, in 2018 Pancreatitis: never.   SDOH: he says his need to care for his wife, who has cancer, limits his ability to care for himself;  he takes human insulin, due to cost.   Other: he declines multiple daily injections; he declines weight-loss surgery.   Interval history: no cbg record, but states cbg's vary from 57-200.  it is lowest at lunch.  No recent steroids.   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   Past Medical History:  Diagnosis Date  . Colon polyps   . Diabetes mellitus   . Environmental allergies   . Hyperlipidemia   . Hypertension   . Hyperthyroidism   . Kidney stones   . Low testosterone     No past surgical history on file.  Social History   Socioeconomic History  . Marital status: Married    Spouse name: Not on file  . Number of children: 2  . Years of education: Not on file  . Highest education level: Not on file  Occupational History  . Occupation: Nurse, adult  Tobacco Use  . Smoking status: Never Smoker  . Smokeless tobacco: Never Used  Substance and Sexual Activity  . Alcohol use: Yes    Alcohol/week: 0.0 standard drinks    Comment: social  . Drug use: No  . Sexual activity: Not on file   Other Topics Concern  . Not on file  Social History Narrative  . Not on file   Social Determinants of Health   Financial Resource Strain:   . Difficulty of Paying Living Expenses: Not on file  Food Insecurity:   . Worried About Charity fundraiser in the Last Year: Not on file  . Ran Out of Food in the Last Year: Not on file  Transportation Needs:   . Lack of Transportation (Medical): Not on file  . Lack of Transportation (Non-Medical): Not on file  Physical Activity:   . Days of Exercise per Week: Not on file  . Minutes of Exercise per Session: Not on file  Stress:   . Feeling of Stress : Not on file  Social Connections:   . Frequency of Communication with Friends and Family: Not on file  . Frequency of Social Gatherings with Friends and Family: Not on file  . Attends Religious Services: Not on file  . Active Member of Clubs or Organizations: Not on file  . Attends Archivist Meetings: Not on file  . Marital Status: Not on file  Intimate Partner Violence:   . Fear of Current or Ex-Partner: Not on file  . Emotionally Abused: Not on file  . Physically Abused: Not on file  .  Sexually Abused: Not on file    Current Outpatient Medications on File Prior to Visit  Medication Sig Dispense Refill  . BD INSULIN SYRINGE U/F 31G X 5/16" 1 ML MISC 1 EACH BY OTHER ROUTE 2 (TWO) TIMES DAILY. AS DIRECTED 180 each 3  . diclofenac sodium (VOLTAREN) 1 % GEL APPLY 4 G TOPICALLY 4 (FOUR) TIMES DAILY. 100 g 1  . fenofibrate 160 MG tablet TAKE 1 TABLET BY MOUTH EVERY DAY 30 tablet 5  . glucose blood (ACCU-CHEK SMARTVIEW) test strip 1 each by Other route 2 (two) times a day. 100 each 12  . insulin NPH Human (HUMULIN N) 100 UNIT/ML injection INJECT 130 UNITS EACH MORNING AND 90 UNITS EACH EVENING 70 mL 13  . levothyroxine (SYNTHROID) 100 MCG tablet TAKE 1 TABLET BY MOUTH ONCE DAILY BEFORE BREAKFAST 30 tablet 5  . loratadine (CLARITIN) 10 MG tablet Take 1 tablet (10 mg total) by mouth  daily. 30 tablet 11  . losartan (COZAAR) 100 MG tablet TAKE 1 TABLET BY MOUTH EVERY DAY 30 tablet 5   No current facility-administered medications on file prior to visit.    Allergies  Allergen Reactions  . [name] [Yohimbine] Palpitations    Family History  Problem Relation Age of Onset  . Colon cancer Mother   . Heart disease Mother   . Hypertension Mother        Mother's side of the family  . Diabetes Mother        Mother's side of the family  . Colon cancer Brother   . Hyperlipidemia Brother        Mother's side of the family  . Heart disease Sister   . Diabetes Other        father's family    There were no vitals taken for this visit.  Review of Systems Denies LOC    Objective:   Physical Exam      Assessment & Plan:  Insulin-requiring type 2 DM: uncertain glycemic control.  Hypoglycemia: this limits aggressiveness of glycemic control.   Patient Instructions  Please have your blood checked at Dr L-C's office.  Please call ahead, so they can expect you. check your blood sugar twice a day.  vary the time of day when you check, between before the 3 meals, and at bedtime.  also check if you have symptoms of your blood sugar being too high or too low.  please keep a record of the readings and bring it to your next appointment here.  You can write it on any piece of paper.  please call us sooner if your blood sugar goes below 70, or if you have a lot of readings over 200.   On this type of insulin schedule, you should eat meals on a regular schedule (especially lunch).  If a meal is missed or significantly delayed, your blood sugar could go low.   Please come back for a follow-up appointment in 4 months.

## 2019-05-15 NOTE — Telephone Encounter (Signed)
Called and spoke to pt, who stated that his cpap machine is malfunctioning. Pt stated that he contacted [facility] and was advised that he could either replace his machine or send his current in for repair.  Pt is requesting a new Rx to be sent to [facility].   Beth, please advise if okay to send for new Rx? Thanks

## 2019-05-15 NOTE — Telephone Encounter (Signed)
Lm for pt

## 2019-05-16 NOTE — Telephone Encounter (Signed)
Placed new order for cpap machine to [facility]. Called and made patient aware. He verbalized his understanding. Nothing further needed.

## 2019-05-16 NOTE — Telephone Encounter (Signed)
Yes we can place a new order

## 2019-05-19 ENCOUNTER — Telehealth: Payer: Self-pay | Admitting: Primary Care

## 2019-05-19 NOTE — Telephone Encounter (Signed)
Pt called back and said that he spoke w/ [facility] about his cpap machine and was told that they may not be able to give him a new one because his current one may not be 62 years old. They told him it could be sent in for repairs but can be a 90 day turn around time and he can not go that long w/o sleep. Cb#: [phone]

## 2019-05-19 NOTE — Telephone Encounter (Signed)
Will route to [place], to see if a loaner can be provided while cpap is being repaired.

## 2019-05-19 NOTE — Telephone Encounter (Signed)
Called and spoke to pt, who is questioning if his cpap machine will be delivered to pick or if he will have to pick up and which location?  I have provided pt with [facility]'s contact number as they will be able to better assist him with these questions.  Nothing further is needed.

## 2019-05-19 NOTE — Telephone Encounter (Signed)
Called [facility] and spoke with Manuela Schwartz.  Per Manuela Schwartz at [facility] pt had appointment there on 05/16/2019, however, pt canceled appointment. No DME company will provide loaner.  Called patient back to advise that a loaner is not possible through DME.   Pt stated that they advised him that he could lease a CPAP, advised patient that would be up to the DME and they would provide the cost for that.  Manuela Schwartz with [facility] advised patient that he could order one off line if he wanted to do that option and she would be glad to set the pressure for him.   Pt asked for Rx for CPAP. I printed off order from 05/16/2019 along with the NPI page and placed up front for patient to p/u tomorrow.  Nothing else needed at this time. Rhonda J Cobb

## 2019-05-27 ENCOUNTER — Telehealth: Payer: Self-pay | Admitting: Primary Care

## 2019-05-27 NOTE — Telephone Encounter (Signed)
Called and spoke to pt, who is calling for update on cpap replacement machine.  Per 05/19/2019 phone note, pt requested a written Rx to purchase machine out of pocket. Rx was placed up front and picked up by pt. Per this same encounter, it appears that insurance will not cover a new cpap machine until 11/2019 or pt could send his current machine in for repair. Pt did not wish to send his current machine in for repair, as he would not be provided with a loaner. I relayed all of this information to pt once more and pt stated that he would like to purchase new cpap offline.  Nothing further is needed at this time.

## 2019-05-27 NOTE — Telephone Encounter (Signed)
Pt calling about getting his cpap replaced. He stated he has been without his cpap for 2 weeks. Pt can be reached at [phone].

## 2019-05-29 ENCOUNTER — Other Ambulatory Visit: Payer: Self-pay

## 2019-05-29 ENCOUNTER — Other Ambulatory Visit (INDEPENDENT_AMBULATORY_CARE_PROVIDER_SITE_OTHER): Payer: 59

## 2019-05-29 DIAGNOSIS — Z794 Long term (current) use of insulin: Secondary | ICD-10-CM | POA: Diagnosis not present

## 2019-05-29 DIAGNOSIS — E119 Type 2 diabetes mellitus without complications: Secondary | ICD-10-CM | POA: Diagnosis not present

## 2019-05-29 LAB — TSH: TSH: 3.62 u[IU]/mL (ref 0.35–4.50)

## 2019-05-29 LAB — BASIC METABOLIC PANEL
BUN: 39 mg/dL — ABNORMAL HIGH (ref 6–23)
CO2: 23 mEq/L (ref 19–32)
Calcium: 9.3 mg/dL (ref 8.4–10.5)
Chloride: 107 mEq/L (ref 96–112)
Creatinine, Ser: 1.84 mg/dL — ABNORMAL HIGH (ref 0.40–1.50)
GFR: 45.42 mL/min — ABNORMAL LOW (ref >60.00)
Glucose, Bld: 134 mg/dL — ABNORMAL HIGH (ref 70–99)
Potassium: 5 mEq/L (ref 3.5–5.1)
Sodium: 137 mEq/L (ref 135–145)

## 2019-05-29 LAB — HEMOGLOBIN A1C: Hgb A1c MFr Bld: 6.6 % — ABNORMAL HIGH (ref 4.6–6.5)

## 2019-06-22 ENCOUNTER — Other Ambulatory Visit: Payer: Self-pay | Admitting: Family Medicine

## 2019-07-03 ENCOUNTER — Other Ambulatory Visit: Payer: Self-pay | Admitting: Urology

## 2019-07-03 DIAGNOSIS — R972 Elevated prostate specific antigen [PSA]: Secondary | ICD-10-CM

## 2019-08-01 ENCOUNTER — Other Ambulatory Visit: Payer: Self-pay

## 2019-08-01 ENCOUNTER — Encounter (HOSPITAL_BASED_OUTPATIENT_CLINIC_OR_DEPARTMENT_OTHER): Payer: Self-pay | Admitting: *Deleted

## 2019-08-01 ENCOUNTER — Emergency Department (HOSPITAL_BASED_OUTPATIENT_CLINIC_OR_DEPARTMENT_OTHER)
Admission: EM | Admit: 2019-08-01 | Discharge: 2019-08-01 | Disposition: A | Discharge status: Home / Self Care | Payer: 59 | Attending: Emergency Medicine | Admitting: Emergency Medicine

## 2019-08-01 DIAGNOSIS — R6883 Chills (without fever): Secondary | ICD-10-CM | POA: Diagnosis present

## 2019-08-01 DIAGNOSIS — Z79899 Other long term (current) drug therapy: Secondary | ICD-10-CM | POA: Insufficient documentation

## 2019-08-01 DIAGNOSIS — E119 Type 2 diabetes mellitus without complications: Secondary | ICD-10-CM | POA: Diagnosis not present

## 2019-08-01 DIAGNOSIS — Z794 Long term (current) use of insulin: Secondary | ICD-10-CM | POA: Insufficient documentation

## 2019-08-01 DIAGNOSIS — I10 Essential (primary) hypertension: Secondary | ICD-10-CM | POA: Insufficient documentation

## 2019-08-01 DIAGNOSIS — N3000 Acute cystitis without hematuria: Secondary | ICD-10-CM | POA: Diagnosis not present

## 2019-08-01 LAB — BASIC METABOLIC PANEL
Anion gap: 8 (ref 5–15)
BUN: 40 mg/dL — ABNORMAL HIGH (ref 8–23)
CO2: 22 mmol/L (ref 22–32)
Calcium: 9.2 mg/dL (ref 8.9–10.3)
Chloride: 107 mmol/L (ref 98–111)
Creatinine, Ser: 1.91 mg/dL — ABNORMAL HIGH (ref 0.61–1.24)
GFR calc Af Amer: 43 mL/min — ABNORMAL LOW (ref >60)
GFR calc non Af Amer: 37 mL/min — ABNORMAL LOW (ref >60)
Glucose, Bld: 166 mg/dL — ABNORMAL HIGH (ref 70–99)
Potassium: 4.5 mmol/L (ref 3.5–5.1)
Sodium: 137 mmol/L (ref 135–145)

## 2019-08-01 LAB — CBC WITH DIFFERENTIAL/PLATELET
Abs Immature Granulocytes: 0.17 10*3/uL — ABNORMAL HIGH (ref 0.00–0.07)
Basophils Absolute: 0 10*3/uL (ref 0.0–0.1)
Basophils Relative: 0 %
Eosinophils Absolute: 0 10*3/uL (ref 0.0–0.5)
Eosinophils Relative: 0 %
HCT: 35.3 % — ABNORMAL LOW (ref 39.0–52.0)
Hemoglobin: 11.4 g/dL — ABNORMAL LOW (ref 13.0–17.0)
Immature Granulocytes: 1 %
Lymphocytes Relative: 10 %
Lymphs Abs: 1.9 10*3/uL (ref 0.7–4.0)
MCH: 31.1 pg (ref 26.0–34.0)
MCHC: 32.3 g/dL (ref 30.0–36.0)
MCV: 96.4 fL (ref 80.0–100.0)
Monocytes Absolute: 1.6 10*3/uL — ABNORMAL HIGH (ref 0.1–1.0)
Monocytes Relative: 9 %
Neutro Abs: 14.5 10*3/uL — ABNORMAL HIGH (ref 1.7–7.7)
Neutrophils Relative %: 80 %
Platelets: 240 10*3/uL (ref 150–400)
RBC: 3.66 MIL/uL — ABNORMAL LOW (ref 4.22–5.81)
RDW: 15.5 % (ref 11.5–15.5)
WBC: 18.2 10*3/uL — ABNORMAL HIGH (ref 4.0–10.5)
nRBC: 0 % (ref 0.0–0.2)

## 2019-08-01 LAB — URINALYSIS, ROUTINE W REFLEX MICROSCOPIC
Bilirubin Urine: NEGATIVE
Glucose, UA: NEGATIVE mg/dL
Hgb urine dipstick: NEGATIVE
Ketones, ur: NEGATIVE mg/dL
Nitrite: NEGATIVE
Protein, ur: NEGATIVE mg/dL
Specific Gravity, Urine: 1.01 (ref 1.005–1.030)
pH: 6 (ref 5.0–8.0)

## 2019-08-01 LAB — URINALYSIS, MICROSCOPIC (REFLEX): WBC, UA: >50 WBC/hpf (ref 0–5)

## 2019-08-01 MED ORDER — CEPHALEXIN 500 MG PO CAPS: 500.0000 mg | ORAL_CAPSULE | Freq: Three times a day (TID) | ORAL | 0 refills | Status: AC

## 2019-08-01 MED ORDER — SODIUM CHLORIDE 0.9 % IV SOLN
1.0000 g | Freq: Once | INTRAVENOUS | Status: AC
  Administered 2019-08-01: 1 g via INTRAVENOUS
  Filled 2019-08-01: qty 10

## 2019-08-01 NOTE — ED Triage Notes (Signed)
Pt is here with c.o fever, chills, bodyaches, sob, and fatigue since yesterday.

## 2019-08-01 NOTE — ED Notes (Signed)
ED Provider at bedside. 

## 2019-08-01 NOTE — ED Provider Notes (Signed)
[name]  CSN: [id number] Arrival date & time: 08/01/19 1700    History Chief Complaint  Patient presents with  . Dysuria    HPI  Alex Rocha is a 64 y.o. male reports he was feeling generally ill after work yesterday afternoon. He began having some chills as he went to bed but did not check his temp. He states he had to wake up to urinate about 8 times during the night. No pain with urination, but he did have a feeling of incomplete bladder emptying. Denies penile discharge, back pain or pelvic pain. No pain with bowel movements. He had some nausea, no vomiting. Some mild SOB but no cough or congestion. No prior history of similar symptoms. No sick contacts.    Past Medical History:  Diagnosis Date  . Colon polyps   . Diabetes mellitus   . Environmental allergies   . Hyperlipidemia   . Hypertension   . Hyperthyroidism   . Kidney stones   . Low testosterone     History reviewed. No pertinent surgical history.  Family History  Problem Relation Age of Onset  . Colon cancer Mother   . Heart disease Mother   . Hypertension Mother        Mother's side of the family  . Diabetes Mother        Mother's side of the family  . Colon cancer Brother   . Hyperlipidemia Brother        Mother's side of the family  . Heart disease Sister   . Diabetes Other        father's family    Social History   Tobacco Use  . Smoking status: Never Smoker  . Smokeless tobacco: Never Used  Substance Use Topics  . Alcohol use: Yes    Alcohol/week: 0.0 standard drinks    Comment: social  . Drug use: No     Home Medications Prior to Admission medications   Medication Sig Start Date End Date Taking? Authorizing Provider  BD INSULIN SYRINGE U/F 31G X 5/16" 1 ML MISC 1 EACH BY OTHER ROUTE 2 (TWO) TIMES DAILY. AS DIRECTED 05/03/19  Yes Renato Shin, MD  diclofenac sodium (VOLTAREN) 1 % GEL APPLY 4 G TOPICALLY 4 (FOUR) TIMES DAILY.  03/04/18  Yes Roma Schanz R, DO  fenofibrate 160 MG tablet TAKE 1 TABLET BY MOUTH EVERY DAY 06/23/19  Yes Roma Schanz R, DO  glucose blood (ACCU-CHEK SMARTVIEW) test strip 1 each by Other route 2 (two) times a day. 10/28/18  Yes Renato Shin, MD  insulin NPH Human (HUMULIN N) 100 UNIT/ML injection INJECT 130 UNITS EACH MORNING AND 90 UNITS EACH EVENING 01/30/19  Yes Renato Shin, MD  levothyroxine (SYNTHROID) 100 MCG tablet TAKE 1 TABLET BY MOUTH ONCE DAILY BEFORE BREAKFAST 04/10/19  Yes Roma Schanz R, DO  losartan (COZAAR) 100 MG tablet TAKE 1 TABLET BY MOUTH EVERY DAY 04/22/19  Yes Roma Schanz R, DO  cephALEXin (KEFLEX) 500 MG capsule Take 1 capsule (500 mg total) by mouth 3 (three) times daily for 7 days. 08/01/19 08/08/19  Truddie Hidden, MD  loratadine (CLARITIN) 10 MG tablet Take 1 tablet (10 mg total) by mouth daily. 08/16/18   Ann Held, DO     Allergies    [name] [yohimbine]   Review of Systems   Review of Systems  Constitutional: Negative for fever.  HENT: Negative for congestion and sore throat.  Respiratory: Negative for cough and shortness of breath.   Cardiovascular: Negative for chest pain.  Gastrointestinal: Positive for nausea. Negative for abdominal pain, diarrhea and vomiting.  Genitourinary: Positive for dysuria and frequency. Negative for discharge.  Musculoskeletal: Negative for myalgias.  Skin: Negative for rash.  Neurological: Negative for headaches.  Psychiatric/Behavioral: Negative for behavioral problems.     Physical Exam BP (!) 136/59   Pulse 88   Temp 98.8 F (37.1 C) (Oral)   Resp 20   Ht 6' 1.5" (1.867 m)   Wt (!) 140.6 kg   SpO2 97%   BMI 40.35 kg/m   Physical Exam Vitals and nursing note reviewed.  Constitutional:      Appearance: Normal appearance.  HENT:     Head: Normocephalic and atraumatic.     Nose: Nose normal.     Mouth/Throat:     Mouth: Mucous membranes are moist.  Eyes:      Extraocular Movements: Extraocular movements intact.     Conjunctiva/sclera: Conjunctivae normal.  Cardiovascular:     Rate and Rhythm: Normal rate.  Pulmonary:     Effort: Pulmonary effort is normal.     Breath sounds: Normal breath sounds.  Abdominal:     General: Abdomen is flat.     Palpations: Abdomen is soft.     Tenderness: There is no abdominal tenderness.  Musculoskeletal:        General: No swelling. Normal range of motion.     Cervical back: Neck supple.  Skin:    General: Skin is warm and dry.  Neurological:     General: No focal deficit present.     Mental Status: He is alert.  Psychiatric:        Mood and Affect: Mood normal.      ED Results / Procedures / Treatments   Labs (all labs ordered are listed, but only abnormal results are displayed) Labs Reviewed  BASIC METABOLIC PANEL - Abnormal; Notable for the following components:      Result Value   Glucose, Bld 166 (*)    BUN 40 (*)    Creatinine, Ser 1.91 (*)    GFR calc non Af Amer 37 (*)    GFR calc Af Amer 43 (*)    All other components within normal limits  URINALYSIS, ROUTINE W REFLEX MICROSCOPIC - Abnormal; Notable for the following components:   APPearance CLOUDY (*)    Leukocytes,Ua SMALL (*)    All other components within normal limits  CBC WITH DIFFERENTIAL/PLATELET - Abnormal; Notable for the following components:   WBC 18.2 (*)    RBC 3.66 (*)    Hemoglobin 11.4 (*)    HCT 35.3 (*)    Neutro Abs 14.5 (*)    Monocytes Absolute 1.6 (*)    Abs Immature Granulocytes 0.17 (*)    All other components within normal limits  URINALYSIS, MICROSCOPIC (REFLEX) - Abnormal; Notable for the following components:   Bacteria, UA FEW (*)    All other components within normal limits  URINE CULTURE    EKG None   Radiology No results found.  Procedures Procedures  Medications Ordered in the ED Medications  cefTRIAXone (ROCEPHIN) 1 g in sodium chloride 0.9 % 100 mL IVPB (0 g Intravenous Stopped  08/01/19 1955)     ED Course  I have reviewed the triage vital signs and the nursing notes.  Pertinent labs & imaging results that were available during my care of the patient were reviewed by me and  considered in my medical decision making (see chart for details).  Clinical Course as of Jul 31 2005  Fri Aug 01, 2019  1919 CBC shows leukocytosis. BMP shows chronic kidney disease, not significantly changed from baseline. UA show UTI.    [CS]  2006 Patient feeling better after IV fluids and antibiotics.  He would like to go home.  He be discharged with prescription for Keflex and advised to follow-up with primary care physician or urologist next week for reassessment.  His urine will be sent for culture.   [CS]    Clinical Course User Index [CS] Truddie Hidden, MD    MDM Rules/Calculators/A&P MDM  Final Clinical Impression(s) / ED Diagnoses Final diagnoses:  Acute cystitis without hematuria    Rx / DC Orders ED Discharge Orders         Ordered    cephALEXin (KEFLEX) 500 MG capsule  3 times daily     08/01/19 2007           Truddie Hidden, MD 08/01/19 2007

## 2019-08-04 ENCOUNTER — Telehealth: Payer: Self-pay | Admitting: *Deleted

## 2019-08-04 LAB — URINE CULTURE: Culture: 50000 — AB

## 2019-08-04 NOTE — Telephone Encounter (Signed)
1st attempt. Unable to reach patient.   

## 2019-08-05 ENCOUNTER — Telehealth: Payer: Self-pay | Admitting: Emergency Medicine

## 2019-08-05 NOTE — Telephone Encounter (Signed)
Its ok to leave it as is

## 2019-08-05 NOTE — Telephone Encounter (Signed)
I have made two attempts and have been unable to reach patient. Pt has physical scheduled w/ PCP 08/08/19. Can switch visit to hospital follow up if okay w/ PCP.

## 2019-08-05 NOTE — Telephone Encounter (Signed)
Post ED Visit - Positive Culture Follow-up  Culture report reviewed by antimicrobial stewardship pharmacist: [facility] Team []  Elenor Quinones, Pharm.D. []  Heide Guile, Pharm.D., BCPS AQ-ID []  Parks Neptune, Pharm.D., BCPS []  Alycia Rossetti, Pharm.D., BCPS []  [place], Pharm.D., BCPS, AAHIVP []  Legrand Como, Pharm.D., BCPS, AAHIVP []  Salome Arnt, PharmD, BCPS []  Johnnette Gourd, PharmD, BCPS []  Hughes Better, PharmD, BCPS []  Leeroy Cha, PharmD []  Laqueta Linden, PharmD, BCPS []  Albertina Parr, PharmD  [facility] Team []  Leodis Sias, PharmD []  Lindell Spar, PharmD []  Royetta Asal, PharmD []  Graylin Shiver, Rph []  Rema Fendt) Glennon Mac, PharmD []  Arlyn Dunning, PharmD []  Netta Cedars, PharmD []  Dia Sitter, PharmD []  Leone Haven, PharmD []  Gretta Arab, PharmD []  Theodis Shove, PharmD []  Peggyann Juba, PharmD []  Reuel Boom, PharmD   Positive urine culture Treated with cephalexin, organism sensitive to the same and no further patient follow-up is required at this time.  Hazle Nordmann 08/05/2019, 11:49 AM

## 2019-08-07 ENCOUNTER — Other Ambulatory Visit: Payer: Self-pay

## 2019-08-08 ENCOUNTER — Ambulatory Visit (INDEPENDENT_AMBULATORY_CARE_PROVIDER_SITE_OTHER): Payer: 59 | Admitting: Family Medicine

## 2019-08-08 ENCOUNTER — Encounter: Payer: Self-pay | Admitting: Family Medicine

## 2019-08-08 VITALS — BP 132/78 | HR 75 | Temp 97.2°F | Resp 18 | Ht 73.5 in | Wt 309.4 lb

## 2019-08-08 DIAGNOSIS — E1151 Type 2 diabetes mellitus with diabetic peripheral angiopathy without gangrene: Secondary | ICD-10-CM

## 2019-08-08 DIAGNOSIS — E1165 Type 2 diabetes mellitus with hyperglycemia: Secondary | ICD-10-CM

## 2019-08-08 DIAGNOSIS — E1122 Type 2 diabetes mellitus with diabetic chronic kidney disease: Secondary | ICD-10-CM

## 2019-08-08 DIAGNOSIS — N183 Chronic kidney disease, stage 3 unspecified: Secondary | ICD-10-CM | POA: Diagnosis not present

## 2019-08-08 DIAGNOSIS — E890 Postprocedural hypothyroidism: Secondary | ICD-10-CM

## 2019-08-08 DIAGNOSIS — N390 Urinary tract infection, site not specified: Secondary | ICD-10-CM | POA: Diagnosis not present

## 2019-08-08 DIAGNOSIS — E039 Hypothyroidism, unspecified: Secondary | ICD-10-CM

## 2019-08-08 DIAGNOSIS — E785 Hyperlipidemia, unspecified: Secondary | ICD-10-CM

## 2019-08-08 DIAGNOSIS — R350 Frequency of micturition: Secondary | ICD-10-CM | POA: Diagnosis not present

## 2019-08-08 DIAGNOSIS — E1169 Type 2 diabetes mellitus with other specified complication: Secondary | ICD-10-CM

## 2019-08-08 DIAGNOSIS — IMO0002 Reserved for concepts with insufficient information to code with codable children: Secondary | ICD-10-CM

## 2019-08-08 DIAGNOSIS — I10 Essential (primary) hypertension: Secondary | ICD-10-CM

## 2019-08-08 DIAGNOSIS — N3001 Acute cystitis with hematuria: Secondary | ICD-10-CM

## 2019-08-08 LAB — POC URINALSYSI DIPSTICK (AUTOMATED)
Bilirubin, UA: NEGATIVE
Blood, UA: NEGATIVE
Glucose, UA: NEGATIVE
Ketones, UA: NEGATIVE
Leukocytes, UA: NEGATIVE
Nitrite, UA: NEGATIVE
Protein, UA: NEGATIVE
Spec Grav, UA: 1.02 (ref 1.010–1.025)
Urobilinogen, UA: 0.2 E.U./dL
pH, UA: 5.5 (ref 5.0–8.0)

## 2019-08-08 LAB — COMPREHENSIVE METABOLIC PANEL
ALT: 43 U/L (ref 0–53)
AST: 35 U/L (ref 0–37)
Albumin: 4.2 g/dL (ref 3.5–5.2)
Alkaline Phosphatase: 39 U/L (ref 39–117)
BUN: 35 mg/dL — ABNORMAL HIGH (ref 6–23)
CO2: 25 mEq/L (ref 19–32)
Calcium: 10 mg/dL (ref 8.4–10.5)
Chloride: 106 mEq/L (ref 96–112)
Creatinine, Ser: 1.52 mg/dL — ABNORMAL HIGH (ref 0.40–1.50)
GFR: 56.59 mL/min — ABNORMAL LOW (ref >60.00)
Glucose, Bld: 179 mg/dL — ABNORMAL HIGH (ref 70–99)
Potassium: 4.8 mEq/L (ref 3.5–5.1)
Sodium: 139 mEq/L (ref 135–145)
Total Bilirubin: 0.3 mg/dL (ref 0.2–1.2)
Total Protein: 6.8 g/dL (ref 6.0–8.3)

## 2019-08-08 LAB — PSA: PSA: 5.25 ng/mL — ABNORMAL HIGH (ref 0.10–4.00)

## 2019-08-08 LAB — TSH: TSH: 5.02 u[IU]/mL — ABNORMAL HIGH (ref 0.35–4.50)

## 2019-08-08 LAB — CBC WITH DIFFERENTIAL/PLATELET
Basophils Absolute: 0.1 10*3/uL (ref 0.0–0.1)
Basophils Relative: 1.2 % (ref 0.0–3.0)
Eosinophils Absolute: 0.2 10*3/uL (ref 0.0–0.7)
Eosinophils Relative: 2.8 % (ref 0.0–5.0)
HCT: 34.7 % — ABNORMAL LOW (ref 39.0–52.0)
Hemoglobin: 11.5 g/dL — ABNORMAL LOW (ref 13.0–17.0)
Lymphocytes Relative: 34.6 % (ref 12.0–46.0)
Lymphs Abs: 2.3 10*3/uL (ref 0.7–4.0)
MCHC: 33.2 g/dL (ref 30.0–36.0)
MCV: 93.1 fl (ref 78.0–100.0)
Monocytes Absolute: 0.6 10*3/uL (ref 0.1–1.0)
Monocytes Relative: 9.1 % (ref 3.0–12.0)
Neutro Abs: 3.5 10*3/uL (ref 1.4–7.7)
Neutrophils Relative %: 52.3 % (ref 43.0–77.0)
Platelets: 325 10*3/uL (ref 150.0–400.0)
RBC: 3.73 Mil/uL — ABNORMAL LOW (ref 4.22–5.81)
RDW: 15.5 % (ref 11.5–15.5)
WBC: 6.7 10*3/uL (ref 4.0–10.5)

## 2019-08-08 LAB — LIPID PANEL
Cholesterol: 154 mg/dL (ref 0–200)
HDL: 24.1 mg/dL — ABNORMAL LOW (ref >39.00)
LDL Cholesterol: 94 mg/dL (ref 0–99)
NonHDL: 129.4
Total CHOL/HDL Ratio: 6
Triglycerides: 179 mg/dL — ABNORMAL HIGH (ref 0.0–149.0)
VLDL: 35.8 mg/dL (ref 0.0–40.0)

## 2019-08-08 MED ORDER — CIPROFLOXACIN HCL 500 MG PO TABS: 500.0000 mg | ORAL_TABLET | Freq: Two times a day (BID) | ORAL | 0 refills | Status: DC

## 2019-08-08 NOTE — Assessment & Plan Note (Signed)
hgba1c acceptable, minimize simple carbs. Increase exercise as tolerated. Continue current meds 

## 2019-08-08 NOTE — Assessment & Plan Note (Signed)
Check labs con't meds 

## 2019-08-08 NOTE — Assessment & Plan Note (Signed)
Well controlled, no changes to meds. Encouraged heart healthy diet such as the DASH diet and exercise as tolerated.  °

## 2019-08-08 NOTE — Assessment & Plan Note (Signed)
Encouraged heart healthy diet, increase exercise, avoid trans fats, consider a krill oil cap daily 

## 2019-08-08 NOTE — Patient Instructions (Signed)

## 2019-08-08 NOTE — Progress Notes (Signed)
Patient ID: Alex Rocha, male    DOB: 29-May-1955  Age: 64 y.o. MRN: [id number]    Subjective:  Subjective  HPI SALAR MOLDEN presents for f/u er for uti He still has chills No dysuria, no fever    Review of Systems  Constitutional: Negative for appetite change, diaphoresis, fatigue and unexpected weight change.  Eyes: Negative for pain, redness and visual disturbance.  Respiratory: Negative for cough, chest tightness, shortness of breath and wheezing.   Cardiovascular: Negative for chest pain, palpitations and leg swelling.  Endocrine: Negative for cold intolerance, heat intolerance, polydipsia, polyphagia and polyuria.  Genitourinary: Positive for frequency. Negative for difficulty urinating and dysuria.  Neurological: Negative for dizziness, light-headedness, numbness and headaches.    History Past Medical History:  Diagnosis Date  . Colon polyps   . Diabetes mellitus   . Environmental allergies   . Hyperlipidemia   . Hypertension   . Hyperthyroidism   . Kidney stones   . Low testosterone     He has no past surgical history on file.   His family history includes Colon cancer in his brother and mother; Diabetes in his mother and another family member; Heart disease in his mother and sister; Hyperlipidemia in his brother; Hypertension in his mother.He reports that he has never smoked. He has never used smokeless tobacco. He reports current alcohol use. He reports that he does not use drugs.  Current Outpatient Medications on File Prior to Visit  Medication Sig Dispense Refill  . BD INSULIN SYRINGE U/F 31G X 5/16" 1 ML MISC 1 EACH BY OTHER ROUTE 2 (TWO) TIMES DAILY. AS DIRECTED 180 each 3  . cephALEXin (KEFLEX) 500 MG capsule Take 1 capsule (500 mg total) by mouth 3 (three) times daily for 7 days. 21 capsule 0  . diclofenac sodium (VOLTAREN) 1 % GEL APPLY 4 G TOPICALLY 4 (FOUR) TIMES DAILY. 100 g 1  . fenofibrate 160 MG tablet TAKE 1 TABLET BY MOUTH EVERY DAY 90  tablet 1  . glucose blood (ACCU-CHEK SMARTVIEW) test strip 1 each by Other route 2 (two) times a day. 100 each 12  . insulin NPH Human (HUMULIN N) 100 UNIT/ML injection INJECT 130 UNITS EACH MORNING AND 90 UNITS EACH EVENING 70 mL 13  . levothyroxine (SYNTHROID) 100 MCG tablet TAKE 1 TABLET BY MOUTH ONCE DAILY BEFORE BREAKFAST 30 tablet 5  . loratadine (CLARITIN) 10 MG tablet Take 1 tablet (10 mg total) by mouth daily. 30 tablet 11  . losartan (COZAAR) 100 MG tablet TAKE 1 TABLET BY MOUTH EVERY DAY 30 tablet 5   No current facility-administered medications on file prior to visit.     Objective:  Objective  Physical Exam Vitals and nursing note reviewed.  Constitutional:      General: He is sleeping.     Appearance: He is well-developed.  HENT:     Head: Normocephalic and atraumatic.  Eyes:     Pupils: Pupils are equal, round, and reactive to light.  Neck:     Thyroid: No thyromegaly.  Cardiovascular:     Rate and Rhythm: Normal rate and regular rhythm.     Heart sounds: No murmur.  Pulmonary:     Effort: Pulmonary effort is normal. No respiratory distress.     Breath sounds: Normal breath sounds. No wheezing or rales.  Chest:     Chest wall: No tenderness.  Abdominal:     General: There is no distension.     Tenderness: There is no  abdominal tenderness. There is no right CVA tenderness, left CVA tenderness, guarding or rebound.  Musculoskeletal:        General: No tenderness.     Cervical back: Normal range of motion and neck supple.  Skin:    General: Skin is warm and dry.  Neurological:     Mental Status: He is oriented to person, place, and time.  Psychiatric:        Behavior: Behavior normal.        Thought Content: Thought content normal.        Judgment: Judgment normal.    BP 132/78 (BP Location: Right Arm, Patient Position: Sitting, Cuff Size: Large)   Pulse 75   Temp (!) 97.2 F (36.2 C) (Temporal)   Resp 18   Ht 6' 1.5" (1.867 m)   Wt (!) 309 lb 6.4 oz  (140.3 kg)   SpO2 93%   BMI 40.27 kg/m  Wt Readings from Last 3 Encounters:  08/08/19 (!) 309 lb 6.4 oz (140.3 kg)  08/01/19 (!) 310 lb (140.6 kg)  02/07/19 (!) 319 lb 6.4 oz (144.9 kg)     Lab Results  Component Value Date   WBC 18.2 (H) 08/01/2019   HGB 11.4 (L) 08/01/2019   HCT 35.3 (L) 08/01/2019   PLT 240 08/01/2019   GLUCOSE 166 (H) 08/01/2019   CHOL 104 02/07/2019   TRIG 111.0 02/07/2019   HDL 25.40 (L) 02/07/2019   LDLDIRECT 73.0 12/04/2017   LDLCALC 57 02/07/2019   ALT 43 02/07/2019   AST 36 02/07/2019   NA 137 08/01/2019   K 4.5 08/01/2019   CL 107 08/01/2019   CREATININE 1.91 (H) 08/01/2019   BUN 40 (H) 08/01/2019   CO2 22 08/01/2019   TSH 3.62 05/29/2019   HGBA1C 6.6 (H) 05/29/2019   MICROALBUR 1.4 05/22/2017    No results found.   Assessment & Plan:  Plan  I am having Demarian L. Kelleher start on ciprofloxacin. I am also having him maintain his diclofenac sodium, loratadine, Accu-Chek SmartView, insulin NPH Human, levothyroxine, losartan, BD Insulin Syringe U/F, fenofibrate, and cephALEXin.  Meds ordered this encounter  Medications  . ciprofloxacin (CIPRO) 500 MG tablet    Sig: Take 1 tablet (500 mg total) by mouth 2 (two) times daily.    Dispense:  20 tablet    Refill:  0    Problem List Items Addressed This Visit      Unprioritized   DM (diabetes mellitus) type II uncontrolled, periph vascular disorder ([place])    hgba1c acceptable, minimize simple carbs. Increase exercise as tolerated. Continue current meds       HTN (hypertension)    Well controlled, no changes to meds. Encouraged heart healthy diet such as the DASH diet and exercise as tolerated.       Hyperlipidemia LDL goal <70    Encouraged heart healthy diet, increase exercise, avoid trans fats, consider a krill oil cap daily      Hypothyroidism following radioiodine therapy    Check labs con't meds      Urinary tract infection without hematuria    Er labs and visit  reviewed D/c keflex and start cipro  Recheck 2-3 weeks       Relevant Medications   ciprofloxacin (CIPRO) 500 MG tablet    Other Visit Diagnoses    Hematuria due to acute cystitis    -  Primary   Relevant Orders   POCT Urinalysis Dipstick (Automated) (Completed)   Urine Culture  CBC with Differential/Platelet   PSA   Controlled type 2 diabetes mellitus with other specified complication, unspecified whether long term insulin use ([place])       Controlled type 2 diabetes mellitus with stage 3 chronic kidney disease, unspecified whether long term insulin use ([facility])       Relevant Orders   Lipid panel   Comprehensive metabolic panel   Hypothyroidism, unspecified type       Relevant Orders   TSH   Urinary frequency       Relevant Orders   PSA      Follow-up: Return in about 3 weeks (around 08/29/2019), or if symptoms worsen or fail to improve, for recheck urine and reschedule cpe.  Ann Held, DO

## 2019-08-08 NOTE — Assessment & Plan Note (Signed)
Er labs and visit reviewed D/c keflex and start cipro  Recheck 2-3 weeks

## 2019-08-09 ENCOUNTER — Other Ambulatory Visit: Payer: Self-pay | Admitting: Family Medicine

## 2019-08-09 DIAGNOSIS — J069 Acute upper respiratory infection, unspecified: Secondary | ICD-10-CM

## 2019-08-09 LAB — URINE CULTURE
MICRO NUMBER:: 10399400
Result:: NO GROWTH
SPECIMEN QUALITY:: ADEQUATE

## 2019-08-10 ENCOUNTER — Other Ambulatory Visit: Payer: Self-pay | Admitting: Family Medicine

## 2019-08-10 DIAGNOSIS — E039 Hypothyroidism, unspecified: Secondary | ICD-10-CM

## 2019-08-12 ENCOUNTER — Other Ambulatory Visit: Payer: Self-pay

## 2019-08-12 MED ORDER — LEVOTHYROXINE SODIUM 112 MCG PO TABS: 112.0000 ug | ORAL_TABLET | Freq: Every day | ORAL | 2 refills | Status: DC

## 2019-08-18 ENCOUNTER — Ambulatory Visit: Payer: 59 | Admitting: Internal Medicine

## 2019-08-18 ENCOUNTER — Other Ambulatory Visit: Payer: Self-pay

## 2019-08-18 ENCOUNTER — Encounter: Payer: Self-pay | Admitting: Internal Medicine

## 2019-08-18 VITALS — BP 132/52 | HR 79 | Temp 98.0°F | Resp 18 | Ht 73.5 in | Wt 314.5 lb

## 2019-08-18 DIAGNOSIS — N390 Urinary tract infection, site not specified: Secondary | ICD-10-CM

## 2019-08-18 MED ORDER — CIPROFLOXACIN HCL 500 MG PO TABS: 500.0000 mg | ORAL_TABLET | Freq: Two times a day (BID) | ORAL | 0 refills | Status: DC

## 2019-08-18 NOTE — Patient Instructions (Signed)
Please go to the lab and provide a urine sample  Continue taking antibiotics, we sent 10 more days of ciprofloxacin  Drink plenty of fluids  If you are not gradually improving call the office.  If you have fever, chills, blood in the urine, increasing urinary frequency: Please let us know immediately.

## 2019-08-18 NOTE — Progress Notes (Signed)
Subjective:    Patient ID: Alex Rocha, male    DOB: 1955-05-16, 64 y.o.   MRN: [id number]  DOS:  08/18/2019 Type of visit - description: Follow-up  Went to the ER 08/01/2019, had some chills, had frequent urination, no dysuria. UA + for white cells, urine culture showed E. coli.  Kidney function was a slightly decreased with a creatinine of 1.91. At the ER he got Rocephin and was d/c home on keflex  Was seen by PCP 08/08/2019, labs were repeated, WBCs were improving, kidney function improving, abx switched toCipro. PSA was a slightly elevated at 5.2 UCX returned (-)  He is here today because for the last couple of days chills have returned and he has noted more urinary frequency. Denies fevers. No flank pain Mild left lower quadrant discomfort. No gross hematuria or dysuria No nausea, vomiting, diarrhea.    Review of Systems See above  Past Medical History:  Diagnosis Date  . Colon polyps   . Diabetes mellitus   . Environmental allergies   . Hyperlipidemia   . Hypertension   . Hyperthyroidism   . Kidney stones   . Low testosterone     No past surgical history on file.  Allergies as of 08/18/2019      Reactions   [name] [yohimbine] Palpitations      Medication List       Accurate as of Aug 18, 2019  3:37 PM. If you have any questions, ask your nurse or doctor.        Accu-Chek SmartView test strip Generic drug: glucose blood 1 each by Other route 2 (two) times a day.   BD Insulin Syringe U/F 31G X 5/16" 1 ML Misc Generic drug: Insulin Syringe-Needle U-100 1 EACH BY OTHER ROUTE 2 (TWO) TIMES DAILY. AS DIRECTED   ciprofloxacin 500 MG tablet Commonly known as: Cipro Take 1 tablet (500 mg total) by mouth 2 (two) times daily.   diclofenac sodium 1 % Gel Commonly known as: VOLTAREN APPLY 4 G TOPICALLY 4 (FOUR) TIMES DAILY.   fenofibrate 160 MG tablet TAKE 1 TABLET BY MOUTH EVERY DAY   insulin NPH Human 100 UNIT/ML injection Commonly known as: HumuLIN  N INJECT 130 UNITS EACH MORNING AND 90 UNITS EACH EVENING   levothyroxine 112 MCG tablet Commonly known as: SYNTHROID Take 1 tablet (112 mcg total) by mouth daily.   loratadine 10 MG tablet Commonly known as: CLARITIN TAKE 1 TABLET BY MOUTH EVERY DAY   losartan 100 MG tablet Commonly known as: COZAAR TAKE 1 TABLET BY MOUTH EVERY DAY          Objective:   Physical Exam BP (!) 132/52 (BP Location: Left Arm, Patient Position: Sitting, Cuff Size: Normal)   Pulse 79   Temp 98 F (36.7 C) (Temporal)   Resp 18   Ht 6' 1.5" (1.867 m)   Wt (!) 314 lb 8 oz (142.7 kg)   SpO2 97%   BMI 40.93 kg/m  General:   Well developed, NAD, BMI noted. HEENT:  Normocephalic . Face symmetric, atraumatic Lungs:  CTA B Normal respiratory effort, no intercostal retractions, no accessory muscle use. Heart: RRR,  no murmur.  Abdominal: Not distended, soft, no mass, minimal TTP if any at the distal LLQ, no CVA tenderness Lower extremities: no pretibial edema bilaterally  Skin: Not pale. Not jaundice Neurologic:  alert & oriented X3.  Speech normal, gait appropriate for age and unassisted Psych--  Cognition and judgment appear intact.  Cooperative  with normal attention span and concentration.  Behavior appropriate. No anxious or depressed appearing.      Assessment     64 year old gentleman, PMH includes DM, HTN, thyroid disease, elevated PSA follow-up by urology, prostate bx 06-25-2019 (atypia, no cancer), presents with:  UTI: Extensive chart review, see HPI. DX with E. coli UTI, treated with Rocephin and Keflex at the ER, subsequently saw PCP, treated appropriately with Cipro, CBC showed decreased WBCs, urine culture returned negative when checked by PCP. Here because chills and urinary frequency have returned. He has a history of elevated PSA, had a biopsy 06/25/2019. At this point he does not look toxic, he is afebrile, he reports a mild LLQ abdominal pain but physical exam is benign as  well. I suspect he needs more antibiotics, will continue with Cipro for 10 additional days. We will check a UA mostly to check for pyuria or hematuria, no need to repeat the urine culture  b/c I'm basing  RX on the original UCX from the ER. Call if not improving Call if he gets worse, see AVS. Follow-up with PCP scheduled for May, advised to keep it.   This visit occurred during the SARS-CoV-2 public health emergency.  Safety protocols were in place, including screening questions prior to the visit, additional usage of staff PPE, and extensive cleaning of exam room while observing appropriate contact time as indicated for disinfecting solutions.

## 2019-08-18 NOTE — Progress Notes (Signed)
Pre visit review using our clinic review tool, if applicable. No additional management support is needed unless otherwise documented below in the visit note. 

## 2019-08-19 LAB — URINALYSIS, ROUTINE W REFLEX MICROSCOPIC
Bilirubin Urine: NEGATIVE
Hgb urine dipstick: NEGATIVE
Ketones, ur: NEGATIVE
Leukocytes,Ua: NEGATIVE
Nitrite: NEGATIVE
RBC / HPF: NONE SEEN (ref 0–?)
Specific Gravity, Urine: 1.01 (ref 1.000–1.030)
Total Protein, Urine: NEGATIVE
Urine Glucose: NEGATIVE
Urobilinogen, UA: 0.2 (ref 0.0–1.0)
pH: 6.5 (ref 5.0–8.0)

## 2019-09-01 ENCOUNTER — Encounter: Payer: 59 | Admitting: Family Medicine

## 2019-09-04 ENCOUNTER — Other Ambulatory Visit: Payer: Self-pay

## 2019-09-04 ENCOUNTER — Encounter: Payer: Self-pay | Admitting: Family Medicine

## 2019-09-04 ENCOUNTER — Ambulatory Visit (INDEPENDENT_AMBULATORY_CARE_PROVIDER_SITE_OTHER): Payer: 59 | Admitting: Family Medicine

## 2019-09-04 VITALS — BP 114/60 | HR 90 | Temp 97.5°F | Resp 18 | Ht 73.5 in | Wt 304.2 lb

## 2019-09-04 DIAGNOSIS — E1165 Type 2 diabetes mellitus with hyperglycemia: Secondary | ICD-10-CM | POA: Diagnosis not present

## 2019-09-04 DIAGNOSIS — Z0000 Encounter for general adult medical examination without abnormal findings: Secondary | ICD-10-CM

## 2019-09-04 DIAGNOSIS — E1151 Type 2 diabetes mellitus with diabetic peripheral angiopathy without gangrene: Secondary | ICD-10-CM

## 2019-09-04 DIAGNOSIS — IMO0002 Reserved for concepts with insufficient information to code with codable children: Secondary | ICD-10-CM

## 2019-09-04 DIAGNOSIS — E1169 Type 2 diabetes mellitus with other specified complication: Secondary | ICD-10-CM | POA: Diagnosis not present

## 2019-09-04 DIAGNOSIS — E785 Hyperlipidemia, unspecified: Secondary | ICD-10-CM

## 2019-09-04 DIAGNOSIS — E890 Postprocedural hypothyroidism: Secondary | ICD-10-CM

## 2019-09-04 DIAGNOSIS — N183 Chronic kidney disease, stage 3 unspecified: Secondary | ICD-10-CM | POA: Diagnosis not present

## 2019-09-04 DIAGNOSIS — I10 Essential (primary) hypertension: Secondary | ICD-10-CM

## 2019-09-04 NOTE — Progress Notes (Signed)
Patient ID: Alex Rocha, male    DOB: [birth year]  Age: 64 y.o. MRN: [id number]    Subjective:  Subjective  HPI Alex Rocha presents for cpe and f/u    Pt with no complaints   Review of Systems  Constitutional: Negative.   HENT: Negative for congestion, ear pain, hearing loss, nosebleeds, postnasal drip, rhinorrhea, sinus pressure, sneezing and tinnitus.   Eyes: Negative for photophobia, discharge, itching and visual disturbance.  Respiratory: Negative.   Cardiovascular: Negative.   Gastrointestinal: Negative for abdominal distention, abdominal pain, anal bleeding, blood in stool and constipation.  Endocrine: Negative.   Genitourinary: Negative.   Musculoskeletal: Negative.   Skin: Negative.   Allergic/Immunologic: Negative.   Neurological: Negative for dizziness, weakness, light-headedness, numbness and headaches.  Psychiatric/Behavioral: Negative for agitation, confusion, decreased concentration, dysphoric mood, sleep disturbance and suicidal ideas. The patient is not nervous/anxious.     History Past Medical History:  Diagnosis Date  . Colon polyps   . Diabetes mellitus   . Environmental allergies   . Hyperlipidemia   . Hypertension   . Hyperthyroidism   . Kidney stones   . Low testosterone     He has no past surgical history on file.   His family history includes Colon cancer in his brother and mother; Diabetes in his mother and another family member; Heart disease in his mother and sister; Hyperlipidemia in his brother; Hypertension in his mother.He reports that he has never smoked. He has never used smokeless tobacco. He reports current alcohol use. He reports that he does not use drugs.  Current Outpatient Medications on File Prior to Visit  Medication Sig Dispense Refill  . BD INSULIN SYRINGE U/F 31G X 5/16" 1 ML MISC 1 EACH BY OTHER ROUTE 2 (TWO) TIMES DAILY. AS DIRECTED 180 each 3  . diclofenac sodium (VOLTAREN) 1 % GEL APPLY 4 G TOPICALLY 4 (FOUR) TIMES  DAILY. 100 g 1  . fenofibrate 160 MG tablet TAKE 1 TABLET BY MOUTH EVERY DAY 90 tablet 1  . glucose blood (ACCU-CHEK SMARTVIEW) test strip 1 each by Other route 2 (two) times a day. 100 each 12  . insulin NPH Human (HUMULIN N) 100 UNIT/ML injection INJECT 130 UNITS EACH MORNING AND 90 UNITS EACH EVENING 70 mL 13  . levothyroxine (SYNTHROID) 112 MCG tablet Take 1 tablet (112 mcg total) by mouth daily. 30 tablet 2  . loratadine (CLARITIN) 10 MG tablet TAKE 1 TABLET BY MOUTH EVERY DAY 30 tablet 5  . losartan (COZAAR) 100 MG tablet TAKE 1 TABLET BY MOUTH EVERY DAY 30 tablet 5   No current facility-administered medications on file prior to visit.     Objective:  Objective  Physical Exam Vitals and nursing note reviewed.  Constitutional:      General: He is not in acute distress.    Appearance: He is well-developed. He is not diaphoretic.  HENT:     Head: Normocephalic and atraumatic.     Right Ear: External ear normal.     Left Ear: External ear normal.     Nose: Nose normal.     Mouth/Throat:     Pharynx: No oropharyngeal exudate.  Eyes:     General:        Right eye: No discharge.        Left eye: No discharge.     Conjunctiva/sclera: Conjunctivae normal.     Pupils: Pupils are equal, round, and reactive to light.  Neck:     Thyroid: No  thyromegaly.     Vascular: No JVD.  Cardiovascular:     Rate and Rhythm: Normal rate and regular rhythm.     Heart sounds: No murmur. No friction rub. No gallop.   Pulmonary:     Effort: Pulmonary effort is normal. No respiratory distress.     Breath sounds: Normal breath sounds. No wheezing or rales.  Chest:     Chest wall: No tenderness.  Abdominal:     General: Bowel sounds are normal. There is no distension.     Palpations: Abdomen is soft. There is no mass.     Tenderness: There is no abdominal tenderness. There is no guarding or rebound.  Musculoskeletal:        General: No tenderness. Normal range of motion.     Cervical back:  Normal range of motion and neck supple.  Lymphadenopathy:     Cervical: No cervical adenopathy.  Skin:    General: Skin is warm and dry.     Coloration: Skin is not pale.     Findings: No erythema or rash.  Neurological:     Mental Status: He is alert and oriented to person, place, and time.     Motor: No abnormal muscle tone.     Deep Tendon Reflexes: Reflexes are normal and symmetric. Reflexes normal.  Psychiatric:        Behavior: Behavior normal.        Thought Content: Thought content normal.        Judgment: Judgment normal.    BP 114/60 (BP Location: Right Arm, Patient Position: Sitting, Cuff Size: Large)   Pulse 90   Temp (!) 97.5 F (36.4 C) (Temporal)   Resp 18   Ht 6' 1.5" (1.867 m)   Wt (!) 304 lb 3.2 oz (138 kg)   SpO2 94%   BMI 39.59 kg/m  Wt Readings from Last 3 Encounters:  09/04/19 (!) 304 lb 3.2 oz (138 kg)  08/18/19 (!) 314 lb 8 oz (142.7 kg)  08/08/19 (!) 309 lb 6.4 oz (140.3 kg)     Lab Results  Component Value Date   WBC 6.7 08/08/2019   HGB 11.5 (L) 08/08/2019   HCT 34.7 (L) 08/08/2019   PLT 325.0 08/08/2019   GLUCOSE 179 (H) 08/08/2019   CHOL 154 08/08/2019   TRIG 179.0 (H) 08/08/2019   HDL 24.10 (L) 08/08/2019   LDLDIRECT 73.0 12/04/2017   LDLCALC 94 08/08/2019   ALT 43 08/08/2019   AST 35 08/08/2019   NA 139 08/08/2019   K 4.8 08/08/2019   CL 106 08/08/2019   CREATININE 1.52 (H) 08/08/2019   BUN 35 (H) 08/08/2019   CO2 25 08/08/2019   TSH 5.02 (H) 08/08/2019   PSA 5.25 (H) 08/08/2019   HGBA1C 6.6 (H) 05/29/2019   MICROALBUR 0.2 09/05/2019    No results found.   Assessment & Plan:  Plan  I have discontinued Alex Rocha ciprofloxacin. I am also having him maintain his diclofenac sodium, Accu-Chek SmartView, insulin NPH Human, losartan, BD Insulin Syringe U/F, fenofibrate, loratadine, and levothyroxine.  No orders of the defined types were placed in this encounter.   Problem List Items Addressed This Visit       Unprioritized   DM (diabetes mellitus) type II uncontrolled, periph vascular disorder ([place])    hgba1c to be checked, minimize simple carbs. Increase exercise as tolerated. Continue current meds      HTN (hypertension)    Well controlled, no changes to meds. Encouraged heart  healthy diet such as the DASH diet and exercise as tolerated.       Relevant Orders   POCT Urinalysis Dipstick (Automated)   TSH   Microalbumin / creatinine urine ratio (Completed)   Lipid panel   Comprehensive metabolic panel   CBC with Differential/Platelet   Hyperlipidemia LDL goal <70    Encouraged heart healthy diet, increase exercise, avoid trans fats, consider a krill oil cap daily      Hypothyroidism following radioiodine therapy    Check labs        Other Visit Diagnoses    Preventative health care    -  Primary   Relevant Orders   POCT Urinalysis Dipstick (Automated)   TSH   Microalbumin / creatinine urine ratio (Completed)   Lipid panel   Comprehensive metabolic panel   CBC with Differential/Platelet   Uncontrolled type 2 diabetes mellitus with hyperglycemia (HCC)       Relevant Orders   Microalbumin / creatinine urine ratio (Completed)   Comprehensive metabolic panel   Stage 3 chronic kidney disease, unspecified whether stage 3a or 3b CKD       Relevant Orders   Microalbumin / creatinine urine ratio (Completed)   Hyperlipidemia associated with type 2 diabetes mellitus (HCC)       Relevant Orders   Microalbumin / creatinine urine ratio (Completed)   Lipid panel   Comprehensive metabolic panel      Follow-up: Return in about 6 months (around 03/06/2020), or if symptoms worsen or fail to improve, for hypertension, hyperlipidemia, diabetes II.  Ann Held, DO

## 2019-09-04 NOTE — Patient Instructions (Signed)
Preventive Care 41-62 Years Old, Male Preventive care refers to lifestyle choices and visits with your health care provider that can promote health and wellness. This includes:  A yearly physical exam. This is also called an annual well check.  Regular dental and eye exams.  Immunizations.  Screening for certain conditions.  Healthy lifestyle choices, such as eating a healthy diet, getting regular exercise, not using drugs or products that contain nicotine and tobacco, and limiting alcohol use. What can I expect for my preventive care visit? Physical exam Your health care provider will check:  Height and weight. These may be used to calculate body mass index (BMI), which is a measurement that tells if you are at a healthy weight.  Heart rate and blood pressure.  Your skin for abnormal spots. Counseling Your health care provider may ask you questions about:  Alcohol, tobacco, and drug use.  Emotional well-being.  Home and relationship well-being.  Sexual activity.  Eating habits.  Work and work Statistician. What immunizations do I need?  Influenza (flu) vaccine  This is recommended every year. Tetanus, diphtheria, and pertussis (Tdap) vaccine  You may need a Td booster every 10 years. Varicella (chickenpox) vaccine  You may need this vaccine if you have not already been vaccinated. Zoster (shingles) vaccine  You may need this after age 64. Measles, mumps, and rubella (MMR) vaccine  You may need at least one dose of MMR if you were born in 1957 or later. You may also need a second dose. Pneumococcal conjugate (PCV13) vaccine  You may need this if you have certain conditions and were not previously vaccinated. Pneumococcal polysaccharide (PPSV23) vaccine  You may need one or two doses if you smoke cigarettes or if you have certain conditions. Meningococcal conjugate (MenACWY) vaccine  You may need this if you have certain conditions. Hepatitis A  vaccine  You may need this if you have certain conditions or if you travel or work in places where you may be exposed to hepatitis A. Hepatitis B vaccine  You may need this if you have certain conditions or if you travel or work in places where you may be exposed to hepatitis B. Haemophilus influenzae type b (Hib) vaccine  You may need this if you have certain risk factors. Human papillomavirus (HPV) vaccine  If recommended by your health care provider, you may need three doses over 6 months. You may receive vaccines as individual doses or as more than one vaccine together in one shot (combination vaccines). Talk with your health care provider about the risks and benefits of combination vaccines. What tests do I need? Blood tests  Lipid and cholesterol levels. These may be checked every 5 years, or more frequently if you are over 60 years old.  Hepatitis C test.  Hepatitis B test. Screening  Lung cancer screening. You may have this screening every year starting at age 43 if you have a 30-pack-year history of smoking and currently smoke or have quit within the past 15 years.  Prostate cancer screening. Recommendations will vary depending on your family history and other risks.  Colorectal cancer screening. All adults should have this screening starting at age 72 and continuing until age 2. Your health care provider may recommend screening at age 14 if you are at increased risk. You will have tests every 1-10 years, depending on your results and the type of screening test.  Diabetes screening. This is done by checking your blood sugar (glucose) after you have not eaten  for a while (fasting). You may have this done every 1-3 years.  Sexually transmitted disease (STD) testing. Follow these instructions at home: Eating and drinking  Eat a diet that includes fresh fruits and vegetables, whole grains, lean protein, and low-fat dairy products.  Take vitamin and mineral supplements as  recommended by your health care provider.  Do not drink alcohol if your health care provider tells you not to drink.  If you drink alcohol: ? Limit how much you have to 0-2 drinks a day. ? Be aware of how much alcohol is in your drink. In the [place] one drink equals one 12 oz bottle of beer (355 mL), one 5 oz glass of wine (148 mL), or one 1 oz glass of hard liquor (44 mL). Lifestyle  Take daily care of your teeth and gums.  Stay active. Exercise for at least 30 minutes on 5 or more days each week.  Do not use any products that contain nicotine or tobacco, such as cigarettes, e-cigarettes, and chewing tobacco. If you need help quitting, ask your health care provider.  If you are sexually active, practice safe sex. Use a condom or other form of protection to prevent STIs (sexually transmitted infections).  Talk with your health care provider about taking a low-dose aspirin every day starting at age 53. What's next?  Go to your health care provider once a year for a well check visit.  Ask your health care provider how often you should have your eyes and teeth checked.  Stay up to date on all vaccines. This information is not intended to replace advice given to you by your health care provider. Make sure you discuss any questions you have with your health care provider. Document Revised: 03/28/2018 Document Reviewed: 03/28/2018 Elsevier Patient Education  2020 [employer].

## 2019-09-05 ENCOUNTER — Other Ambulatory Visit: Payer: 59

## 2019-09-05 ENCOUNTER — Telehealth: Payer: Self-pay | Admitting: *Deleted

## 2019-09-05 DIAGNOSIS — Z0000 Encounter for general adult medical examination without abnormal findings: Secondary | ICD-10-CM

## 2019-09-05 DIAGNOSIS — N183 Chronic kidney disease, stage 3 unspecified: Secondary | ICD-10-CM

## 2019-09-05 DIAGNOSIS — I10 Essential (primary) hypertension: Secondary | ICD-10-CM

## 2019-09-05 DIAGNOSIS — E1169 Type 2 diabetes mellitus with other specified complication: Secondary | ICD-10-CM

## 2019-09-05 DIAGNOSIS — E1165 Type 2 diabetes mellitus with hyperglycemia: Secondary | ICD-10-CM

## 2019-09-05 NOTE — Telephone Encounter (Signed)
Form signed by Etter Sjogren.  Form faxed.

## 2019-09-05 NOTE — Progress Notes (Signed)
Pt had labs drawn by urologist, Dr Justin Mend this morning and were sent to [place] and spoke with Iona Beard to add CMP, lipids and tsh. Dr Jason Nest office will fax CBC result to [place]. Authorization request will be faxed to the nurses station to add the tests requested.

## 2019-09-05 NOTE — Telephone Encounter (Signed)
Pt came to lab stating he had labs taken this morning through his urologist, Dr Justin Mend and could tests be run from his specimen. Spoke with nurse at Dr Jason Nest office, states they did CBC, urine culture, creatinine, renal panel, iron studies. They will fax CBC to [place]. [facility] [employer] and spoke with [place]. Requested add on for CMP, lipid panel and tsh. They will fax add on request to Heather's attn on nurses station fax.

## 2019-09-06 LAB — MICROALBUMIN / CREATININE URINE RATIO
Creatinine, Urine: 46 mg/dL (ref 20–320)
Microalb Creat Ratio: 4 mcg/mg creat (ref <30)
Microalb, Ur: 0.2 mg/dL

## 2019-09-08 NOTE — Telephone Encounter (Signed)
Received call back from [facility] late Friday afternoon per CMA, Blevins, stating they are not able to add testing requested by PCP. Left detailed message on pt's voicemail to call and schedule lab appt. Please let Lab know if scheduler is having difficulty getting pt in on the lab schedule this week.

## 2019-09-08 NOTE — Assessment & Plan Note (Signed)
Encouraged heart healthy diet, increase exercise, avoid trans fats, consider a krill oil cap daily 

## 2019-09-08 NOTE — Assessment & Plan Note (Addendum)
hgba1c to be checked, minimize simple carbs. Increase exercise as tolerated. Continue current meds  

## 2019-09-08 NOTE — Assessment & Plan Note (Signed)
Check labs 

## 2019-09-08 NOTE — Assessment & Plan Note (Signed)
Well controlled, no changes to meds. Encouraged heart healthy diet such as the DASH diet and exercise as tolerated.  °

## 2019-09-10 ENCOUNTER — Telehealth (INDEPENDENT_AMBULATORY_CARE_PROVIDER_SITE_OTHER): Payer: 59 | Admitting: Medical

## 2019-09-10 ENCOUNTER — Other Ambulatory Visit: Payer: Self-pay

## 2019-09-10 ENCOUNTER — Other Ambulatory Visit: Payer: 59

## 2019-09-10 ENCOUNTER — Encounter: Payer: Self-pay | Admitting: Medical

## 2019-09-10 VITALS — BP 120/60 | HR 83 | Temp 97.6°F | Resp 16

## 2019-09-10 DIAGNOSIS — R509 Fever, unspecified: Secondary | ICD-10-CM | POA: Diagnosis not present

## 2019-09-10 DIAGNOSIS — N419 Inflammatory disease of prostate, unspecified: Secondary | ICD-10-CM | POA: Diagnosis not present

## 2019-09-10 DIAGNOSIS — E785 Hyperlipidemia, unspecified: Secondary | ICD-10-CM

## 2019-09-10 DIAGNOSIS — R300 Dysuria: Secondary | ICD-10-CM | POA: Diagnosis not present

## 2019-09-10 DIAGNOSIS — E890 Postprocedural hypothyroidism: Secondary | ICD-10-CM

## 2019-09-10 DIAGNOSIS — E1165 Type 2 diabetes mellitus with hyperglycemia: Secondary | ICD-10-CM

## 2019-09-10 DIAGNOSIS — E039 Hypothyroidism, unspecified: Secondary | ICD-10-CM

## 2019-09-10 MED ORDER — CIPROFLOXACIN HCL 500 MG PO TABS: 500.0000 mg | ORAL_TABLET | Freq: Two times a day (BID) | ORAL | 0 refills | Status: DC

## 2019-09-10 NOTE — Progress Notes (Addendum)
Subjective:    Patient ID: Alex Rocha, male    DOB: Jun 16, 1955, 64 y.o.   MRN: [id number]  HPI  Virtual Visit via Video Note  I connected with Britt Boozer on 09/10/19 at  4:20 PM EDT by a video enabled telemedicine application and verified that I am speaking with the correct person using two identifiers.  Location: Patient: home Provider: office   I discussed the limitations of evaluation and management by telemedicine and the availability of in person appointments. The patient expressed understanding and agreed to proceed.    History of Present Illness  Virtual Visit via Video Note  I connected with Britt Boozer on 09/10/19 at  4:20 PM EDT by a video enabled telemedicine application and verified that I am speaking with the correct person using two identifiers.  Location: Patient: Home Provider: Office   I discussed the limitations of evaluation and management by telemedicine and the availability of in person appointments. The patient expressed understanding and agreed to proceed.  History of Present Illness: Patient reports onset of chills, weakness, fever and intermittent pain on urination since last night.  States when he urinates he has pain at the tip of his penis.  Patient does have a history of elevated PSA and is followed by urologist.  He had a area on biopsy was inconclusive and scheduled to have MRI in September.  Patient gives history of having intermittent urinary symptoms since middle April.  In April he was evaluated by the emergency department.  He was treated for acute cystitis and prescribed Keflex.  Patient then followed up with PCP Dr. Etter Sjogren who discontinued Keflex and prescribe Cipro.  Then about 2 weeks later patient follow-up and saw Dr. Larose Kells.  Date of service Aug 18, 2019 HPI reads  Martin Majestic to the ER 08/01/2019, had some chills, had frequent urination, no dysuria. UA + for white cells, urine culture showed E. coli.  Kidney function was a  slightly decreased with a creatinine of 1.91. At the ER he got Rocephin and was d/c home on keflex  Was seen by PCP 08/08/2019, labs were repeated, WBCs were improving, kidney function improving, abx switched toCipro. PSA was a slightly elevated at 5.2 UCX returned (-)  He is here today because for the last couple of days chills have returned and he has noted more urinary frequency. Denies fevers. No flank pain Mild left lower quadrant discomfort. No gross hematuria or dysuria No nausea, vomiting, diarrhea.   Patient was given additional Cipro prescription for 10 days and eventually he states he did get better.  He did have asymptomatic number of days up until last night.  He cannot specify exactly how he days he felt well.  Patient was scheduled as my last patient of the day today.  Lab was already closed.  I would have preferred patient be seen in the office earlier in the day.  Addendum today 5-27. In office. He feels better. No fever, not weak and not dizzy. Overall feels better took one cipro last night.    Observations/Objective: General-no acute distress, pleasant, oriented.  No acute distress. Lungs- on inspection lungs appear unlabored. Neck- no tracheal deviation or jvd on inspection. Neuro- gross motor function appears intact.   Assessment and Plan: Patient has long history of intermittent urinary tract infection/possible prostatitis type symptoms over the last 5 weeks.  Recent period of no symptoms up until last night when had recurrence of symptoms.  Went ahead and prescribe Cipro 500  mg tablets that he can start tonight.  Tylenol for fever.  Stay well-hydrated.  Unfortunately patient was scheduled as a virtual visit but nature of complaints as well as signs/symptoms require patient to be seen in the office.  He will need to give urine sample and blood work.  I got him scheduled for a 20 tomorrow morning.  Will take her blood pressure and pulse which he did not have  today on virtual visit.  Will likely give Rocephin 1 g IM.  We will place labs to get CMP, CBC, PSA, urine POCT and a culture.  If signs and symptoms worsen dramatically then recommend ED evaluation.  We will send results of PSA and urine studies to patient's urologist.  We will have patient to follow-up next in 7 days or as needed.  Mackie Pai, PA-C   Time spent on pt care was  30  minutes which consisted of extensive chart review due to 3 visit for same complaints, discussing diagnosis, work up, treatment, counseling on dx/importance of follow up and documentation.  Note charging pt today for date of service. But will need to evaluate pt tomorrow morning. So will amend physical. Needed to assess today and follow up tomorrow.  Follow Up Instructions:    I discussed the assessment and treatment plan with the patient. The patient was provided an opportunity to ask questions and all were answered. The patient agreed with the plan and demonstrated an understanding of the instructions.   The patient was advised to call back or seek an in-person evaluation if the symptoms worsen or if the condition fails to improve as anticipated.     Mackie Pai, PA-C       Observations/Objective:   Assessment and Plan:   Follow Up Instructions:    I discussed the assessment and treatment plan with the patient. The patient was provided an opportunity to ask questions and all were answered. The patient agreed with the plan and demonstrated an understanding of the instructions.   The patient was advised to call back or seek an in-person evaluation if the symptoms worsen or if the condition fails to improve as anticipated.     Mackie Pai, PA-C   Review of Systems     Objective:   Physical Exam        Assessment & Plan:

## 2019-09-10 NOTE — Patient Instructions (Signed)
Patient has long history of intermittent urinary tract infection/possible prostatitis type symptoms over the last 5 weeks.  Recent period of no symptoms up until last night when had recurrence of symptoms.  Went ahead and prescribe Cipro 500 mg tablets that he can start tonight.  Tylenol for fever.  Stay well-hydrated.  Unfortunately patient was scheduled as a virtual visit but nature of complaints as well as signs/symptoms require patient to be seen in the office.  He will need to give urine sample and blood work.  I got him scheduled for a 20 tomorrow morning.  Will take her blood pressure and pulse which he did not have today on virtual visit.  Will likely give Rocephin 1 g IM.  We will place labs to get CMP, CBC, PSA, urine POCT and a culture.  If signs and symptoms worsen dramatically then recommend ED evaluation.  We will send results of PSA and urine studies to patient's urologist.  We will have patient to follow-up next in 7 days or as needed.

## 2019-09-11 ENCOUNTER — Ambulatory Visit: Payer: 59 | Admitting: Medical

## 2019-09-11 ENCOUNTER — Encounter: Payer: Self-pay | Admitting: Medical

## 2019-09-11 ENCOUNTER — Other Ambulatory Visit: Payer: Self-pay

## 2019-09-11 ENCOUNTER — Other Ambulatory Visit (INDEPENDENT_AMBULATORY_CARE_PROVIDER_SITE_OTHER): Payer: 59

## 2019-09-11 DIAGNOSIS — N419 Inflammatory disease of prostate, unspecified: Secondary | ICD-10-CM

## 2019-09-11 DIAGNOSIS — R300 Dysuria: Secondary | ICD-10-CM

## 2019-09-11 DIAGNOSIS — R509 Fever, unspecified: Secondary | ICD-10-CM | POA: Diagnosis not present

## 2019-09-11 DIAGNOSIS — E785 Hyperlipidemia, unspecified: Secondary | ICD-10-CM | POA: Diagnosis not present

## 2019-09-11 DIAGNOSIS — E039 Hypothyroidism, unspecified: Secondary | ICD-10-CM

## 2019-09-11 LAB — LIPID PANEL
Cholesterol: 106 mg/dL (ref 0–200)
HDL: 34.5 mg/dL — ABNORMAL LOW (ref >39.00)
LDL Cholesterol: 54 mg/dL (ref 0–99)
NonHDL: 71.78
Total CHOL/HDL Ratio: 3
Triglycerides: 88 mg/dL (ref 0.0–149.0)
VLDL: 17.6 mg/dL (ref 0.0–40.0)

## 2019-09-11 LAB — POC URINALSYSI DIPSTICK (AUTOMATED)
Bilirubin, UA: NEGATIVE
Blood, UA: NEGATIVE
Glucose, UA: NEGATIVE
Ketones, UA: NEGATIVE
Nitrite, UA: NEGATIVE
Protein, UA: NEGATIVE
Spec Grav, UA: 1.02 (ref 1.010–1.025)
Urobilinogen, UA: 0.2 E.U./dL
pH, UA: 5 (ref 5.0–8.0)

## 2019-09-11 LAB — COMPREHENSIVE METABOLIC PANEL
ALT: 38 U/L (ref 0–53)
AST: 38 U/L — ABNORMAL HIGH (ref 0–37)
Albumin: 4.5 g/dL (ref 3.5–5.2)
Alkaline Phosphatase: 36 U/L — ABNORMAL LOW (ref 39–117)
BUN: 56 mg/dL — ABNORMAL HIGH (ref 6–23)
CO2: 23 mEq/L (ref 19–32)
Calcium: 9.4 mg/dL (ref 8.4–10.5)
Chloride: 104 mEq/L (ref 96–112)
Creatinine, Ser: 2.22 mg/dL — ABNORMAL HIGH (ref 0.40–1.50)
GFR: 36.54 mL/min — ABNORMAL LOW (ref >60.00)
Glucose, Bld: 168 mg/dL — ABNORMAL HIGH (ref 70–99)
Potassium: 4.6 mEq/L (ref 3.5–5.1)
Sodium: 133 mEq/L — ABNORMAL LOW (ref 135–145)
Total Bilirubin: 0.5 mg/dL (ref 0.2–1.2)
Total Protein: 7.3 g/dL (ref 6.0–8.3)

## 2019-09-11 LAB — CBC WITH DIFFERENTIAL/PLATELET
Basophils Absolute: 0.1 10*3/uL (ref 0.0–0.1)
Basophils Relative: 0.5 % (ref 0.0–3.0)
Eosinophils Absolute: 0.1 10*3/uL (ref 0.0–0.7)
Eosinophils Relative: 0.8 % (ref 0.0–5.0)
HCT: 34.7 % — ABNORMAL LOW (ref 39.0–52.0)
Hemoglobin: 11.5 g/dL — ABNORMAL LOW (ref 13.0–17.0)
Lymphocytes Relative: 13.1 % (ref 12.0–46.0)
Lymphs Abs: 1.8 10*3/uL (ref 0.7–4.0)
MCHC: 33 g/dL (ref 30.0–36.0)
MCV: 95.3 fl (ref 78.0–100.0)
Monocytes Absolute: 1.2 10*3/uL — ABNORMAL HIGH (ref 0.1–1.0)
Monocytes Relative: 8.6 % (ref 3.0–12.0)
Neutro Abs: 10.5 10*3/uL — ABNORMAL HIGH (ref 1.4–7.7)
Neutrophils Relative %: 77 % (ref 43.0–77.0)
Platelets: 254 10*3/uL (ref 150.0–400.0)
RBC: 3.64 Mil/uL — ABNORMAL LOW (ref 4.22–5.81)
RDW: 14.9 % (ref 11.5–15.5)
WBC: 13.6 10*3/uL — ABNORMAL HIGH (ref 4.0–10.5)

## 2019-09-11 LAB — TSH: TSH: 0.76 u[IU]/mL (ref 0.35–4.50)

## 2019-09-11 LAB — PSA: PSA: 17.51 ng/mL — ABNORMAL HIGH (ref 0.10–4.00)

## 2019-09-11 MED ORDER — CEFTRIAXONE SODIUM 500 MG IJ SOLR
500.0000 mg | Freq: Once | INTRAMUSCULAR | Status: AC
  Administered 2019-09-11: 500 mg via INTRAMUSCULAR

## 2019-09-11 NOTE — Progress Notes (Addendum)
   Subjective:    Patient ID: Alex Rocha, male    DOB: 1955-04-18, 64 y.o.   MRN: [id number]  HPI No charge. Saw yesterday. Needed to see in office today for lab and physical exam. He had virt visit see yesteday not.   Review of Systems     Objective:   Physical Exam        Assessment & Plan:  Pt was given rocephin 500 mg im prostatitis(we did not have 1 gram in office day of service). Given as follow up for virtual visit day prior.

## 2019-09-11 NOTE — Addendum Note (Signed)
Addended by: Jeronimo Greaves on: 09/11/2019 09:54 AM   Modules accepted: Orders

## 2019-09-11 NOTE — Addendum Note (Signed)
Addended by: Anabel Halon on: 09/11/2019 09:04 AM   Modules accepted: Orders

## 2019-09-12 LAB — URINE CULTURE
MICRO NUMBER:: 10527173
Result:: NO GROWTH
SPECIMEN QUALITY:: ADEQUATE

## 2019-09-18 ENCOUNTER — Encounter: Payer: Self-pay | Admitting: Family Medicine

## 2019-09-18 ENCOUNTER — Ambulatory Visit: Payer: 59 | Admitting: Family Medicine

## 2019-09-18 ENCOUNTER — Other Ambulatory Visit: Payer: Self-pay

## 2019-09-18 VITALS — BP 120/70 | HR 78 | Temp 97.3°F | Resp 18 | Ht 73.0 in | Wt 305.0 lb

## 2019-09-18 DIAGNOSIS — N1832 Chronic kidney disease, stage 3b: Secondary | ICD-10-CM | POA: Diagnosis not present

## 2019-09-18 DIAGNOSIS — R300 Dysuria: Secondary | ICD-10-CM

## 2019-09-18 LAB — POC URINALSYSI DIPSTICK (AUTOMATED)
Bilirubin, UA: NEGATIVE
Blood, UA: NEGATIVE
Glucose, UA: NEGATIVE
Ketones, UA: NEGATIVE
Leukocytes, UA: NEGATIVE
Nitrite, UA: NEGATIVE
Protein, UA: NEGATIVE
Spec Grav, UA: 1.025 (ref 1.010–1.025)
Urobilinogen, UA: 0.2 E.U./dL
pH, UA: 5 (ref 5.0–8.0)

## 2019-09-18 NOTE — Patient Instructions (Signed)
Food Basics for Chronic Kidney Disease When your kidneys are not working well, they cannot remove waste and excess substances from your blood as effectively as they did before. This can lead to a buildup and imbalance of these substances, which can worsen kidney damage and affect how your body functions. Certain foods lead to a buildup of these substances in the body. By changing your diet as recommended by your diet and nutrition specialist (dietitian) or health care provider, you could help prevent further kidney damage and delay or prevent the need for dialysis. What are tips for following this plan? General instructions   Work with your health care provider and dietitian to develop a meal plan that is right for you. Foods you can eat, limit, or avoid will be different for each person depending on the stage of kidney disease and any other existing health conditions.  Talk with your health care provider about whether you should take a vitamin and mineral supplement.  Use standard measuring cups and spoons to measure servings of foods. Use a kitchen scale to measure portions of protein foods.  If directed by your health care provider, avoid drinking too much fluid. Measure and count all liquids, including water, ice, soups, flavored gelatin, and frozen desserts such as popsicles or ice cream. Reading food labels  Check the amount of sodium in foods. Choose foods that have less than 300 milligrams (mg) per serving.  Check the ingredient list for phosphorus or potassium-based additives or preservatives.  Check the amount of saturated and trans fat. Limit or avoid these fats as told by your dietitian. Shopping  Avoid buying foods that are: ? Processed, frozen, or prepackaged. ? Calcium-enriched or fortified.  Do not buy foods that have salt or sodium listed among the first five ingredients.  Do not buy canned vegetables. Cooking  Replace animal proteins, such as meat, fish, eggs, or  dairy, with plant proteins from beans, nuts, and soy. ? Use soy milk instead of cow's milk. ? Add beans or tofu to soups, casseroles, or pasta dishes instead of meat.  Soak vegetables, such as potatoes, before cooking to reduce potassium. To do this: ? Peel and cut into small pieces. ? Soak in warm water for at least 2 hours. For every 1 cup of vegetables, use 10 cups of water. ? Drain and rinse with warm water. ? Boil for at least 5 minutes. Meal planning  Limit the amount of protein from plant and animal sources you eat each day.  Do not add salt to food when cooking or before eating.  Eat meals and snacks at around the same time each day. If you have diabetes:  If you have diabetes (diabetes mellitus) and chronic kidney disease, it is important to keep your blood glucose in the target range recommended by your health care provider. Follow your diabetes management plan. This may include: ? Checking your blood glucose regularly. ? Taking oral medicines, insulin, or both. ? Exercising for at least 30 minutes on 5 or more days each week, or as told by your health care provider. ? Tracking how many servings of carbohydrates you eat at each meal.  You may be given specific guidelines on how much of certain foods and nutrients you may eat, depending on your stage of kidney disease and whether you have high blood pressure (hypertension). Follow your meal plan as told by your dietitian. What nutrients should be limited? The items listed are not a complete list. Talk with your dietitian   about what dietary choices are best for you. Potassium Potassium affects how steadily your heart beats. If too much potassium builds up in your blood, it can cause an irregular heartbeat or even a heart attack. You may need to eat less potassium, depending on your blood potassium levels and the stage of kidney disease. Talk to your dietitian about how much potassium you may have each day. You may need to limit  or avoid foods that are high in potassium, such as:  Milk and soy milk.  Fruits, such as bananas, papaya, apricots, nectarines, melon, prunes, raisins, kiwi, and oranges.  Vegetables, such as potatoes, sweet potatoes, yams, tomatoes, leafy greens, beets, okra, avocado, pumpkin, and winter squash.  White and lima beans. Phosphorus Phosphorus is a mineral found in your bones. A balance between calcium and phosphorous is needed to build and maintain healthy bones. Too much phosphorus pulls calcium from your bones. This can make your bones weak and more likely to break. Too much phosphorus can also make your skin itch. You may need to eat less phosphorus depending on your blood phosphorus levels and the stage of kidney disease. Talk to your dietitian about how much potassium you may have each day. You may need to take medicine to lower your blood phosphorus levels if diet changes do not help. You may need to limit or avoid foods that are high in phosphorus, such as:  Milk and dairy products.  Dried beans and peas.  Tofu, soy milk, and other soy-based meat replacements.  Colas.  Nuts and peanut butter.  Meat, poultry, and fish.  Bran cereals and oatmeals. Protein Protein helps you to make and keep muscle. It also helps in the repair of your body's cells and tissues. One of the natural breakdown products of protein is a waste product called urea. When your kidneys are not working properly, they cannot remove wastes, such as urea, like they did before you developed chronic kidney disease. Reducing how much protein you eat can help prevent a buildup of urea in your blood. Depending on your stage of kidney disease, you may need to limit foods that are high in protein. Sources of animal protein include:  Meat (all types).  Fish and seafood.  Poultry.  Eggs.  Dairy. Other protein foods include:  Beans and legumes.  Nuts and nut butter.  Soy and tofu. Sodium Sodium, which is found  in salt, helps maintain a healthy balance of fluids in your body. Too much sodium can increase your blood pressure and have a negative effect on the function of your heart and lungs. Too much sodium can also cause your body to retain too much fluid, making your kidneys work harder. Most people should have less than 2,300 milligrams (mg) of sodium each day. If you have hypertension, you may need to limit your sodium to 1,500 mg each day. Talk to your dietitian about how much sodium you may have each day. You may need to limit or avoid foods that are high in sodium, such as:  Salt seasonings.  Soy sauce.  Cured and processed meats.  Salted crackers and snack foods.  Fast food.  Canned soups and most canned foods.  Pickled foods.  Vegetable juice.  Boxed mixes or ready-to-eat boxed meals and side dishes.  Bottled dressings, sauces, and marinades. Summary  Chronic kidney disease can lead to a buildup and imbalance of waste and excess substances in the body. Certain foods lead to a buildup of these substances. By adjusting   your intake of these foods, you could help prevent more kidney damage and delay or prevent the need for dialysis.  Food adjustments are different for each person with chronic kidney disease. Work with a dietitian to set up nutrient goals and a meal plan that is right for you.  If you have diabetes and chronic kidney disease, it is important to keep your blood glucose in the target range recommended by your health care provider. This information is not intended to replace advice given to you by your health care provider. Make sure you discuss any questions you have with your health care provider. Document Revised: 07/25/2018 Document Reviewed: 03/29/2016 Elsevier Patient Education  2020 [employer] Inc[employer]  

## 2019-09-19 DIAGNOSIS — N1832 Chronic kidney disease, stage 3b: Secondary | ICD-10-CM | POA: Insufficient documentation

## 2019-09-19 DIAGNOSIS — R300 Dysuria: Secondary | ICD-10-CM | POA: Insufficient documentation

## 2019-09-19 LAB — URINE CULTURE
MICRO NUMBER:: 10549164
Result:: NO GROWTH
SPECIMEN QUALITY:: ADEQUATE

## 2019-09-19 NOTE — Assessment & Plan Note (Signed)
F/u nephrology 

## 2019-09-19 NOTE — Assessment & Plan Note (Signed)
Resolved with abx Culture pending

## 2019-09-19 NOTE — Progress Notes (Signed)
Patient ID: Alex Rocha, male    DOB: 1955/05/20  Age: 64 y.o. MRN: [id number]    Subjective:  Subjective  HPI Alex Rocha presents for f/u dysuria----- it resolved with antibiotic No other complaints   Review of Systems  Constitutional: Negative for appetite change, diaphoresis, fatigue and unexpected weight change.  Eyes: Negative for pain, redness and visual disturbance.  Respiratory: Negative for cough, chest tightness, shortness of breath and wheezing.   Cardiovascular: Negative for chest pain, palpitations and leg swelling.  Endocrine: Negative for cold intolerance, heat intolerance, polydipsia, polyphagia and polyuria.  Genitourinary: Negative for difficulty urinating, dysuria and frequency.  Neurological: Negative for dizziness, light-headedness, numbness and headaches.    History Past Medical History:  Diagnosis Date  . Colon polyps   . Diabetes mellitus   . Environmental allergies   . Hyperlipidemia   . Hypertension   . Hyperthyroidism   . Kidney stones   . Low testosterone     He has no past surgical history on file.   His family history includes Colon cancer in his brother and mother; Diabetes in his mother and another family member; Heart disease in his mother and sister; Hyperlipidemia in his brother; Hypertension in his mother.He reports that he has never smoked. He has never used smokeless tobacco. He reports current alcohol use. He reports that he does not use drugs.  Current Outpatient Medications on File Prior to Visit  Medication Sig Dispense Refill  . BD INSULIN SYRINGE U/F 31G X 5/16" 1 ML MISC 1 EACH BY OTHER ROUTE 2 (TWO) TIMES DAILY. AS DIRECTED 180 each 3  . ciprofloxacin (CIPRO) 500 MG tablet Take 1 tablet (500 mg total) by mouth 2 (two) times daily. 28 tablet 0  . diclofenac sodium (VOLTAREN) 1 % GEL APPLY 4 G TOPICALLY 4 (FOUR) TIMES DAILY. 100 g 1  . fenofibrate 160 MG tablet TAKE 1 TABLET BY MOUTH EVERY DAY 90 tablet 1  . glucose blood  (ACCU-CHEK SMARTVIEW) test strip 1 each by Other route 2 (two) times a day. 100 each 12  . insulin NPH Human (HUMULIN N) 100 UNIT/ML injection INJECT 130 UNITS EACH MORNING AND 90 UNITS EACH EVENING 70 mL 13  . levothyroxine (SYNTHROID) 112 MCG tablet Take 1 tablet (112 mcg total) by mouth daily. 30 tablet 2  . loratadine (CLARITIN) 10 MG tablet TAKE 1 TABLET BY MOUTH EVERY DAY 30 tablet 5  . losartan (COZAAR) 100 MG tablet TAKE 1 TABLET BY MOUTH EVERY DAY 30 tablet 5   No current facility-administered medications on file prior to visit.     Objective:  Objective  Physical Exam Vitals and nursing note reviewed.  Constitutional:      General: He is sleeping.     Appearance: He is well-developed.  HENT:     Head: Normocephalic and atraumatic.  Eyes:     Pupils: Pupils are equal, round, and reactive to light.  Neck:     Thyroid: No thyromegaly.  Cardiovascular:     Rate and Rhythm: Normal rate and regular rhythm.     Heart sounds: No murmur.  Pulmonary:     Effort: Pulmonary effort is normal. No respiratory distress.     Breath sounds: Normal breath sounds. No wheezing or rales.  Chest:     Chest wall: No tenderness.  Musculoskeletal:        General: No tenderness.     Cervical back: Normal range of motion and neck supple.  Skin:  General: Skin is warm and dry.  Neurological:     Mental Status: He is oriented to person, place, and time.  Psychiatric:        Behavior: Behavior normal.        Thought Content: Thought content normal.        Judgment: Judgment normal.    BP 120/70 (BP Location: Right Arm, Patient Position: Sitting, Cuff Size: Large)   Pulse 78   Temp (!) 97.3 F (36.3 C) (Temporal)   Resp 18   Ht 6\' 1"  (1.854 m)   Wt (!) 305 lb (138.3 kg)   SpO2 95%   BMI 40.24 kg/m  Wt Readings from Last 3 Encounters:  09/18/19 (!) 305 lb (138.3 kg)  09/11/19 (!) 301 lb (136.5 kg)  09/04/19 (!) 304 lb 3.2 oz (138 kg)     Lab Results  Component Value Date    WBC 13.6 (H) 09/11/2019   HGB 11.5 (L) 09/11/2019   HCT 34.7 (L) 09/11/2019   PLT 254.0 09/11/2019   GLUCOSE 168 (H) 09/11/2019   CHOL 106 09/11/2019   TRIG 88.0 09/11/2019   HDL 34.50 (L) 09/11/2019   LDLDIRECT 73.0 12/04/2017   LDLCALC 54 09/11/2019   ALT 38 09/11/2019   AST 38 (H) 09/11/2019   NA 133 (L) 09/11/2019   K 4.6 09/11/2019   CL 104 09/11/2019   CREATININE 2.22 (H) 09/11/2019   BUN 56 (H) 09/11/2019   CO2 23 09/11/2019   TSH 0.76 09/11/2019   PSA 17.51 (H) 09/11/2019   HGBA1C 6.6 (H) 05/29/2019   MICROALBUR 0.2 09/05/2019    No results found.   Assessment & Plan:  Plan  I am having Alex Rocha maintain his diclofenac sodium, Accu-Chek SmartView, insulin NPH Human, losartan, BD Insulin Syringe U/F, fenofibrate, loratadine, levothyroxine, and ciprofloxacin.  No orders of the defined types were placed in this encounter.   Problem List Items Addressed This Visit    None    Visit Diagnoses    Dysuria    -  Primary   Relevant Orders   POCT Urinalysis Dipstick (Automated) (Completed)   Urine Culture   Stage 3b chronic kidney disease       Relevant Orders   Amb ref to Medical Nutrition Therapy-MNT      Follow-up: Return in about 6 months (around 03/19/2020), or if symptoms worsen or fail to improve, for hypertension, hyperlipidemia.  Ann Held, DO

## 2019-09-22 ENCOUNTER — Telehealth: Payer: Self-pay | Admitting: Medical

## 2019-09-22 ENCOUNTER — Ambulatory Visit: Payer: 59 | Admitting: Skilled Nursing Facility1

## 2019-09-22 NOTE — Telephone Encounter (Signed)
I had ordered rocephin 1 gram. Michelle from coding/billing is stating 500 mg ordered/documented from your end. Did you give 1 gram or 500 mg? If you gave 1 gram then can you change that. If you gave only 500 mg then I need to document correct amount on my note.

## 2019-09-25 NOTE — Telephone Encounter (Signed)
500 mg was given that was the day , we only had 500 mg rocephin and not 1 gram

## 2019-09-26 NOTE — Telephone Encounter (Signed)
Made addendum to day of service note regarding rocephin dosage given.

## 2019-09-28 ENCOUNTER — Encounter: Payer: Self-pay | Admitting: Family Medicine

## 2019-09-29 NOTE — Telephone Encounter (Signed)
I believe he is talking about uti--- can he come in tomorrow?

## 2019-09-29 NOTE — Telephone Encounter (Signed)
Appt scheduled for tomorrow.  °

## 2019-09-30 ENCOUNTER — Other Ambulatory Visit: Payer: Self-pay

## 2019-09-30 ENCOUNTER — Telehealth: Payer: 59 | Admitting: Family Medicine

## 2019-09-30 ENCOUNTER — Encounter: Payer: Self-pay | Admitting: Family Medicine

## 2019-09-30 VITALS — Temp 97.8°F | Ht 73.0 in

## 2019-09-30 DIAGNOSIS — R6883 Chills (without fever): Secondary | ICD-10-CM

## 2019-09-30 NOTE — Progress Notes (Signed)
Void visit Pt to be seen in office /thursday

## 2019-10-01 ENCOUNTER — Encounter: Payer: Self-pay | Admitting: Family Medicine

## 2019-10-02 ENCOUNTER — Encounter: Payer: Self-pay | Admitting: Family Medicine

## 2019-10-02 ENCOUNTER — Ambulatory Visit (INDEPENDENT_AMBULATORY_CARE_PROVIDER_SITE_OTHER): Payer: 59 | Admitting: Family Medicine

## 2019-10-02 ENCOUNTER — Other Ambulatory Visit: Payer: Self-pay

## 2019-10-02 VITALS — BP 138/70 | HR 80 | Temp 97.6°F | Resp 18 | Ht 73.0 in | Wt 306.2 lb

## 2019-10-02 DIAGNOSIS — R6883 Chills (without fever): Secondary | ICD-10-CM

## 2019-10-02 DIAGNOSIS — R350 Frequency of micturition: Secondary | ICD-10-CM

## 2019-10-02 DIAGNOSIS — N390 Urinary tract infection, site not specified: Secondary | ICD-10-CM | POA: Diagnosis not present

## 2019-10-02 DIAGNOSIS — E1151 Type 2 diabetes mellitus with diabetic peripheral angiopathy without gangrene: Secondary | ICD-10-CM

## 2019-10-02 DIAGNOSIS — IMO0002 Reserved for concepts with insufficient information to code with codable children: Secondary | ICD-10-CM

## 2019-10-02 DIAGNOSIS — R768 Other specified abnormal immunological findings in serum: Secondary | ICD-10-CM

## 2019-10-02 DIAGNOSIS — E1165 Type 2 diabetes mellitus with hyperglycemia: Secondary | ICD-10-CM | POA: Diagnosis not present

## 2019-10-02 LAB — POC URINALSYSI DIPSTICK (AUTOMATED)
Bilirubin, UA: NEGATIVE
Blood, UA: NEGATIVE
Glucose, UA: NEGATIVE
Ketones, UA: NEGATIVE
Leukocytes, UA: NEGATIVE
Nitrite, UA: NEGATIVE
Protein, UA: NEGATIVE
Spec Grav, UA: 1.02 (ref 1.010–1.025)
Urobilinogen, UA: 0.2 E.U./dL
pH, UA: 6 (ref 5.0–8.0)

## 2019-10-02 NOTE — Assessment & Plan Note (Signed)
hgba1c to be checked, minimize simple carbs. Increase exercise as tolerated. Continue current meds  

## 2019-10-02 NOTE — Progress Notes (Signed)
Patient ID: Alex Rocha, male    DOB: [birth year]  Age: 64 y.o. MRN: [id number]    Subjective:  Subjective  HPI Alex Rocha presents for chills --- and urinary frequency   No other symptoms   No fevers   HYPERTENSION   Blood pressure range-not checking   Chest pain- no      Dyspnea- no Lightheadedness- no   Edema- no  Other side effects - no   Medication compliance: good Low salt diet- yes     DIABETES    Blood Sugar ranges-60-130  Polyuria- no New Visual problems- no  Hypoglycemic symptoms- no  Other side effects-no Medication compliance - good Last eye exam- due    HYPERLIPIDEMIA  Medication compliance- good  RUQ pain- no  Muscle aches- no Other side effects-no    Review of Systems  Constitutional: Negative for appetite change, diaphoresis, fatigue and unexpected weight change.  Eyes: Negative for pain, redness and visual disturbance.  Respiratory: Negative for cough, chest tightness, shortness of breath and wheezing.   Cardiovascular: Negative for chest pain, palpitations and leg swelling.  Gastrointestinal: Negative for abdominal distention, abdominal pain, anal bleeding, blood in stool, constipation, diarrhea and nausea.  Endocrine: Negative for cold intolerance, heat intolerance, polydipsia, polyphagia and polyuria.  Genitourinary: Positive for frequency. Negative for decreased urine volume, difficulty urinating, discharge, dysuria, hematuria, penile pain, penile swelling, scrotal swelling, testicular pain and urgency.  Neurological: Negative for dizziness, light-headedness, numbness and headaches.    History Past Medical History:  Diagnosis Date  . Colon polyps   . Diabetes mellitus   . Environmental allergies   . Hyperlipidemia   . Hypertension   . Hyperthyroidism   . Kidney stones   . Low testosterone     He has no past surgical history on file.   His family history includes Colon cancer in his brother and mother; Diabetes in his mother and  another family member; Heart disease in his mother and sister; Hyperlipidemia in his brother; Hypertension in his mother.He reports that he has never smoked. He has never used smokeless tobacco. He reports current alcohol use. He reports that he does not use drugs.  Current Outpatient Medications on File Prior to Visit  Medication Sig Dispense Refill  . BD INSULIN SYRINGE U/F 31G X 5/16" 1 ML MISC 1 EACH BY OTHER ROUTE 2 (TWO) TIMES DAILY. AS DIRECTED 180 each 3  . diclofenac sodium (VOLTAREN) 1 % GEL APPLY 4 G TOPICALLY 4 (FOUR) TIMES DAILY. 100 g 1  . fenofibrate 160 MG tablet TAKE 1 TABLET BY MOUTH EVERY DAY 90 tablet 1  . glucose blood (ACCU-CHEK SMARTVIEW) test strip 1 each by Other route 2 (two) times a day. 100 each 12  . insulin NPH Human (HUMULIN N) 100 UNIT/ML injection INJECT 130 UNITS EACH MORNING AND 90 UNITS EACH EVENING 70 mL 13  . levothyroxine (SYNTHROID) 112 MCG tablet Take 1 tablet (112 mcg total) by mouth daily. 30 tablet 2  . loratadine (CLARITIN) 10 MG tablet TAKE 1 TABLET BY MOUTH EVERY DAY 30 tablet 5  . losartan (COZAAR) 100 MG tablet TAKE 1 TABLET BY MOUTH EVERY DAY 30 tablet 5   No current facility-administered medications on file prior to visit.     Objective:  Objective  Physical Exam Vitals reviewed.  Constitutional:      General: He is sleeping.     Appearance: He is well-developed.  HENT:     Head: Normocephalic and atraumatic.  Eyes:  Pupils: Pupils are equal, round, and reactive to light.  Neck:     Thyroid: No thyromegaly.  Cardiovascular:     Rate and Rhythm: Normal rate and regular rhythm.     Heart sounds: No murmur heard.   Pulmonary:     Effort: Pulmonary effort is normal. No respiratory distress.     Breath sounds: Normal breath sounds. No wheezing or rales.  Chest:     Chest wall: No tenderness.  Genitourinary:    Prostate: Normal. Not enlarged, not tender and no nodules present.     Rectum: Normal. Guaiac result negative. No mass,  tenderness, anal fissure or external hemorrhoid. Normal anal tone.  Musculoskeletal:        General: No tenderness.     Cervical back: Normal range of motion and neck supple.  Skin:    General: Skin is warm and dry.  Neurological:     Mental Status: He is oriented to person, place, and time.  Psychiatric:        Behavior: Behavior normal.        Thought Content: Thought content normal.        Judgment: Judgment normal.    BP 138/70 (BP Location: Left Arm, Patient Position: Sitting, Cuff Size: Large)   Pulse 80   Temp 97.6 F (36.4 C) (Temporal)   Resp 18   Ht 6\' 1"  (1.854 m)   Wt (!) 306 lb 3.2 oz (138.9 kg)   SpO2 96%   BMI 40.40 kg/m  Wt Readings from Last 3 Encounters:  10/02/19 (!) 306 lb 3.2 oz (138.9 kg)  09/18/19 (!) 305 lb (138.3 kg)  09/11/19 (!) 301 lb (136.5 kg)     Lab Results  Component Value Date   WBC 13.6 (H) 09/11/2019   HGB 11.5 (L) 09/11/2019   HCT 34.7 (L) 09/11/2019   PLT 254.0 09/11/2019   GLUCOSE 168 (H) 09/11/2019   CHOL 106 09/11/2019   TRIG 88.0 09/11/2019   HDL 34.50 (L) 09/11/2019   LDLDIRECT 73.0 12/04/2017   LDLCALC 54 09/11/2019   ALT 38 09/11/2019   AST 38 (H) 09/11/2019   NA 133 (L) 09/11/2019   K 4.6 09/11/2019   CL 104 09/11/2019   CREATININE 2.22 (H) 09/11/2019   BUN 56 (H) 09/11/2019   CO2 23 09/11/2019   TSH 0.76 09/11/2019   PSA 17.51 (H) 09/11/2019   HGBA1C 6.6 (H) 05/29/2019   MICROALBUR 0.2 09/05/2019    No results found.   Assessment & Plan:  Plan  I am having Alex Rocha maintain his diclofenac sodium, Accu-Chek SmartView, insulin NPH Human, losartan, BD Insulin Syringe U/F, fenofibrate, loratadine, and levothyroxine.  No orders of the defined types were placed in this encounter.   Problem List Items Addressed This Visit      Unprioritized   Chills (without fever)   Relevant Orders   CBC with Differential/Platelet   TSH   PSA   Comprehensive metabolic panel   Antinuclear Antib (ANA)    Rheumatoid Factor   Sedimentation rate   Testos,Total,Free and SHBG (Male)   DM (diabetes mellitus) type II uncontrolled, periph vascular disorder (HCC)    hgba1c to be checked , minimize simple carbs. Increase exercise as tolerated. Continue current meds       Uncontrolled type 2 diabetes mellitus with hyperglycemia ([place])    Check labs Pt states BS have been good       Relevant Orders   Hemoglobin A1c   Microalbumin / creatinine  urine ratio   Urinary frequency - Primary    ua normal  Prostate normal-- will check labs        Relevant Orders   CBC with Differential/Platelet   TSH   PSA   Comprehensive metabolic panel   Antinuclear Antib (ANA)   Rheumatoid Factor   Sedimentation rate   Urine Culture   Testos,Total,Free and SHBG (Male)   Urinary tract infection without hematuria   Relevant Orders   POCT Urinalysis Dipstick (Automated) (Completed)   Testos,Total,Free and SHBG (Male)      Follow-up: Return if symptoms worsen or fail to improve.  Ann Held, DO

## 2019-10-02 NOTE — Telephone Encounter (Signed)
Pt had appointment today.

## 2019-10-02 NOTE — Patient Instructions (Signed)
Urinary Frequency, Adult Urinary frequency means urinating more often than usual. You may urinate every 1-2 hours even though you drink a normal amount of fluid and do not have a bladder infection or condition. Although you urinate more often than normal, the total amount of urine produced in a day is normal. With urinary frequency, you may have an urgent need to urinate often. The stress and anxiety of needing to find a bathroom quickly can make this urge worse. This condition may go away on its own or you may need treatment at home. Home treatment may include bladder training, exercises, taking medicines, or making changes to your diet. Follow these instructions at home: Bladder health   Keep a bladder diary if told by your health care provider. Keep track of: ? What you eat and drink. ? How often you urinate. ? How much you urinate.  Follow a bladder training program if told by your health care provider. This may include: ? Learning to delay going to the bathroom. ? Double urinating (voiding). This helps if you are not completely emptying your bladder. ? Scheduled voiding.  Do Kegel exercises as told by your health care provider. Kegel exercises strengthen the muscles that help control urination, which may help the condition. Eating and drinking  If told by your health care provider, make diet changes, such as: ? Avoiding caffeine. ? Drinking fewer fluids, especially alcohol. ? Not drinking in the evening. ? Avoiding foods or drinks that may irritate the bladder. These include coffee, tea, soda, artificial sweeteners, citrus, tomato-based foods, and chocolate. ? Eating foods that help prevent or ease constipation. Constipation can make this condition worse. Your health care provider may recommend that you:  Drink enough fluid to keep your urine pale yellow.  Take over-the-counter or prescription medicines.  Eat foods that are high in fiber, such as beans, whole grains, and fresh  fruits and vegetables.  Limit foods that are high in fat and processed sugars, such as fried or sweet foods. General instructions  Take over-the-counter and prescription medicines only as told by your health care provider.  Keep all follow-up visits as told by your health care provider. This is important. Contact a health care provider if:  You start urinating more often.  You feel pain or irritation when you urinate.  You notice blood in your urine.  Your urine looks cloudy.  You develop a fever.  You begin vomiting. Get help right away if:  You are unable to urinate. Summary  Urinary frequency means urinating more often than usual. With urinary frequency, you may urinate every 1-2 hours even though you drink a normal amount of fluid and do not have a bladder infection or other bladder condition.  Your health care provider may recommend that you keep a bladder diary, follow a bladder training program, or make dietary changes.  If told by your health care provider, do Kegel exercises to strengthen the muscles that help control urination.  Take over-the-counter and prescription medicines only as told by your health care provider.  Contact a health care provider if your symptoms do not improve or get worse. This information is not intended to replace advice given to you by your health care provider. Make sure you discuss any questions you have with your health care provider. Document Revised: 10/11/2017 Document Reviewed: 10/11/2017 Elsevier Patient Education  2020 [employer]  

## 2019-10-02 NOTE — Assessment & Plan Note (Signed)
Check labs Pt states BS have been good

## 2019-10-02 NOTE — Assessment & Plan Note (Signed)
ua normal  Prostate normal-- will check labs

## 2019-10-03 LAB — COMPREHENSIVE METABOLIC PANEL
ALT: 38 U/L (ref 0–53)
AST: 36 U/L (ref 0–37)
Albumin: 4.7 g/dL (ref 3.5–5.2)
Alkaline Phosphatase: 33 U/L — ABNORMAL LOW (ref 39–117)
BUN: 21 mg/dL (ref 6–23)
CO2: 27 mEq/L (ref 19–32)
Calcium: 10.2 mg/dL (ref 8.4–10.5)
Chloride: 109 mEq/L (ref 96–112)
Creatinine, Ser: 1.51 mg/dL — ABNORMAL HIGH (ref 0.40–1.50)
GFR: 56.99 mL/min — ABNORMAL LOW (ref >60.00)
Glucose, Bld: 73 mg/dL (ref 70–99)
Potassium: 4.9 mEq/L (ref 3.5–5.1)
Sodium: 140 mEq/L (ref 135–145)
Total Bilirubin: 0.5 mg/dL (ref 0.2–1.2)
Total Protein: 7.2 g/dL (ref 6.0–8.3)

## 2019-10-03 LAB — CBC WITH DIFFERENTIAL/PLATELET
Basophils Absolute: 0.1 10*3/uL (ref 0.0–0.1)
Basophils Relative: 1.2 % (ref 0.0–3.0)
Eosinophils Absolute: 0.2 10*3/uL (ref 0.0–0.7)
Eosinophils Relative: 4.2 % (ref 0.0–5.0)
HCT: 39.6 % (ref 39.0–52.0)
Hemoglobin: 13.2 g/dL (ref 13.0–17.0)
Lymphocytes Relative: 43.5 % (ref 12.0–46.0)
Lymphs Abs: 2 10*3/uL (ref 0.7–4.0)
MCHC: 33.2 g/dL (ref 30.0–36.0)
MCV: 95.6 fl (ref 78.0–100.0)
Monocytes Absolute: 0.6 10*3/uL (ref 0.1–1.0)
Monocytes Relative: 13.6 % — ABNORMAL HIGH (ref 3.0–12.0)
Neutro Abs: 1.7 10*3/uL (ref 1.4–7.7)
Neutrophils Relative %: 37.5 % — ABNORMAL LOW (ref 43.0–77.0)
Platelets: 244 10*3/uL (ref 150.0–400.0)
RBC: 4.14 Mil/uL — ABNORMAL LOW (ref 4.22–5.81)
RDW: 14.6 % (ref 11.5–15.5)
WBC: 4.7 10*3/uL (ref 4.0–10.5)

## 2019-10-03 LAB — PSA: PSA: 5.26 ng/mL — ABNORMAL HIGH (ref 0.10–4.00)

## 2019-10-03 LAB — HEMOGLOBIN A1C: Hgb A1c MFr Bld: 5.9 % (ref 4.6–6.5)

## 2019-10-03 LAB — MICROALBUMIN / CREATININE URINE RATIO
Creatinine,U: 123.7 mg/dL
Microalb Creat Ratio: 0.6 mg/g (ref 0.0–30.0)
Microalb, Ur: <0.7 mg/dL (ref 0.0–1.9)

## 2019-10-03 LAB — TSH: TSH: 1.47 u[IU]/mL (ref 0.35–4.50)

## 2019-10-03 LAB — SEDIMENTATION RATE: Sed Rate: 11 mm/hr (ref 0–20)

## 2019-10-06 LAB — ANTI-NUCLEAR AB-TITER (ANA TITER): ANA Titer 1: 1:40 {titer} — ABNORMAL HIGH

## 2019-10-06 LAB — RHEUMATOID FACTOR: Rheumatoid fact SerPl-aCnc: <14 IU/mL (ref <14)

## 2019-10-06 LAB — URINE CULTURE
MICRO NUMBER:: 10603279
Result:: NO GROWTH
SPECIMEN QUALITY:: ADEQUATE

## 2019-10-06 LAB — ANA: Anti Nuclear Antibody (ANA): POSITIVE — AB

## 2019-10-06 LAB — TESTOS,TOTAL,FREE AND SHBG (FEMALE)
Free Testosterone: 67.6 pg/mL (ref 35.0–155.0)
Sex Hormone Binding: 35 nmol/L (ref 22–77)
Testosterone, Total, LC-MS-MS: 490 ng/dL (ref 250–1100)

## 2019-10-07 NOTE — Addendum Note (Signed)
Addended byDamita Dunnings D on: 10/07/2019 01:25 PM   Modules accepted: Orders

## 2019-10-12 ENCOUNTER — Other Ambulatory Visit: Payer: Self-pay | Admitting: Family Medicine

## 2019-10-22 ENCOUNTER — Encounter: Payer: 59 | Attending: Family Medicine | Admitting: Skilled Nursing Facility1

## 2019-10-22 ENCOUNTER — Encounter: Payer: Self-pay | Admitting: Skilled Nursing Facility1

## 2019-10-22 ENCOUNTER — Other Ambulatory Visit: Payer: Self-pay

## 2019-10-22 DIAGNOSIS — N183 Chronic kidney disease, stage 3 unspecified: Secondary | ICD-10-CM | POA: Diagnosis present

## 2019-10-22 NOTE — Progress Notes (Signed)
°  Assessment:  Primary concerns today: CKD-stage 3.   Pt states he does feel better since getting the shots for his testosterone.  A1C 5.9 Pt states he does not check his blood sugars. Pt states he does not have much of an appetite  Pt states since being dx with kidney disease: no sugary beverages, no carbonation, no high potassium foods, no longer adding salt to his foods, limiting his red meat.  Pt states due to a back injury he cannot golf like he likes but is just now getting back into.  Pt states he works 2-3 days a week. Pt states he does not eat any packaged or processed foods.   Pt has made a lot of excellent changes that will continue due to his strong motivation not to end up on dialysis.  MEDICATIONS: See List   DIETARY INTAKE:  Usual eating pattern includes 2  meals and 5+ snacks per day.  Everyday foods include fruit.  Avoided foods include high potassium.    24-hr recall: snacking on fruit all the way to lunch B ( AM): dried cranberries Snk ( AM):  fruit L ( PM): salad: cucumbers, lettuce, carrots, onions, peppers Snk ( PM): pack of crackers  D ( PM): chicken salad or cabbage with smoked [place] Snk ( PM): fruit Beverages: water sometimes with sugar free cranberry juice  Usual physical activity: ADL's  Estimated energy needs: 2000 calories 225 g carbohydrates 150 g protein 56 g fat  Progress Towards Goal(s):  In progress.   Nutritional Diagnosis:  NB-1.1 Food and nutrition-related knowledge deficit As related to newly diagnosed CKD stage 3.  As evidenced by referral and 24 hr recall.    Intervention:  Nutrition counseling. Dietitian educated pt on a healthy diet within the context of CKD and DM.   Goals: Limit your portion sizes: 1/2 cup -1 cup grits, 2 sausage patties, 1 sandwich, 1/2 cup - 1cup rice, 3 servings of fruit per DAY 1/4 cup of nuts is 1 serving  Aim to eat every 3 hours (not in between that time)  Teaching Method Utilized:   Visual Auditory Hands on  Handouts given during visit include:  MyPlate for Kidney disease   Barriers to learning/adherence to lifestyle change: none identified   Demonstrated degree of understanding via:  Teach Back   Monitoring/Evaluation:  Dietary intake, exercise, and body weight prn.

## 2019-11-11 ENCOUNTER — Other Ambulatory Visit: Payer: Self-pay | Admitting: Family Medicine

## 2019-11-13 ENCOUNTER — Other Ambulatory Visit: Payer: Self-pay | Admitting: Family Medicine

## 2019-12-09 ENCOUNTER — Encounter: Payer: Self-pay | Admitting: Family Medicine

## 2019-12-09 ENCOUNTER — Telehealth (INDEPENDENT_AMBULATORY_CARE_PROVIDER_SITE_OTHER): Payer: 59 | Admitting: Family Medicine

## 2019-12-09 ENCOUNTER — Other Ambulatory Visit: Payer: Self-pay

## 2019-12-09 DIAGNOSIS — H66002 Acute suppurative otitis media without spontaneous rupture of ear drum, left ear: Secondary | ICD-10-CM | POA: Diagnosis not present

## 2019-12-09 MED ORDER — AMOXICILLIN-POT CLAVULANATE 875-125 MG PO TABS: 1.0000 | ORAL_TABLET | Freq: Two times a day (BID) | ORAL | 0 refills | Status: DC

## 2019-12-09 NOTE — Progress Notes (Signed)
Virtual Visit via Video Note  I connected with Alex Rocha on 12/09/19 at  4:00 PM EDT by a video enabled telemedicine application and verified that I am speaking with the correct person using two identifiers.  Location: Patient: in car alone  Provider: office    I discussed the limitations of evaluation and management by telemedicine and the availability of in person appointments. The patient expressed understanding and agreed to proceed.  History of Present Illness Pt is in his car c/o chills/ fever since Friday.  Temp   100.8 , 102.4 He has been taking tylenol.    When he got to work he had a covid test --neg and he c/o L ear pain and a nurse at work looked at his ear and it looked infected  Observations/Objective: 100.8   Pt is in nAD   Assessment and Plan: 1. Non-recurrent acute suppurative otitis media of left ear without spontaneous rupture of tympanic membrane abx and ov Thursday or Friday covid neg  - amoxicillin-clavulanate (AUGMENTIN) 875-125 MG tablet; Take 1 tablet by mouth 2 (two) times daily.  Dispense: 20 tablet; Refill: 0   Follow Up Instructions:    I discussed the assessment and treatment plan with the patient. The patient was provided an opportunity to ask questions and all were answered. The patient agreed with the plan and demonstrated an understanding of the instructions.   The patient was advised to call back or seek an in-person evaluation if the symptoms worsen or if the condition fails to improve as anticipated.  I provided 25 minutes of non-face-to-face time during this encounter.   Ann Held, DO

## 2019-12-09 NOTE — Telephone Encounter (Signed)
Patient called stating he would like to know if he needs to come in to the lab or if an antibiotic can be sent in. Please advise as soon as possible.

## 2019-12-09 NOTE — Telephone Encounter (Signed)
Definitely needs virtual visit first and covid test  He does NOT need to be at work

## 2019-12-10 ENCOUNTER — Telehealth: Payer: Self-pay | Admitting: Family Medicine

## 2019-12-10 ENCOUNTER — Encounter: Payer: Self-pay | Admitting: Family Medicine

## 2019-12-10 NOTE — Telephone Encounter (Signed)
New Message:   Pt is calling and states that he was told to call if he continues to have some of the same issues or new issues since he visit the other day. Pt states a antibiotic shot was discussed and he would like to discuss it. Please advise.

## 2019-12-10 NOTE — Telephone Encounter (Signed)
Please advise 

## 2019-12-10 NOTE — Telephone Encounter (Signed)
Pt seen virtually yesterday and pcp made aware of results

## 2019-12-11 NOTE — Telephone Encounter (Signed)
Pt has appointment tomorrow.

## 2019-12-12 ENCOUNTER — Ambulatory Visit: Payer: 59 | Admitting: Family Medicine

## 2019-12-12 ENCOUNTER — Encounter: Payer: Self-pay | Admitting: Family Medicine

## 2019-12-12 ENCOUNTER — Other Ambulatory Visit: Payer: Self-pay

## 2019-12-12 VITALS — BP 126/76 | HR 79 | Temp 99.3°F | Resp 18 | Ht 73.0 in | Wt 310.6 lb

## 2019-12-12 DIAGNOSIS — H6692 Otitis media, unspecified, left ear: Secondary | ICD-10-CM

## 2019-12-12 NOTE — Patient Instructions (Signed)
Otitis Media, Adult  Otitis media means that the middle ear is red and swollen (inflamed) and full of fluid. The condition usually goes away on its own. Follow these instructions at home:  Take over-the-counter and prescription medicines only as told by your doctor.  If you were prescribed an antibiotic medicine, take it as told by your doctor. Do not stop taking the antibiotic even if you start to feel better.  Keep all follow-up visits as told by your doctor. This is important. Contact a doctor if:  You have bleeding from your nose.  There is a lump on your neck.  You are not getting better in 5 days.  You feel worse instead of better. Get help right away if:  You have pain that is not helped with medicine.  You have swelling, redness, or pain around your ear.  You get a stiff neck.  You cannot move part of your face (paralyzed).  You notice that the bone behind your ear hurts when you touch it.  You get a very bad headache. Summary  Otitis media means that the middle ear is red, swollen, and full of fluid.  This condition usually goes away on its own. In some cases, treatment may be needed.  If you were prescribed an antibiotic medicine, take it as told by your doctor. This information is not intended to replace advice given to you by your health care provider. Make sure you discuss any questions you have with your health care provider. Document Revised: 03/16/2017 Document Reviewed: 04/24/2016 Elsevier Patient Education  2020 [employer]  

## 2019-12-12 NOTE — Telephone Encounter (Signed)
Pt seen in the office today.  

## 2019-12-12 NOTE — Progress Notes (Signed)
Patient ID: Alex Rocha, male    DOB: [birth year]  Age: 64 y.o. MRN: [id number]    Subjective:  Subjective  HPI DASHAUN ONSTOTT presents for L ear pain--  We did a virtual visit on Wednesday --- he started the abx and it is much better  Review of Systems  Constitutional: Negative for appetite change, diaphoresis, fatigue and unexpected weight change.  HENT: Positive for ear pain.   Eyes: Negative for pain, redness and visual disturbance.  Respiratory: Negative for cough, chest tightness, shortness of breath and wheezing.   Cardiovascular: Negative for chest pain, palpitations and leg swelling.  Endocrine: Negative for cold intolerance, heat intolerance, polydipsia, polyphagia and polyuria.  Genitourinary: Negative for difficulty urinating, dysuria and frequency.  Neurological: Negative for dizziness, light-headedness, numbness and headaches.    History Past Medical History:  Diagnosis Date  . Colon polyps   . Diabetes mellitus   . Environmental allergies   . Hyperlipidemia   . Hypertension   . Hyperthyroidism   . Kidney stones   . Low testosterone     He has no past surgical history on file.   His family history includes Colon cancer in his brother and mother; Diabetes in his mother and another family member; Heart disease in his mother and sister; Hyperlipidemia in his brother; Hypertension in his mother.He reports that he has never smoked. He has never used smokeless tobacco. He reports current alcohol use. He reports that he does not use drugs.  Current Outpatient Medications on File Prior to Visit  Medication Sig Dispense Refill  . amoxicillin-clavulanate (AUGMENTIN) 875-125 MG tablet Take 1 tablet by mouth 2 (two) times daily. 20 tablet 0  . BD INSULIN SYRINGE U/F 31G X 5/16" 1 ML MISC 1 EACH BY OTHER ROUTE 2 (TWO) TIMES DAILY. AS DIRECTED 180 each 3  . diclofenac sodium (VOLTAREN) 1 % GEL APPLY 4 G TOPICALLY 4 (FOUR) TIMES DAILY. 100 g 1  . fenofibrate 160 MG tablet  TAKE 1 TABLET BY MOUTH EVERY DAY 90 tablet 1  . glucose blood (ACCU-CHEK SMARTVIEW) test strip 1 each by Other route 2 (two) times a day. 100 each 12  . insulin NPH Human (HUMULIN N) 100 UNIT/ML injection INJECT 130 UNITS EACH MORNING AND 90 UNITS EACH EVENING 70 mL 13  . levothyroxine (SYNTHROID) 112 MCG tablet TAKE 1 TABLET BY MOUTH EVERY DAY 30 tablet 2  . loratadine (CLARITIN) 10 MG tablet TAKE 1 TABLET BY MOUTH EVERY DAY 30 tablet 5  . losartan (COZAAR) 100 MG tablet TAKE 1 TABLET BY MOUTH EVERY DAY 30 tablet 5   No current facility-administered medications on file prior to visit.     Objective:  Objective  Physical Exam Vitals and nursing note reviewed.  Constitutional:      General: He is sleeping.     Appearance: He is well-developed.  HENT:     Head: Normocephalic and atraumatic.     Right Ear: Tympanic membrane, ear canal and external ear normal.     Left Ear: Tympanic membrane, ear canal and external ear normal.     Nose: Nose normal.  Eyes:     Pupils: Pupils are equal, round, and reactive to light.  Neck:     Thyroid: No thyromegaly.  Cardiovascular:     Rate and Rhythm: Normal rate and regular rhythm.     Heart sounds: No murmur heard.   Pulmonary:     Effort: Pulmonary effort is normal. No respiratory distress.  Breath sounds: Normal breath sounds. No wheezing or rales.  Chest:     Chest wall: No tenderness.  Musculoskeletal:        General: No tenderness.     Cervical back: Normal range of motion and neck supple.  Skin:    General: Skin is warm and dry.  Neurological:     Mental Status: He is oriented to person, place, and time.  Psychiatric:        Behavior: Behavior normal.        Thought Content: Thought content normal.        Judgment: Judgment normal.    BP 126/76 (BP Location: Right Arm, Patient Position: Sitting, Cuff Size: Large)   Pulse 79   Temp 99.3 F (37.4 C) (Oral)   Resp 18   Ht 6\' 1"  (1.854 m)   Wt (!) 310 lb 9.6 oz (140.9 kg)    SpO2 97%   BMI 40.98 kg/m  Wt Readings from Last 3 Encounters:  12/12/19 (!) 310 lb 9.6 oz (140.9 kg)  10/02/19 (!) 306 lb 3.2 oz (138.9 kg)  09/18/19 (!) 305 lb (138.3 kg)     Lab Results  Component Value Date   WBC 4.7 10/02/2019   HGB 13.2 10/02/2019   HCT 39.6 10/02/2019   PLT 244.0 10/02/2019   GLUCOSE 73 10/02/2019   CHOL 106 09/11/2019   TRIG 88.0 09/11/2019   HDL 34.50 (L) 09/11/2019   LDLDIRECT 73.0 12/04/2017   LDLCALC 54 09/11/2019   ALT 38 10/02/2019   AST 36 10/02/2019   NA 140 10/02/2019   K 4.9 10/02/2019   CL 109 10/02/2019   CREATININE 1.51 (H) 10/02/2019   BUN 21 10/02/2019   CO2 27 10/02/2019   TSH 1.47 10/02/2019   PSA 5.26 (H) 10/02/2019   HGBA1C 5.9 10/02/2019   MICROALBUR <0.7 10/02/2019    No results found.   Assessment & Plan:  Plan  I am having Josel L. Maselli maintain his diclofenac sodium, Accu-Chek SmartView, insulin NPH Human, BD Insulin Syringe U/F, fenofibrate, loratadine, losartan, levothyroxine, and amoxicillin-clavulanate.  No orders of the defined types were placed in this encounter.   Problem List Items Addressed This Visit      Unprioritized   Left otitis media - Primary    Improving  Finish the abx rto prn          Follow-up: Return if symptoms worsen or fail to improve.  Ann Held, DO

## 2019-12-14 NOTE — Assessment & Plan Note (Signed)
Improving  Finish the abx rto prn

## 2019-12-27 ENCOUNTER — Ambulatory Visit
Admission: RE | Admit: 2019-12-27 | Discharge: 2019-12-27 | Disposition: A | Discharge status: Home / Self Care | Payer: 59 | Source: Ambulatory Visit | Attending: Urology | Admitting: Urology

## 2019-12-27 DIAGNOSIS — R972 Elevated prostate specific antigen [PSA]: Secondary | ICD-10-CM

## 2019-12-27 MED ORDER — GADOBENATE DIMEGLUMINE 529 MG/ML IV SOLN
20.0000 mL | Freq: Once | INTRAVENOUS | Status: AC | PRN
  Administered 2019-12-27: 20 mL via INTRAVENOUS

## 2020-01-26 ENCOUNTER — Other Ambulatory Visit: Payer: Self-pay | Admitting: Family Medicine

## 2020-01-28 ENCOUNTER — Telehealth: Payer: Self-pay | Admitting: Family Medicine

## 2020-01-28 NOTE — Telephone Encounter (Signed)
CallerJohntae Broxterman  Call Back # [phone]  Patient called regarding changing locations for his pharmacy preferences [facility] a different [facility] location closer to new residence.  I recommended [facility] to [facility] as well, but just wanted to document in here as well.  [facility]

## 2020-01-28 NOTE — Telephone Encounter (Signed)
Pharmacy list has been updated and preferred pharmacy has been noted.

## 2020-02-01 ENCOUNTER — Other Ambulatory Visit: Payer: Self-pay | Admitting: Endocrinology

## 2020-02-01 DIAGNOSIS — E119 Type 2 diabetes mellitus without complications: Secondary | ICD-10-CM

## 2020-02-01 DIAGNOSIS — E890 Postprocedural hypothyroidism: Secondary | ICD-10-CM

## 2020-02-09 ENCOUNTER — Other Ambulatory Visit: Payer: Self-pay | Admitting: Family Medicine

## 2020-02-11 ENCOUNTER — Other Ambulatory Visit: Payer: Self-pay | Admitting: Family Medicine

## 2020-02-11 DIAGNOSIS — J069 Acute upper respiratory infection, unspecified: Secondary | ICD-10-CM

## 2020-03-09 ENCOUNTER — Other Ambulatory Visit: Payer: Self-pay

## 2020-03-09 ENCOUNTER — Encounter: Payer: Self-pay | Admitting: Family Medicine

## 2020-03-09 ENCOUNTER — Ambulatory Visit: Payer: 59 | Admitting: Family Medicine

## 2020-03-09 VITALS — BP 130/80 | HR 78 | Temp 98.2°F | Resp 18 | Ht 73.0 in | Wt 299.6 lb

## 2020-03-09 DIAGNOSIS — E785 Hyperlipidemia, unspecified: Secondary | ICD-10-CM | POA: Diagnosis not present

## 2020-03-09 DIAGNOSIS — E1165 Type 2 diabetes mellitus with hyperglycemia: Secondary | ICD-10-CM | POA: Diagnosis not present

## 2020-03-09 DIAGNOSIS — E890 Postprocedural hypothyroidism: Secondary | ICD-10-CM

## 2020-03-09 DIAGNOSIS — N4232 Atypical small acinar proliferation of prostate: Secondary | ICD-10-CM | POA: Insufficient documentation

## 2020-03-09 DIAGNOSIS — E119 Type 2 diabetes mellitus without complications: Secondary | ICD-10-CM

## 2020-03-09 DIAGNOSIS — E1169 Type 2 diabetes mellitus with other specified complication: Secondary | ICD-10-CM

## 2020-03-09 DIAGNOSIS — I10 Essential (primary) hypertension: Secondary | ICD-10-CM | POA: Diagnosis not present

## 2020-03-09 LAB — COMPREHENSIVE METABOLIC PANEL
ALT: 35 U/L (ref 0–53)
AST: 43 U/L — ABNORMAL HIGH (ref 0–37)
Albumin: 4.7 g/dL (ref 3.5–5.2)
Alkaline Phosphatase: 40 U/L (ref 39–117)
BUN: 40 mg/dL — ABNORMAL HIGH (ref 6–23)
CO2: 29 mEq/L (ref 19–32)
Calcium: 10.1 mg/dL (ref 8.4–10.5)
Chloride: 104 mEq/L (ref 96–112)
Creatinine, Ser: 1.77 mg/dL — ABNORMAL HIGH (ref 0.40–1.50)
GFR: 40.76 mL/min — ABNORMAL LOW (ref >60.00)
Glucose, Bld: 158 mg/dL — ABNORMAL HIGH (ref 70–99)
Potassium: 4.8 mEq/L (ref 3.5–5.1)
Sodium: 139 mEq/L (ref 135–145)
Total Bilirubin: 0.5 mg/dL (ref 0.2–1.2)
Total Protein: 7.6 g/dL (ref 6.0–8.3)

## 2020-03-09 LAB — LIPID PANEL
Cholesterol: 140 mg/dL (ref 0–200)
HDL: 32.9 mg/dL — ABNORMAL LOW (ref >39.00)
LDL Cholesterol: 84 mg/dL (ref 0–99)
NonHDL: 107.28
Total CHOL/HDL Ratio: 4
Triglycerides: 117 mg/dL (ref 0.0–149.0)
VLDL: 23.4 mg/dL (ref 0.0–40.0)

## 2020-03-09 LAB — TSH: TSH: 2.42 u[IU]/mL (ref 0.35–4.50)

## 2020-03-09 LAB — HEMOGLOBIN A1C: Hgb A1c MFr Bld: 6.3 % (ref 4.6–6.5)

## 2020-03-09 MED ORDER — INSULIN NPH (HUMAN) (ISOPHANE) 100 UNIT/ML ~~LOC~~ SUSP: SUBCUTANEOUS | 13 refills | Status: DC

## 2020-03-09 NOTE — Progress Notes (Signed)
Patient ID: Alex Rocha, male    DOB: 08-Jan-1956  Age: 64 y.o. MRN: [id number]    Subjective:  Subjective  HPI SHEPHERD FINNAN presents for f/u bp , chol and dm.  He is seeing urology for prostate cancer  No other complaints   Review of Systems  Constitutional: Negative for appetite change, diaphoresis, fatigue and unexpected weight change.  Eyes: Negative for pain, redness and visual disturbance.  Respiratory: Negative for cough, chest tightness, shortness of breath and wheezing.   Cardiovascular: Negative for chest pain, palpitations and leg swelling.  Endocrine: Negative for cold intolerance, heat intolerance, polydipsia, polyphagia and polyuria.  Genitourinary: Negative for difficulty urinating, dysuria and frequency.  Neurological: Negative for dizziness, light-headedness, numbness and headaches.    History Past Medical History:  Diagnosis Date  . Colon polyps   . Diabetes mellitus   . Environmental allergies   . Hyperlipidemia   . Hypertension   . Hyperthyroidism   . Kidney stones   . Low testosterone     He has no past surgical history on file.   His family history includes Colon cancer in his brother and mother; Diabetes in his mother and another family member; Heart disease in his mother and sister; Hyperlipidemia in his brother; Hypertension in his mother.He reports that he has never smoked. He has never used smokeless tobacco. He reports current alcohol use. He reports that he does not use drugs.  Current Outpatient Medications on File Prior to Visit  Medication Sig Dispense Refill  . amoxicillin-clavulanate (AUGMENTIN) 875-125 MG tablet Take 1 tablet by mouth 2 (two) times daily. 20 tablet 0  . BD INSULIN SYRINGE U/F 31G X 5/16" 1 ML MISC 1 EACH BY OTHER ROUTE 2 (TWO) TIMES DAILY. AS DIRECTED 180 each 3  . diclofenac sodium (VOLTAREN) 1 % GEL APPLY 4 G TOPICALLY 4 (FOUR) TIMES DAILY. 100 g 1  . fenofibrate 160 MG tablet TAKE 1 TABLET BY MOUTH EVERY DAY 30  tablet 5  . glucose blood (ACCU-CHEK SMARTVIEW) test strip 1 each by Other route 2 (two) times a day. 100 each 12  . levothyroxine (SYNTHROID) 112 MCG tablet TAKE 1 TABLET BY MOUTH EVERY DAY 30 tablet 0  . loratadine (CLARITIN) 10 MG tablet TAKE 1 TABLET BY MOUTH EVERY DAY 30 tablet 5  . losartan (COZAAR) 100 MG tablet TAKE 1 TABLET BY MOUTH EVERY DAY 30 tablet 5   No current facility-administered medications on file prior to visit.     Objective:  Objective  Physical Exam Vitals and nursing note reviewed.  Constitutional:      General: He is sleeping.     Appearance: He is well-developed.  HENT:     Head: Normocephalic and atraumatic.  Eyes:     Pupils: Pupils are equal, round, and reactive to light.  Neck:     Thyroid: No thyromegaly.  Cardiovascular:     Rate and Rhythm: Normal rate and regular rhythm.     Heart sounds: No murmur heard.   Pulmonary:     Effort: Pulmonary effort is normal. No respiratory distress.     Breath sounds: Normal breath sounds. No wheezing or rales.  Chest:     Chest wall: No tenderness.  Musculoskeletal:        General: No tenderness.     Cervical back: Normal range of motion and neck supple.  Skin:    General: Skin is warm and dry.  Neurological:     Mental Status: He is oriented  to person, place, and time.  Psychiatric:        Behavior: Behavior normal.        Thought Content: Thought content normal.        Judgment: Judgment normal.    BP 130/80 (BP Location: Left Arm, Patient Position: Sitting, Cuff Size: Large)   Pulse 78   Temp 98.2 F (36.8 C) (Oral)   Resp 18   Ht 6\' 1"  (1.854 m)   Wt 299 lb 9.6 oz (135.9 kg)   SpO2 98%   BMI 39.53 kg/m  Wt Readings from Last 3 Encounters:  03/09/20 299 lb 9.6 oz (135.9 kg)  12/12/19 (!) 310 lb 9.6 oz (140.9 kg)  10/02/19 (!) 306 lb 3.2 oz (138.9 kg)     Lab Results  Component Value Date   WBC 4.7 10/02/2019   HGB 13.2 10/02/2019   HCT 39.6 10/02/2019   PLT 244.0 10/02/2019    GLUCOSE 73 10/02/2019   CHOL 106 09/11/2019   TRIG 88.0 09/11/2019   HDL 34.50 (L) 09/11/2019   LDLDIRECT 73.0 12/04/2017   LDLCALC 54 09/11/2019   ALT 38 10/02/2019   AST 36 10/02/2019   NA 140 10/02/2019   K 4.9 10/02/2019   CL 109 10/02/2019   CREATININE 1.51 (H) 10/02/2019   BUN 21 10/02/2019   CO2 27 10/02/2019   TSH 1.47 10/02/2019   PSA 5.26 (H) 10/02/2019   HGBA1C 5.9 10/02/2019   MICROALBUR <0.7 10/02/2019    MR PROSTATE W WO CONTRAST  Result Date: 12/27/2019 CLINICAL DATA:  Elevated PSA, previous biopsy showing atypia EXAM: MR PROSTATE WITHOUT AND WITH CONTRAST TECHNIQUE: Multiplanar multisequence MRI images were obtained of the pelvis centered about the prostate. Pre and post contrast images were obtained. CONTRAST:  90mL MULTIHANCE GADOBENATE DIMEGLUMINE 529 MG/ML IV SOLN COMPARISON:  03/04/2019 FINDINGS: Prostate: Diffuse intermediate signal throughout the peripheral zone of the prostate. Very mild changes of BPH. Area of heterogeneous dark appearance on the ADC map in the mid gland on the RIGHT without corresponding signal on the synthetic high B value image but without clearly wedge-shaped characteristics. No sign of early enhancement in this location. PIRADS category 3 diffuse peripheral zone enhancement is noted, uniform enhancement following the diffuse intermediate T2 signal. No focal transitional zone abnormality. Volume: 3.9 x 3.6 x 3.9 (volume = 29 cc) cm Transcapsular spread:  Absent Seminal vesicle involvement: Absent Neurovascular bundle involvement: Absent Pelvic adenopathy: Absent Bone metastasis: Absent Other findings: None IMPRESSION: PIRADS category 3 lesion in the RIGHT mid gland biased towards the apex on image 16 of on series 6 with heterogeneous diffusion properties and with other areas of generalized low signal on T2 and intermediate signal on diffusion. This is the most focal area in could also represent changes of prostatitis. (See below) There is diffuse  intermediate to low T2 signal throughout the peripheral zone focal area of restricted diffusion or enhancement. Findings could be related to prostatitis. Electronically Signed   By: Zetta Bills M.D.   On: 12/27/2019 17:08     Assessment & Plan:  Plan  I have changed Kailash L. Aragones's insulin NPH Human. I am also having him maintain his diclofenac sodium, Accu-Chek SmartView, BD Insulin Syringe U/F, losartan, amoxicillin-clavulanate, fenofibrate, levothyroxine, and loratadine.  Meds ordered this encounter  Medications  . insulin NPH Human (HUMULIN N) 100 UNIT/ML injection    Sig: INJECT 100  UNITS bid    Dispense:  70 mL    Refill:  13  Problem List Items Addressed This Visit      Unprioritized   Atypical small acinar proliferation of prostate    Per urology      HTN (hypertension)    Well controlled, no changes to meds. Encouraged heart healthy diet such as the DASH diet and exercise as tolerated.       Relevant Orders   Lipid panel   Hemoglobin A1c   Comprehensive metabolic panel   Hyperlipidemia LDL goal <70    Encouraged heart healthy diet, increase exercise, avoid trans fats, consider a krill oil cap daily      Hypothyroidism following radioiodine therapy    Check labs        Relevant Medications   insulin NPH Human (HUMULIN N) 100 UNIT/ML injection   Other Relevant Orders   TSH   Type 2 diabetes mellitus without complication, without long-term current use of insulin (HCC)    Check labs con't weight loss and exercise       Relevant Medications   insulin NPH Human (HUMULIN N) 100 UNIT/ML injection   Uncontrolled type 2 diabetes mellitus with hyperglycemia (HCC)   Relevant Medications   insulin NPH Human (HUMULIN N) 100 UNIT/ML injection   Other Relevant Orders   Lipid panel   Hemoglobin A1c   Comprehensive metabolic panel    Other Visit Diagnoses    Hyperlipidemia associated with type 2 diabetes mellitus ([place])    -  Primary   Relevant Medications     insulin NPH Human (HUMULIN N) 100 UNIT/ML injection   Other Relevant Orders   Lipid panel   Hemoglobin A1c   Comprehensive metabolic panel   Diabetes mellitus without complication (HCC)       Relevant Medications   insulin NPH Human (HUMULIN N) 100 UNIT/ML injection      Follow-up: Return in about 3 months (around 06/09/2020), or if symptoms worsen or fail to improve, for hypertension, hyperlipidemia, diabetes II.  Ann Held, DO

## 2020-03-09 NOTE — Patient Instructions (Addendum)
DASH Eating Plan DASH stands for "Dietary Approaches to Stop Hypertension." The DASH eating plan is a healthy eating plan that has been shown to reduce high blood pressure (hypertension). It may also reduce your risk for type 2 diabetes, heart disease, and stroke. The DASH eating plan may also help with weight loss. What are tips for following this plan?  General guidelines  Avoid eating more than 2,300 mg (milligrams) of salt (sodium) a day. If you have hypertension, you may need to reduce your sodium intake to 1,500 mg a day.  Limit alcohol intake to no more than 1 drink a day for nonpregnant women and 2 drinks a day for men. One drink equals 12 oz of beer, 5 oz of wine, or 1 oz of hard liquor.  Work with your health care provider to maintain a healthy body weight or to lose weight. Ask what an ideal weight is for you.  Get at least 30 minutes of exercise that causes your heart to beat faster (aerobic exercise) most days of the week. Activities may include walking, swimming, or biking.  Work with your health care provider or diet and nutrition specialist (dietitian) to adjust your eating plan to your individual calorie needs. Reading food labels   Check food labels for the amount of sodium per serving. Choose foods with less than 5 percent of the Daily Value of sodium. Generally, foods with less than 300 mg of sodium per serving fit into this eating plan.  To find whole grains, look for the word "whole" as the first word in the ingredient list. Shopping  Buy products labeled as "low-sodium" or "no salt added."  Buy fresh foods. Avoid canned foods and premade or frozen meals. Cooking  Avoid adding salt when cooking. Use salt-free seasonings or herbs instead of table salt or sea salt. Check with your health care provider or pharmacist before using salt substitutes.  Do not fry foods. Cook foods using healthy methods such as baking, boiling, grilling, and broiling instead.  Cook with  heart-healthy oils, such as olive, canola, soybean, or sunflower oil. Meal planning  Eat a balanced diet that includes: ? 5 or more servings of fruits and vegetables each day. At each meal, try to fill half of your plate with fruits and vegetables. ? Up to 6-8 servings of whole grains each day. ? Less than 6 oz of lean meat, poultry, or fish each day. A 3-oz serving of meat is about the same size as a deck of cards. One egg equals 1 oz. ? 2 servings of low-fat dairy each day. ? A serving of nuts, seeds, or beans 5 times each week. ? Heart-healthy fats. Healthy fats called Omega-3 fatty acids are found in foods such as flaxseeds and coldwater fish, like sardines, salmon, and mackerel.  Limit how much you eat of the following: ? Canned or prepackaged foods. ? Food that is high in trans fat, such as fried foods. ? Food that is high in saturated fat, such as fatty meat. ? Sweets, desserts, sugary drinks, and other foods with added sugar. ? Full-fat dairy products.  Do not salt foods before eating.  Try to eat at least 2 vegetarian meals each week.  Eat more home-cooked food and less restaurant, buffet, and fast food.  When eating at a restaurant, ask that your food be prepared with less salt or no salt, if possible. What foods are recommended? The items listed may not be a complete list. Talk with your dietitian about   what dietary choices are best for you. Grains Whole-grain or whole-wheat bread. Whole-grain or whole-wheat pasta. Brown rice. Oatmeal. Quinoa. Bulgur. Whole-grain and low-sodium cereals. Pita bread. Low-fat, low-sodium crackers. Whole-wheat flour tortillas. Vegetables Fresh or frozen vegetables (raw, steamed, roasted, or grilled). Low-sodium or reduced-sodium tomato and vegetable juice. Low-sodium or reduced-sodium tomato sauce and tomato paste. Low-sodium or reduced-sodium canned vegetables. Fruits All fresh, dried, or frozen fruit. Canned fruit in natural juice (without  added sugar). Meat and other protein foods Skinless chicken or turkey. Ground chicken or turkey. Pork with fat trimmed off. Fish and seafood. Egg whites. Dried beans, peas, or lentils. Unsalted nuts, nut butters, and seeds. Unsalted canned beans. Lean cuts of beef with fat trimmed off. Low-sodium, lean deli meat. Dairy Low-fat (1%) or fat-free (skim) milk. Fat-free, low-fat, or reduced-fat cheeses. Nonfat, low-sodium ricotta or cottage cheese. Low-fat or nonfat yogurt. Low-fat, low-sodium cheese. Fats and oils Soft margarine without trans fats. Vegetable oil. Low-fat, reduced-fat, or light mayonnaise and salad dressings (reduced-sodium). Canola, safflower, olive, soybean, and sunflower oils. Avocado. Seasoning and other foods Herbs. Spices. Seasoning mixes without salt. Unsalted popcorn and pretzels. Fat-free sweets. What foods are not recommended? The items listed may not be a complete list. Talk with your dietitian about what dietary choices are best for you. Grains Baked goods made with fat, such as croissants, muffins, or some breads. Dry pasta or rice meal packs. Vegetables Creamed or fried vegetables. Vegetables in a cheese sauce. Regular canned vegetables (not low-sodium or reduced-sodium). Regular canned tomato sauce and paste (not low-sodium or reduced-sodium). Regular tomato and vegetable juice (not low-sodium or reduced-sodium). Pickles. Olives. Fruits Canned fruit in a light or heavy syrup. Fried fruit. Fruit in cream or butter sauce. Meat and other protein foods Fatty cuts of meat. Ribs. Fried meat. Bacon. Sausage. Bologna and other processed lunch meats. Salami. Fatback. Hotdogs. Bratwurst. Salted nuts and seeds. Canned beans with added salt. Canned or smoked fish. Whole eggs or egg yolks. Chicken or turkey with skin. Dairy Whole or 2% milk, cream, and half-and-half. Whole or full-fat cream cheese. Whole-fat or sweetened yogurt. Full-fat cheese. Nondairy creamers. Whipped toppings.  Processed cheese and cheese spreads. Fats and oils Butter. Stick margarine. Lard. Shortening. Ghee. Bacon fat. Tropical oils, such as coconut, palm kernel, or palm oil. Seasoning and other foods Salted popcorn and pretzels. Onion salt, garlic salt, seasoned salt, table salt, and sea salt. Worcestershire sauce. Tartar sauce. Barbecue sauce. Teriyaki sauce. Soy sauce, including reduced-sodium. Steak sauce. Canned and packaged gravies. Fish sauce. Oyster sauce. Cocktail sauce. Horseradish that you find on the shelf. Ketchup. Mustard. Meat flavorings and tenderizers. Bouillon cubes. Hot sauce and Tabasco sauce. Premade or packaged marinades. Premade or packaged taco seasonings. Relishes. Regular salad dressings. Where to find more information:  National Heart, Lung, and Blood Institute: www.nhlbi.nih.gov  American Heart Association: www.heart.org Summary  The DASH eating plan is a healthy eating plan that has been shown to reduce high blood pressure (hypertension). It may also reduce your risk for type 2 diabetes, heart disease, and stroke.  With the DASH eating plan, you should limit salt (sodium) intake to 2,300 mg a day. If you have hypertension, you may need to reduce your sodium intake to 1,500 mg a day.  When on the DASH eating plan, aim to eat more fresh fruits and vegetables, whole grains, lean proteins, low-fat dairy, and heart-healthy fats.  Work with your health care provider or diet and nutrition specialist (dietitian) to adjust your eating plan to your   individual calorie needs. This information is not intended to replace advice given to you by your health care provider. Make sure you discuss any questions you have with your health care provider. Document Revised: 03/16/2017 Document Reviewed: 03/27/2016 Elsevier Patient Education  2020 Elsevier Inc.  

## 2020-03-09 NOTE — Assessment & Plan Note (Signed)
Check labs 

## 2020-03-09 NOTE — Assessment & Plan Note (Signed)
Well controlled, no changes to meds. Encouraged heart healthy diet such as the DASH diet and exercise as tolerated.  °

## 2020-03-09 NOTE — Assessment & Plan Note (Signed)
Encouraged heart healthy diet, increase exercise, avoid trans fats, consider a krill oil cap daily 

## 2020-03-09 NOTE — Assessment & Plan Note (Signed)
Per u rology 

## 2020-03-09 NOTE — Assessment & Plan Note (Signed)
Check labs con't weight loss and exercise

## 2020-03-10 ENCOUNTER — Other Ambulatory Visit: Payer: Self-pay | Admitting: Family Medicine

## 2020-03-10 DIAGNOSIS — I10 Essential (primary) hypertension: Secondary | ICD-10-CM

## 2020-03-10 DIAGNOSIS — E1165 Type 2 diabetes mellitus with hyperglycemia: Secondary | ICD-10-CM

## 2020-03-11 ENCOUNTER — Other Ambulatory Visit: Payer: Self-pay | Admitting: Family Medicine

## 2020-03-12 ENCOUNTER — Other Ambulatory Visit: Payer: Self-pay | Admitting: Family Medicine

## 2020-03-15 ENCOUNTER — Telehealth: Payer: Self-pay

## 2020-03-15 DIAGNOSIS — G8929 Other chronic pain: Secondary | ICD-10-CM

## 2020-03-15 DIAGNOSIS — M25561 Pain in right knee: Secondary | ICD-10-CM

## 2020-03-15 NOTE — Telephone Encounter (Signed)
Patient scheduled with Mackie Pai, PA-C on Thursday, 03/18/20.   [facility] Night - Client TELEPHONE ADVICE RECORD [facility] Patient Name: Alex Rocha Gender: Male DOB: 02-08-1956 Age: 64 Y 2 M Return Phone Number: [phone] (Primary), [phone] (Secondary) Address: City/State/Zip: [place] [address] Client [facility] Night - Client Client Site [facility] - Night Physician Roma Schanz- MD Contact Type Call Who Is Calling Patient / Member / Family / Caregiver Call Type Triage / Clinical Relationship To Patient Self Return Phone Number [phone] (Primary) Chief Complaint Walking difficulty Reason for Call Symptomatic / Request for [place] states he is having right knee problems and can barely walk. States he has had some pain for 6 mos but this morning it is worse than ever. Translation No Nurse Assessment Nurse: Gareth Eagle, RN, Raquel Sarna Date/Time Eilene Ghazi Time): 03/13/2020 1:47:46 PM Confirm and document reason for call. If symptomatic, describe symptoms. ---Caller states he has had R knee problems for past 6 months and has been able to walk thus far, but this morning pain is much worse and is having difficulty walking. Has old knee injury from high school but no injuries since then. Does the patient have any new or worsening symptoms? ---Yes Will a triage be completed? ---Yes Related visit to physician within the last 2 weeks? ---No Does the PT have any chronic conditions? (i.e. diabetes, asthma, this includes High risk factors for pregnancy, etc.) ---Yes List chronic conditions. ---diabetes Is this a behavioral health or substance abuse call? ---No Guidelines Guideline Title Affirmed Question Affirmed Notes Nurse Date/Time (Eastern Time) Knee Pain [1] Fluid-filled sack just below knee cap (i.e., inferior aspect of the anterior knee) AND [2] no fever or  redness Theodoro Grist 03/13/2020 1:49:47 PM Disp. Time Eilene Ghazi Time) Disposition Final User 03/13/2020 1:53:57 PM SEE PCP WITHIN 3 DAYS Yes Gareth Eagle, RN, Raquel Sarna PLEASE NOTE: All timestamps contained within this report are represented as [place] Standard Time. CONFIDENTIALTY NOTICE: This fax transmission is intended only for the addressee. It contains information that is legally privileged, confidential or otherwise protected from use or disclosure. If you are not the intended recipient, you are strictly prohibited from reviewing, disclosing, copying using or disseminating any of this information or taking any action in reliance on or regarding this information. If you have received this fax in error, please notify us immediately by telephone so that we can arrange for its return to [place]. Phone: [phone], Toll-Free: [phone], Fax: [phone] Page: 2 of 2 Call Id: [id number] [place] Disagree/Comply Comply Caller Understands Yes PreDisposition Did not know what to do Care Advice Given Per Guideline SEE PCP WITHIN 3 DAYS: * You need to be seen within 2 or 3 days. PAIN MEDICINES: * ACETAMINOPHEN - REGULAR STRENGTH TYLENOL: Take 650 mg (two 325 mg pills) by mouth every 4 to 6 hours as needed. Each Regular Strength Tylenol pill has 325 mg of acetaminophen. The most you should take each day is 3,250 mg (10 pills a day). * ACETAMINOPHEN - EXTRA STRENGTH TYLENOL: Take 1,000 mg (two 500 mg pills) every 8 hours as needed. Each Extra Strength Tylenol pill has 500 mg of acetaminophen. The most you should take each day is 3,000 mg (6 pills a day). AVOID FURTHER INJURY: * Avoid any further injury to the knee. * Do not kneel on the knee. ACE WRAP: * Apply an ace wrap loosely around the knee. Wrap from bottom to top. * Remove if toes  become numb or cold. * Remove at night. CALL BACK IF: * Fever or severe knee pain occurs * Redness or severe swelling occurs * You become worse CARE ADVICE given per Knee  Pain (Adult) guideline. Comments User: Tilden Fossa, RN Date/Time Eilene Ghazi Time): 03/13/2020 1:50:55 PM Cannot take NSAIDs due to decreased renal function (not on dialysis

## 2020-03-15 NOTE — Telephone Encounter (Signed)
Sent to [facility]. PT is scheduled for  Thursday 12/02

## 2020-03-15 NOTE — Telephone Encounter (Signed)
We could see if [facility] can [place]==== pt lives in [place] to place referral and have sherri call to see if he can be seen today or tomorrow

## 2020-03-18 ENCOUNTER — Ambulatory Visit: Payer: 59 | Admitting: Family Medicine

## 2020-03-18 ENCOUNTER — Ambulatory Visit: Payer: 59 | Admitting: Medical

## 2020-03-18 NOTE — Progress Notes (Signed)
Subjective:    I'm seeing this patient as a consultation for:  Dr. Carollee Herter. Note will be routed back to referring provider/PCP.  CC: R knee pain  I, Molly Weber, LAT, ATC, am serving as scribe for Dr. Lynne Leader.  HPI: Pt is a 64 y/o male presenting w/ R knee pain x 6 months w/ no known MOI that has been worsening recently.  He notes that he has been walking more recently for exercise and feels that may be contributing to his recent increase in knee pain.  He locates his pain to his R medial knee.   Radiating pain: No R knee swelling: slightly R knee mechanical symptoms: yes clunking and instability in the knee. Aggravating factors: walking / weight-bearing; at night; R knee flexion AROM Treatments tried: Tylenol; can't take NSAIDs; Voltaren gel; knee sleeves  Past medical history, Surgical history, Family history, Social history, Allergies, and medications have been entered into the medical record, reviewed.   Review of Systems: No new headache, visual changes, nausea, vomiting, diarrhea, constipation, dizziness, abdominal pain, skin rash, fevers, chills, night sweats, weight loss, swollen lymph nodes, body aches, joint swelling, muscle aches, chest pain, shortness of breath, mood changes, visual or auditory hallucinations.   Objective:    Vitals:   03/19/20 0814  BP: 140/68  Pulse: 70  SpO2: 98%   General: Well Developed, well nourished, and in no acute distress.  Neuro/Psych: Alert and oriented x3, extra-ocular muscles intact, able to move all 4 extremities, sensation grossly intact. Skin: Warm and dry, no rashes noted.  Respiratory: Not using accessory muscles, speaking in full sentences, trachea midline.  Cardiovascular: Pulses palpable, no extremity edema. Abdomen: Does not appear distended. MSK: Right knee moderate effusion otherwise normal-appearing Normal motion with crepitation. Tender palpation medial joint line. Slight laxity at [facility] stress test. No laxity  LCL stress test. Negative McMurray's test. Intact strength.  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  Mild to moderate patellofemoral DJD. No acute fractures visible. Await formal radiology review  Procedure: Real-time Ultrasound Guided Injection of right knee superior lateral patellar space Device: Philips Affiniti 50G Images permanently stored and available for review in PACS Inspection of knee with ultrasound prior to injection reveals moderate effusion.  Significant medial compartment DJD and extruded medial meniscus.  No Baker's cyst. Verbal informed consent obtained.  Discussed risks and benefits of procedure. Warned about infection bleeding damage to structures skin hypopigmentation and fat atrophy among others. Patient expresses understanding and agreement Time-out conducted.   Noted no overlying erythema, induration, or other signs of local infection.   Skin prepped in a sterile fashion.   Local anesthesia: Topical Ethyl chloride.   With sterile technique and under real time ultrasound guidance:  40 mg of Kenalog and 2 mL of Marcaine injected into joint capsule. Fluid seen entering the joint.   Completed without difficulty   Pain immediately resolved suggesting accurate placement of the medication.   Advised to call if fevers/chills, erythema, induration, drainage, or persistent bleeding.   Images permanently stored and available for review in the ultrasound unit.  Impression: Technically successful ultrasound guided injection.     Impression and Recommendations:    Assessment and Plan: 64 y.o. male with right knee pain due to DJD predominantly affecting the medial compartment.  Patient does have a bit of instability and may be a good candidate for medial off loader brace.  We discussed this  and he is thinking about it.  In the meantime recommend weight loss, quad  strengthening, Voltaren gel.  Also will proceed with steroid injection as above today.  Consider hyaluronic acid or PRP injection in the future.  Recheck as needed.  PDMP not reviewed this encounter. Orders Placed This Encounter  Procedures  . DG Knee AP/LAT W/Sunrise Right    Standing Status:   Future    Number of Occurrences:   1    Standing Expiration Date:   04/19/2020    Order Specific Question:   Reason for Exam (SYMPTOM  OR DIAGNOSIS REQUIRED)    Answer:   R knee pain    Order Specific Question:   Preferred imaging location?    Answer:   [facility]  . [place] LIMITED JOINT SPACE STRUCTURES LOW RIGHT(NO LINKED CHARGES)    Order Specific Question:   Reason for Exam (SYMPTOM  OR DIAGNOSIS REQUIRED)    Answer:   R knee pain    Order Specific Question:   Preferred imaging location?    Answer:   [facility]   No orders of the defined types were placed in this encounter.   Discussed warning signs or symptoms. Please see discharge instructions. Patient expresses understanding.   The above documentation has been reviewed and is accurate and complete Lynne Leader, M.D.

## 2020-03-19 ENCOUNTER — Ambulatory Visit: Payer: 59 | Admitting: Family Medicine

## 2020-03-19 ENCOUNTER — Ambulatory Visit (INDEPENDENT_AMBULATORY_CARE_PROVIDER_SITE_OTHER): Payer: 59

## 2020-03-19 ENCOUNTER — Other Ambulatory Visit: Payer: Self-pay

## 2020-03-19 ENCOUNTER — Encounter: Payer: Self-pay | Admitting: Family Medicine

## 2020-03-19 ENCOUNTER — Ambulatory Visit: Payer: Self-pay

## 2020-03-19 VITALS — BP 140/68 | HR 70 | Ht 73.0 in | Wt 298.6 lb

## 2020-03-19 DIAGNOSIS — G8929 Other chronic pain: Secondary | ICD-10-CM | POA: Diagnosis not present

## 2020-03-19 DIAGNOSIS — M25561 Pain in right knee: Secondary | ICD-10-CM | POA: Diagnosis not present

## 2020-03-19 NOTE — Patient Instructions (Signed)
Thank you for coming in today.  Call or go to the ER if you develop a large red swollen joint with extreme pain or oozing puss.   Continue the voltaren gel.   Continue weight loss and quad strength. Bike should be ok.   Call or go to the ER if you develop a large red swollen joint with extreme pain or oozing puss.   Can do gel shots if needed.   Also could consider a custom medial offloader brace if needed.   Recheck with me as needed.

## 2020-03-22 NOTE — Progress Notes (Signed)
Right knee x-ray shows arthritis

## 2020-03-29 LAB — HM DIABETES EYE EXAM

## 2020-04-04 ENCOUNTER — Other Ambulatory Visit: Payer: Self-pay | Admitting: Family Medicine

## 2020-04-21 ENCOUNTER — Encounter: Payer: Self-pay | Admitting: Radiation Oncology

## 2020-04-22 ENCOUNTER — Telehealth: Payer: Self-pay | Admitting: *Deleted

## 2020-04-22 NOTE — Telephone Encounter (Signed)
LVM for call back to schedule an appointment with Dr. Manning.  °

## 2020-04-28 ENCOUNTER — Other Ambulatory Visit: Payer: Self-pay | Admitting: Family Medicine

## 2020-05-10 ENCOUNTER — Encounter: Payer: Self-pay | Admitting: Radiation Oncology

## 2020-05-10 NOTE — Progress Notes (Signed)
GU Location of Tumor / Histology: prostatic adenocarcinoma  If Prostate Cancer, Gleason Score is (3 + 3) and PSA is (5.59). Prostate volume: 37.4  Alex Rocha is a patient of Kathreen Cornfield and now [place]. Patient with history of BPH and low testosterone. Patient's brother with history of prostate cancer.  Biopsies of prostate (if applicable) revealed:    Past/Anticipated interventions by urology, if any: prescribed testosterone injections, prescribed tamsulosin, prostate biopsy, discontinued testosterone, prostate biopsy, referral to Dr. Tammi Klippel to discuss radiation options.  Past/Anticipated interventions by medical oncology, if any: no  Weight changes, if any: no  Bowel/Bladder complaints, if any: IPSS 8. SHIM 11. Denies dysuria or hematuria. Reports rare urinary leakage associated with urgency. Denies any bowel complaints.  Nausea/Vomiting, if any: no  Pain issues, if any:  Denies new pain. Reports chronic right knee pain related to arthritis  SAFETY ISSUES:  Prior radiation? no  Pacemaker/ICD? no  Possible current pregnancy? no, male patient  Is the patient on methotrexate? no  Current Complaints / other details:  65 year old male. Married with 2 children. Works as a Research officer, trade [employer].

## 2020-05-11 ENCOUNTER — Other Ambulatory Visit: Payer: Self-pay

## 2020-05-11 ENCOUNTER — Ambulatory Visit
Admission: RE | Admit: 2020-05-11 | Discharge: 2020-05-11 | Disposition: A | Discharge status: Home / Self Care | Payer: 59 | Source: Ambulatory Visit | Attending: Radiation Oncology | Admitting: Radiation Oncology

## 2020-05-11 ENCOUNTER — Encounter: Payer: Self-pay | Admitting: Radiation Oncology

## 2020-05-11 VITALS — Ht 74.0 in | Wt 297.0 lb

## 2020-05-11 DIAGNOSIS — C61 Malignant neoplasm of prostate: Secondary | ICD-10-CM | POA: Insufficient documentation

## 2020-05-11 HISTORY — DX: Malignant neoplasm of prostate: C61

## 2020-05-11 NOTE — Progress Notes (Signed)
Radiation Oncology         [phone] ________________________________  Initial Outpatient Consultation - Conducted via Telephone due to current COVID-19 concerns for limiting patient exposure  Name: Alex Rocha MRN: [id number]  Date: 05/11/2020  DOB: [birth year]  ME:QASTM Chase, Alferd Apa, DO  Festus Aloe, MD   REFERRING PHYSICIAN: Festus Aloe, MD  DIAGNOSIS: 65 y.o. gentleman with Stage T1c adenocarcinoma of the prostate with Gleason score of 3+3, and PSA of 5.59.    ICD-10-CM   1. Malignant neoplasm of prostate ([place])  C61     HISTORY OF PRESENT ILLNESS: Alex Rocha is a 65 y.o. male with a diagnosis of prostate cancer. He has been followed by [facility] since at least 2016 for low testosterone and hypogonadism, first with Dr. Risa Grill and then Dr. Gloriann Loan. He was started on testosterone supplementation in 09/2018 under the care of Dr. Gloriann Loan. His PSA increased to 5.73 in 03/2019 and to 8.93 in 04/2019.  Therefore, he proceeded to transrectal ultrasound with 12 biopsies of the prostate on 06/25/2019.  The prostate volume measured 36.54 cc.  His pathology was benign with one area of atypia.  His PSA decreased to 5.26 but remained elevated in 09/2019, so he proceeded to prostate MRI on 12/27/2019. MRI showed right mid gland lesion biased towards apex with heterogeneous diffusion (PI-RADS 3) and with other areas of generalized low signal on T2 and intermediate signal on diffusion; with diffuse intermediate to low T2 signal throughout the peripheral zone, which could be related to prostatitis.  The prostate volume was estimated to be 29 cc.  A repeat PSA on 01/06/2020 showed stable elevation at 5.59.  He proceeded to MRI fusion biopsy on 01/28/2020 under the care of Dr. Junious Silk, in Dr. Purvis Sheffield absence.  The prostate volume measured 37.36 cc by ultrasound. Out of 15 core biopsies, 4 were positive.  The maximum Gleason score was 3+3, and this was seen in all three ROI MRI lesion  samples (5-10% of each core) and a single right apex core (30%).  He met with Dr. Alinda Money on 03/02/2020 to discuss prostatectomy. At that time, he appeared to be leaning towards active surveillance.  The patient reviewed the biopsy results with his urologist and he has kindly been referred today for discussion of potential radiation treatment options.   PREVIOUS RADIATION THERAPY: No  PAST MEDICAL HISTORY:  Past Medical History:  Diagnosis Date  . Colon polyps   . Diabetes mellitus   . Environmental allergies   . Hyperlipidemia   . Hypertension   . Hyperthyroidism   . Kidney stones   . Low testosterone   . Prostate cancer ([place])       PAST SURGICAL HISTORY: Past Surgical History:  Procedure Laterality Date  . PROSTATE BIOPSY    . VASECTOMY      FAMILY HISTORY:  Family History  Problem Relation Age of Onset  . Colon cancer Mother   . Heart disease Mother   . Hypertension Mother        Mother's side of the family  . Diabetes Mother        Mother's side of the family  . Colon cancer Brother   . Prostate cancer Brother   . Hyperlipidemia Brother        Mother's side of the family  . Heart disease Sister   . Diabetes Other        father's family  . Cancer Paternal Aunt  unknown type  . Breast cancer Neg Hx   . Pancreatic cancer Neg Hx     SOCIAL HISTORY:  Social History   Socioeconomic History  . Marital status: Married    Spouse name: Not on file  . Number of children: 2  . Years of education: Not on file  . Highest education level: Not on file  Occupational History  . Occupation: Nurse, adult  Tobacco Use  . Smoking status: Never Smoker  . Smokeless tobacco: Never Used  Vaping Use  . Vaping Use: Never used  Substance and Sexual Activity  . Alcohol use: Yes    Alcohol/week: 0.0 standard drinks    Comment: social  . Drug use: No  . Sexual activity: Yes  Other Topics Concern  . Not on file  Social History Narrative  . Not on file    Social Determinants of Health   Financial Resource Strain: Not on file  Food Insecurity: Not on file  Transportation Needs: Not on file  Physical Activity: Not on file  Stress: Not on file  Social Connections: Not on file  Intimate Partner Violence: Not on file    ALLERGIES: [name] [yohimbine]  MEDICATIONS:  Current Outpatient Medications  Medication Sig Dispense Refill  . BD INSULIN SYRINGE U/F 31G X 5/16" 1 ML MISC 1 EACH BY OTHER ROUTE 2 (TWO) TIMES DAILY. AS DIRECTED 180 each 3  . diclofenac sodium (VOLTAREN) 1 % GEL APPLY 4 G TOPICALLY 4 (FOUR) TIMES DAILY. 100 g 1  . fenofibrate 160 MG tablet TAKE 1 TABLET BY MOUTH EVERY DAY 30 tablet 5  . glucose blood (ACCU-CHEK SMARTVIEW) test strip 1 each by Other route 2 (two) times a day. 100 each 12  . insulin NPH Human (HUMULIN N) 100 UNIT/ML injection INJECT 100  UNITS bid 70 mL 13  . levothyroxine (SYNTHROID) 112 MCG tablet TAKE 1 TABLET BY MOUTH EVERY DAY 30 tablet 2  . loratadine (CLARITIN) 10 MG tablet TAKE 1 TABLET BY MOUTH EVERY DAY 30 tablet 5  . losartan (COZAAR) 100 MG tablet TAKE 1 TABLET BY MOUTH EVERY DAY 30 tablet 1  . tamsulosin (FLOMAX) 0.4 MG CAPS capsule Take 0.4 mg by mouth daily.     No current facility-administered medications for this encounter.    REVIEW OF SYSTEMS:  On review of systems, the patient reports that he is doing well overall. He denies any chest pain, shortness of breath, cough, fevers, chills, night sweats, unintended weight changes. He denies any bowel disturbances, and denies abdominal pain, nausea or vomiting. He denies any new musculoskeletal or joint aches or pains. His IPSS was 8, indicating mild urinary symptoms. He reports rare urinary leakage associated with urgency. His SHIM was 11, indicating he has moderate erectile dysfunction. A complete review of systems is obtained and is otherwise negative.    PHYSICAL EXAM:  Wt Readings from Last 3 Encounters:  05/11/20 297 lb (134.7 kg)   03/19/20 298 lb 9.6 oz (135.4 kg)  03/09/20 299 lb 9.6 oz (135.9 kg)   Temp Readings from Last 3 Encounters:  03/09/20 98.2 F (36.8 C) (Oral)  12/12/19 99.3 F (37.4 C) (Oral)  10/02/19 97.6 F (36.4 C) (Temporal)   BP Readings from Last 3 Encounters:  03/19/20 140/68  03/09/20 130/80  12/12/19 126/76   Pulse Readings from Last 3 Encounters:  03/19/20 70  03/09/20 78  12/12/19 79   Pain Assessment Pain Score: 0-No pain (arthritic right knee)/10  Physical exam not performed in light  of telephone consult visit format.   KPS = 100  100 - Normal; no complaints; no evidence of disease. 90   - Able to carry on normal activity; minor signs or symptoms of disease. 80   - Normal activity with effort; some signs or symptoms of disease. 68   - Cares for self; unable to carry on normal activity or to do active work. 60   - Requires occasional assistance, but is able to care for most of his personal needs. 50   - Requires considerable assistance and frequent medical care. 40   - Disabled; requires special care and assistance. 30   - Severely disabled; hospital admission is indicated although death not imminent. 20   - Very sick; hospital admission necessary; active supportive treatment necessary. 10   - Moribund; fatal processes progressing rapidly. 0     - Dead  Karnofsky DA, Abelmann WH, Craver LS and Burchenal [facility] [phone]) The use of the nitrogen mustards in the palliative treatment of carcinoma: with particular reference to bronchogenic carcinoma Cancer 1 634-56  LABORATORY DATA:  Lab Results  Component Value Date   WBC 4.7 10/02/2019   HGB 13.2 10/02/2019   HCT 39.6 10/02/2019   MCV 95.6 10/02/2019   PLT 244.0 10/02/2019   Lab Results  Component Value Date   NA 139 03/09/2020   K 4.8 03/09/2020   CL 104 03/09/2020   CO2 29 03/09/2020   Lab Results  Component Value Date   ALT 35 03/09/2020   AST 43 (H) 03/09/2020   ALKPHOS 40 03/09/2020   BILITOT 0.5 03/09/2020      RADIOGRAPHY: No results found.    IMPRESSION/PLAN: This visit was conducted via Telephone to spare the patient unnecessary potential exposure in the healthcare setting during the current COVID-19 pandemic. 1. 65 y.o. gentleman with Stage T1c adenocarcinoma of the prostate with Gleason Score of 3+3, and PSA of 5.59. We discussed the patient's workup and outlined the nature of prostate cancer in this setting. The patient's T stage, Gleason's score, and PSA put him into the low risk group. Accordingly, he is eligible for a variety of potential treatment options including active surveillance, brachytherapy, 5.5 weeks of external radiation, or prostatectomy. We discussed the available radiation techniques, and focused on the details and logistics and delivery. We discussed and outlined the risks, benefits, short and long-term effects associated with radiotherapy and compared and contrasted these with prostatectomy. We discussed the role of SpaceOAR in reducing the rectal toxicity associated with radiotherapy. Given his low volume, low risk disease, our recommendation is in support of active surveillance. He appears to have a good understanding of his disease and our treatment recommendations.  He was encouraged to ask questions that were answered to his stated satisfaction.  At the end of the conversation, the patient is interested in moving forward with active surveillance. We enjoyed meeting with him today. We will share our discussion with Dr. Mena Goes and look forward to following along in his progress.  Of course, we would be more than happy to continue to participate in his care if clinically indicated in the future.   Given current concerns for patient exposure during the COVID-19 pandemic, this encounter was conducted via telephone. The patient was notified in advance and was offered a MyChart meeting to allow for face to face communication but unfortunately reported that he did not have the  appropriate resources/technology to support such a visit and instead preferred to proceed with telephone consult. The patient  has given verbal consent for this type of encounter. The time spent during this encounter was 45 minutes. The attendants for this meeting include Tyler Pita MD, Ashlyn Bruning PA-C, [place], and patient, ZACKARIAH VANDERPOL. During the encounter, Tyler Pita MD, Ashlyn Bruning PA-C, and scribe, Wilburn Mylar were located at [place].  Patient, TYREECE GELLES was located at home.    Nicholos Johns, PA-C    Tyler Pita, MD  [facility] Direct Dial: [phone]  Fax: [phone] [email or web address]  Skype  [email or web address]  This document serves as a record of services personally performed by Tyler Pita, MD and Freeman Caldron, PA-C. It was created on their behalf by Wilburn Mylar, a trained medical scribe. The creation of this record is based on the scribe's personal observations and the provider's statements to them. This document has been checked and approved by the attending provider.

## 2020-05-13 ENCOUNTER — Telehealth: Payer: Self-pay | Admitting: Family Medicine

## 2020-05-13 DIAGNOSIS — M79671 Pain in right foot: Secondary | ICD-10-CM

## 2020-05-13 DIAGNOSIS — M79672 Pain in left foot: Secondary | ICD-10-CM

## 2020-05-13 NOTE — Telephone Encounter (Signed)
Patient is requesting a referral to a podiatrist , patient states his feet are hurting and it's inferring with his quality of life. Patient has spoken with Dr Etter Sjogren about this in previous office visits.   Please Advise

## 2020-05-13 NOTE — Telephone Encounter (Signed)
Ok to refer to podiatry for foot pain

## 2020-05-13 NOTE — Telephone Encounter (Signed)
Referral placed.

## 2020-05-13 NOTE — Addendum Note (Signed)
Addended byDamita Dunnings D on: 05/13/2020 10:52 AM   Modules accepted: Orders

## 2020-05-14 ENCOUNTER — Ambulatory Visit (INDEPENDENT_AMBULATORY_CARE_PROVIDER_SITE_OTHER): Payer: 59

## 2020-05-14 ENCOUNTER — Ambulatory Visit: Payer: 59 | Admitting: Podiatry

## 2020-05-14 ENCOUNTER — Other Ambulatory Visit: Payer: Self-pay

## 2020-05-14 VITALS — BP 155/64 | HR 79 | Temp 98.1°F | Resp 16

## 2020-05-14 DIAGNOSIS — M722 Plantar fascial fibromatosis: Secondary | ICD-10-CM | POA: Diagnosis not present

## 2020-05-14 DIAGNOSIS — L603 Nail dystrophy: Secondary | ICD-10-CM

## 2020-05-14 DIAGNOSIS — L600 Ingrowing nail: Secondary | ICD-10-CM

## 2020-05-14 MED ORDER — GENTAMICIN SULFATE 0.1 % EX CREA: 1.0000 "application " | TOPICAL_CREAM | Freq: Two times a day (BID) | CUTANEOUS | 1 refills | Status: DC

## 2020-05-14 MED ORDER — METHYLPREDNISOLONE 4 MG PO TBPK: ORAL_TABLET | ORAL | 0 refills | Status: DC

## 2020-05-14 NOTE — Patient Instructions (Signed)

## 2020-05-14 NOTE — Progress Notes (Signed)
   Subjective: Patient presents today for evaluation of pain to the bilateral great toenail plates. Patient is concerned for possible ingrown nail.  Patient has a history of ingrown toenails and also a history of nail avulsion procedures to the bilateral great toes.  He states that he still experiences pain and tenderness with ingrowing nails.    Also the patient complains that he has had some arch pain to the left arch for several years off and on.  He is wondering if perhaps arch supports would help alleviate some of his symptoms.  He states that when he walks he has a noticeable limp due to the pain.  Patient presents today for further treatment and evaluation.  Past Medical History:  Diagnosis Date  . Colon polyps   . Diabetes mellitus   . Environmental allergies   . Hyperlipidemia   . Hypertension   . Hyperthyroidism   . Kidney stones   . Low testosterone   . Prostate cancer ([place])     Objective:  General: Well developed, nourished, in no acute distress, alert and oriented x3   Dermatology: Skin is warm, dry and supple bilateral.  Bilateral toenail plates are very symptomatic with light touch with evidence of an ingrowing nail. Pain on palpation noted to the border of the nail fold. The remaining nails appear unremarkable at this time. There are no open sores, lesions.  Vascular: Dorsalis Pedis artery and Posterior Tibial artery pedal pulses palpable. No lower extremity edema noted.   Neruologic: Grossly intact via light touch bilateral.  Musculoskeletal: Muscular strength within normal limits in all groups bilateral. Normal range of motion noted to all pedal and ankle joints.  There is some pain on palpation along the medial longitudinal arch of the left foot  Radiographic exam: Normal osseous mineralization.  Joint spaces preserved.  No fracture identified.  Assesement: #1 Paronychia with ingrowing nail bilateral great toenail plates #2  Plantar fasciitis/mid substance left  arch  Plan of Care:  1. Patient evaluated.  2. Discussed treatment alternatives and plan of care. Explained nail avulsion procedure and post procedure course to patient. 3. Patient opted for permanent total nail avulsion of the total bilateral hallux nail plates.  4. Prior to procedure, local anesthesia infiltration utilized using 3 ml of a 50:50 mixture of 2% plain lidocaine and 0.5% plain marcaine in a normal hallux block fashion and a betadine prep performed.  5. Partial permanent nail avulsion with chemical matrixectomy performed using [id number] applications of phenol followed by alcohol flush.  6. Light dressing applied. 7.  Prescription for Medrol Dosepak to help alleviate his plantar fasciitis.   8.  Patient cannot take NSAIDs due to CKD  9.  Prescription for gentamicin cream apply 2 times daily to the great toes  10.  Return to clinic 3 weeks.  *Retired [employer] [place] [employer].  Currently working security at [employer]  Edrick Kins, DPM [facility]  Dr. Edrick Kins, DPM    [address], [place] [address]                Office [phone]  Fax [phone]

## 2020-05-21 ENCOUNTER — Other Ambulatory Visit: Payer: Self-pay | Admitting: Family Medicine

## 2020-06-04 ENCOUNTER — Other Ambulatory Visit: Payer: Self-pay | Admitting: Endocrinology

## 2020-06-04 ENCOUNTER — Encounter: Payer: Self-pay | Admitting: Podiatry

## 2020-06-04 ENCOUNTER — Other Ambulatory Visit: Payer: Self-pay

## 2020-06-04 ENCOUNTER — Ambulatory Visit (INDEPENDENT_AMBULATORY_CARE_PROVIDER_SITE_OTHER): Payer: 59 | Admitting: Podiatry

## 2020-06-04 DIAGNOSIS — L600 Ingrowing nail: Secondary | ICD-10-CM | POA: Diagnosis not present

## 2020-06-04 DIAGNOSIS — L603 Nail dystrophy: Secondary | ICD-10-CM

## 2020-06-04 NOTE — Progress Notes (Signed)
   Subjective: 65 y.o. male presents today status post permanent nail avulsion procedure of the bilateral great toes that was performed on 05/14/2020.  Patient states he is doing well.  He has been soaking his feet and applying antibiotic cream as instructed.  He is also been wearing the postsurgical shoes.  No new complaints at this time.   Past Medical History:  Diagnosis Date  . Colon polyps   . Diabetes mellitus   . Environmental allergies   . Hyperlipidemia   . Hypertension   . Hyperthyroidism   . Kidney stones   . Low testosterone   . Prostate cancer ([place])     Objective: Skin is warm, dry and supple. Nail and respective nail bed appears to be healing appropriately. Open wound to the associated nail bed with a granular wound base and moderate amount of fibrotic tissue. Minimal drainage noted. Mild erythema around the periungual region likely due to phenol chemical matricectomy.  Assessment: #1 postop permanent total nail avulsion bilateral great toes #2 open wound periungual nail fold of respective digit.   Plan of care: #1 patient was evaluated  #2  Light debridement of open wound was performed to the periungual border of the respective toe using a currette. Antibiotic ointment and Band-Aid was applied. #3  patient is to return to clinic on a PRN basis.   Edrick Kins, DPM [facility]  Dr. Edrick Kins, DPM    [address], [place] [address]                Office [phone]  Fax [phone]

## 2020-06-10 ENCOUNTER — Encounter: Payer: Self-pay | Admitting: Family Medicine

## 2020-06-10 ENCOUNTER — Ambulatory Visit (INDEPENDENT_AMBULATORY_CARE_PROVIDER_SITE_OTHER): Payer: 59 | Admitting: Family Medicine

## 2020-06-10 ENCOUNTER — Other Ambulatory Visit: Payer: Self-pay

## 2020-06-10 VITALS — BP 122/60 | HR 76 | Temp 98.4°F | Resp 18 | Wt 299.0 lb

## 2020-06-10 DIAGNOSIS — I10 Essential (primary) hypertension: Secondary | ICD-10-CM | POA: Diagnosis not present

## 2020-06-10 DIAGNOSIS — E890 Postprocedural hypothyroidism: Secondary | ICD-10-CM

## 2020-06-10 DIAGNOSIS — E785 Hyperlipidemia, unspecified: Secondary | ICD-10-CM | POA: Diagnosis not present

## 2020-06-10 DIAGNOSIS — E039 Hypothyroidism, unspecified: Secondary | ICD-10-CM | POA: Diagnosis not present

## 2020-06-10 DIAGNOSIS — E1165 Type 2 diabetes mellitus with hyperglycemia: Secondary | ICD-10-CM

## 2020-06-10 DIAGNOSIS — E1169 Type 2 diabetes mellitus with other specified complication: Secondary | ICD-10-CM | POA: Diagnosis not present

## 2020-06-10 LAB — COMPREHENSIVE METABOLIC PANEL
ALT: 21 U/L (ref 0–53)
AST: 20 U/L (ref 0–37)
Albumin: 4.4 g/dL (ref 3.5–5.2)
Alkaline Phosphatase: 39 U/L (ref 39–117)
BUN: 33 mg/dL — ABNORMAL HIGH (ref 6–23)
CO2: 27 mEq/L (ref 19–32)
Calcium: 9.8 mg/dL (ref 8.4–10.5)
Chloride: 105 mEq/L (ref 96–112)
Creatinine, Ser: 1.61 mg/dL — ABNORMAL HIGH (ref 0.40–1.50)
GFR: 45.58 mL/min — ABNORMAL LOW (ref >60.00)
Glucose, Bld: 212 mg/dL — ABNORMAL HIGH (ref 70–99)
Potassium: 5 mEq/L (ref 3.5–5.1)
Sodium: 138 mEq/L (ref 135–145)
Total Bilirubin: 0.6 mg/dL (ref 0.2–1.2)
Total Protein: 7 g/dL (ref 6.0–8.3)

## 2020-06-10 LAB — LIPID PANEL
Cholesterol: 192 mg/dL (ref 0–200)
HDL: 30.7 mg/dL — ABNORMAL LOW (ref >39.00)
NonHDL: 161.37
Total CHOL/HDL Ratio: 6
Triglycerides: 304 mg/dL — ABNORMAL HIGH (ref 0.0–149.0)
VLDL: 60.8 mg/dL — ABNORMAL HIGH (ref 0.0–40.0)

## 2020-06-10 LAB — MICROALBUMIN / CREATININE URINE RATIO
Creatinine,U: 158 mg/dL
Microalb Creat Ratio: 0.6 mg/g (ref 0.0–30.0)
Microalb, Ur: 0.9 mg/dL (ref 0.0–1.9)

## 2020-06-10 LAB — LDL CHOLESTEROL, DIRECT: Direct LDL: 107 mg/dL

## 2020-06-10 LAB — TSH: TSH: 2.49 u[IU]/mL (ref 0.35–4.50)

## 2020-06-10 NOTE — Assessment & Plan Note (Signed)
Tolerating statin, encouraged heart healthy diet, avoid trans fats, minimize simple carbs and saturated fats. Increase exercise as tolerated 

## 2020-06-10 NOTE — Assessment & Plan Note (Signed)
Per endo °

## 2020-06-10 NOTE — Progress Notes (Signed)
Patient ID: Alex Rocha, male    DOB: June 26, 1955  Age: 65 y.o. MRN: [id number]    Subjective:  Subjective  HPI Alex Rocha presents for bp and cholesterol --- pt sees endo for dm and urology for his prostate.   Pt with no complaints   HPI HYPERTENSION   Blood pressure range-normal   Chest pain- no      Dyspnea- no Lightheadedness- no   Edema- no  Other side effects - no   Medication compliance: good Low salt diet- yes    DIABETES    Blood Sugar ranges-endo  Polyuria- no New Visual problems- no  Hypoglycemic symptoms- no  Other side effects-no Medication compliance - good    HYPERLIPIDEMIA  Medication compliance- good RUQ pain- no  Muscle aches- no Other side effects-no     Review of Systems  Constitutional: Negative for appetite change, diaphoresis, fatigue and unexpected weight change.  Eyes: Negative for pain, redness and visual disturbance.  Respiratory: Negative for cough, chest tightness, shortness of breath and wheezing.   Cardiovascular: Negative for chest pain, palpitations and leg swelling.  Endocrine: Negative for cold intolerance, heat intolerance, polydipsia, polyphagia and polyuria.  Genitourinary: Negative for difficulty urinating, dysuria and frequency.  Neurological: Negative for dizziness, light-headedness, numbness and headaches.    History Past Medical History:  Diagnosis Date  . Colon polyps   . Diabetes mellitus   . Environmental allergies   . Hyperlipidemia   . Hypertension   . Hyperthyroidism   . Kidney stones   . Low testosterone   . Prostate cancer [facility])     He has a past surgical history that includes Vasectomy and Prostate biopsy.   His family history includes Cancer in his paternal aunt; Colon cancer in his brother and mother; Diabetes in his mother and another family member; Heart disease in his mother and sister; Hyperlipidemia in his brother; Hypertension in his mother; Prostate cancer in his brother.He reports that  he has never smoked. He has never used smokeless tobacco. He reports current alcohol use. He reports that he does not use drugs.  Current Outpatient Medications on File Prior to Visit  Medication Sig Dispense Refill  . BD INSULIN SYRINGE U/F 31G X 5/16" 1 ML MISC 1 EACH BY OTHER ROUTE 2 (TWO) TIMES DAILY. AS DIRECTED 180 each 3  . diclofenac sodium (VOLTAREN) 1 % GEL APPLY 4 G TOPICALLY 4 (FOUR) TIMES DAILY. 100 g 1  . fenofibrate 160 MG tablet TAKE 1 TABLET BY MOUTH EVERY DAY 30 tablet 5  . gentamicin cream (GARAMYCIN) 0.1 % Apply 1 application topically 2 (two) times daily. 30 g 1  . glucose blood (ACCU-CHEK SMARTVIEW) test strip 1 each by Other route 2 (two) times a day. 100 each 12  . insulin NPH Human (HUMULIN N) 100 UNIT/ML injection INJECT 100  UNITS bid 70 mL 13  . levothyroxine (SYNTHROID) 112 MCG tablet TAKE 1 TABLET BY MOUTH EVERY DAY 30 tablet 2  . loratadine (CLARITIN) 10 MG tablet TAKE 1 TABLET BY MOUTH EVERY DAY 30 tablet 5  . losartan (COZAAR) 100 MG tablet Take 1 tablet (100 mg total) by mouth daily. 90 tablet 0  . tamsulosin (FLOMAX) 0.4 MG CAPS capsule Take 0.4 mg by mouth daily.     No current facility-administered medications on file prior to visit.     Objective:  Objective  Physical Exam Vitals and nursing note reviewed.  Constitutional:      General: He is not in acute  distress.    Appearance: He is well-developed and well-nourished.  Cardiovascular:     Rate and Rhythm: Normal rate and regular rhythm.     Heart sounds: Normal heart sounds.  Pulmonary:     Effort: Pulmonary effort is normal. No respiratory distress.     Breath sounds: Normal breath sounds.  Psychiatric:        Mood and Affect: Mood and affect normal.        Behavior: Behavior normal.        Thought Content: Thought content normal.        Judgment: Judgment normal.    BP 122/60 (BP Location: Left Arm, Patient Position: Sitting, Cuff Size: Large)   Pulse 76   Temp 98.4 F (36.9 C) (Oral)    Resp 18   Wt 299 lb (135.6 kg)   SpO2 96%   BMI 38.39 kg/m  Wt Readings from Last 3 Encounters:  06/10/20 299 lb (135.6 kg)  05/11/20 297 lb (134.7 kg)  03/19/20 298 lb 9.6 oz (135.4 kg)     Lab Results  Component Value Date   WBC 4.7 10/02/2019   HGB 13.2 10/02/2019   HCT 39.6 10/02/2019   PLT 244.0 10/02/2019   GLUCOSE 158 (H) 03/09/2020   CHOL 140 03/09/2020   TRIG 117.0 03/09/2020   HDL 32.90 (L) 03/09/2020   LDLDIRECT 73.0 12/04/2017   LDLCALC 84 03/09/2020   ALT 35 03/09/2020   AST 43 (H) 03/09/2020   NA 139 03/09/2020   K 4.8 03/09/2020   CL 104 03/09/2020   CREATININE 1.77 (H) 03/09/2020   BUN 40 (H) 03/09/2020   CO2 29 03/09/2020   TSH 2.42 03/09/2020   PSA 5.26 (H) 10/02/2019   HGBA1C 6.3 03/09/2020   MICROALBUR <0.7 10/02/2019    No results found.   Assessment & Plan:  Plan  I have discontinued Alex Rocha's methylPREDNISolone. I am also having him maintain his diclofenac sodium, Accu-Chek SmartView, BD Insulin Syringe U/F, fenofibrate, loratadine, insulin NPH Human, levothyroxine, tamsulosin, gentamicin cream, and losartan.  No orders of the defined types were placed in this encounter.   Problem List Items Addressed This Visit      Unprioritized   HTN (hypertension) - Primary    Well controlled, no changes to meds. Encouraged heart healthy diet such as the DASH diet and exercise as tolerated.       Relevant Orders   Lipid panel   Comprehensive metabolic panel   Microalbumin / creatinine urine ratio   Hyperlipidemia LDL goal <70    Tolerating statin, encouraged heart healthy diet, avoid trans fats, minimize simple carbs and saturated fats. Increase exercise as tolerated      Hypothyroidism following radioiodine therapy    Check labs  con't synthroid       Uncontrolled type 2 diabetes mellitus with hyperglycemia ([place])    Per endo       Other Visit Diagnoses    Hyperlipidemia associated with type 2 diabetes mellitus ([place])        Relevant Orders   Lipid panel   Comprehensive metabolic panel   Hypothyroidism, unspecified type       Relevant Orders   TSH      Follow-up: Return in about 6 months (around 12/08/2020), or if symptoms worsen or fail to improve, for fasting, annual exam.  Ann Held, DO

## 2020-06-10 NOTE — Assessment & Plan Note (Signed)
Check labs con't synthroid 

## 2020-06-10 NOTE — Patient Instructions (Signed)

## 2020-06-10 NOTE — Assessment & Plan Note (Signed)
Well controlled, no changes to meds. Encouraged heart healthy diet such as the DASH diet and exercise as tolerated.  °

## 2020-07-09 ENCOUNTER — Ambulatory Visit (INDEPENDENT_AMBULATORY_CARE_PROVIDER_SITE_OTHER): Payer: 59 | Admitting: Endocrinology

## 2020-07-09 ENCOUNTER — Other Ambulatory Visit: Payer: Self-pay

## 2020-07-09 ENCOUNTER — Encounter: Payer: 59 | Attending: Family Medicine | Admitting: Dietician

## 2020-07-09 ENCOUNTER — Encounter: Payer: Self-pay | Admitting: Dietician

## 2020-07-09 VITALS — BP 140/60 | HR 87 | Ht 73.0 in | Wt 295.8 lb

## 2020-07-09 DIAGNOSIS — E890 Postprocedural hypothyroidism: Secondary | ICD-10-CM | POA: Diagnosis not present

## 2020-07-09 DIAGNOSIS — E119 Type 2 diabetes mellitus without complications: Secondary | ICD-10-CM | POA: Insufficient documentation

## 2020-07-09 DIAGNOSIS — Z794 Long term (current) use of insulin: Secondary | ICD-10-CM | POA: Diagnosis not present

## 2020-07-09 LAB — POCT GLYCOSYLATED HEMOGLOBIN (HGB A1C): Hemoglobin A1C: 6.9 % — AB (ref 4.0–5.6)

## 2020-07-09 MED ORDER — INSULIN NPH (HUMAN) (ISOPHANE) 100 UNIT/ML ~~LOC~~ SUSP: 90.0000 [IU] | Freq: Two times a day (BID) | SUBCUTANEOUS | 3 refills | Status: DC

## 2020-07-09 NOTE — Progress Notes (Signed)
Subjective:    Patient ID: Alex Rocha, male    DOB: 1955/07/13, 65 y.o.   MRN: [id number]  HPI Pt returns for f/u of diabetes mellitus: DM type: Insulin-requiring type 2. Dx'ed: 2000.  Complications: PN and CRI.   Therapy: insulin since 2014.  DKA: never.  Severe hypoglycemia: once, in 2018 Pancreatitis: never.   SDOH: he says his need to care for his wife, who has cancer, limits his ability to care for himself;  he takes human insulin, due to cost.   Other: he declines multiple daily injections; he declines weight-loss surgery.   Interval history: no cbg record, but states cbg's vary from 60-190.  No recent steroids.  He seldom has hypoglycemia, and these episodes are mild.  This happens when he misses a meal.   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   Past Medical History:  Diagnosis Date  . Colon polyps   . Diabetes mellitus   . Environmental allergies   . Hyperlipidemia   . Hypertension   . Hyperthyroidism   . Kidney stones   . Low testosterone   . Prostate cancer [facility])     Past Surgical History:  Procedure Laterality Date  . PROSTATE BIOPSY    . VASECTOMY      Social History   Socioeconomic History  . Marital status: Married    Spouse name: Not on file  . Number of children: 2  . Years of education: Not on file  . Highest education level: Not on file  Occupational History  . Occupation: Nurse, adult  Tobacco Use  . Smoking status: Never Smoker  . Smokeless tobacco: Never Used  Vaping Use  . Vaping Use: Never used  Substance and Sexual Activity  . Alcohol use: Yes    Alcohol/week: 0.0 standard drinks    Comment: social  . Drug use: No  . Sexual activity: Yes  Other Topics Concern  . Not on file  Social History Narrative  . Not on file   Social Determinants of Health   Financial Resource Strain: Not on file   Food Insecurity: Not on file  Transportation Needs: Not on file  Physical Activity: Not on file  Stress: Not on file  Social Connections: Not on file  Intimate Partner Violence: Not on file    Current Outpatient Medications on File Prior to Visit  Medication Sig Dispense Refill  . BD INSULIN SYRINGE U/F 31G X 5/16" 1 ML MISC 1 EACH BY OTHER ROUTE 2 (TWO) TIMES DAILY. AS DIRECTED 180 each 3  . diclofenac sodium (VOLTAREN) 1 % GEL APPLY 4 G TOPICALLY 4 (FOUR) TIMES DAILY. 100 g 1  . fenofibrate 160 MG tablet TAKE 1 TABLET BY MOUTH EVERY DAY 30 tablet 5  . gentamicin cream (GARAMYCIN) 0.1 % Apply 1 application topically 2 (two) times daily. 30 g 1  . glucose blood (ACCU-CHEK SMARTVIEW) test strip 1 each by Other route 2 (two) times a day. 100 each 12  . levothyroxine (SYNTHROID) 112 MCG tablet TAKE 1 TABLET BY MOUTH EVERY DAY 30 tablet 2  . loratadine (CLARITIN) 10 MG tablet TAKE 1 TABLET BY MOUTH EVERY DAY 30 tablet 5  . losartan (COZAAR) 100 MG tablet Take 1 tablet (100 mg total) by mouth daily. 90 tablet 0  . tamsulosin (FLOMAX) 0.4 MG CAPS capsule Take 0.4 mg by mouth daily.  No current facility-administered medications on file prior to visit.    Allergies  Allergen Reactions  . [name] [Yohimbine] Palpitations    Family History  Problem Relation Age of Onset  . Colon cancer Mother   . Heart disease Mother   . Hypertension Mother        Mother's side of the family  . Diabetes Mother        Mother's side of the family  . Colon cancer Brother   . Prostate cancer Brother   . Hyperlipidemia Brother        Mother's side of the family  . Heart disease Sister   . Diabetes Other        father's family  . Cancer Paternal Aunt        unknown type  . Breast cancer Neg Hx   . Pancreatic cancer Neg Hx     BP 140/60 (BP Location: Right Arm, Patient Position: Sitting, Cuff Size: Large)   Pulse 87   Ht 6\' 1"  (1.854 m)   Wt 295 lb 12.8 oz (134.2 kg)   SpO2 95%   BMI 39.03  kg/m    Review of Systems He has lost weight--intentional.      Objective:   Physical Exam VITAL SIGNS:  See vs page GENERAL: no distress NECK: There is no palpable thyroid enlargement.  No thyroid nodule is palpable.  No palpable lymphadenopathy at the anterior neck.   Pulses: dorsalis pedis intact bilat.   MSK: no deformity of the feet CV: no leg edema Skin:  no ulcer on the feet.  normal color and temp on the feet. Neuro: sensation is intact to touch on the feet Ext: both great toenails are absent--recent resections.    Lab Results  Component Value Date   TSH 2.49 06/10/2020   T4TOTAL 6.0 04/24/2014       Assessment & Plan:  Insulin-requiring type 2 DM, with CRI: overcontrolled.    Patient Instructions  check your blood sugar twice a day.  vary the time of day when you check, between before the 3 meals, and at bedtime.  also check if you have symptoms of your blood sugar being too high or too low.  please keep a record of the readings and bring it to your next appointment here.  You can write it on any piece of paper.  please call us sooner if your blood sugar goes below 70, or if you have a lot of readings over 200.   I have sent a prescription to your pharmacy, to reduce the insulin to 90 units, twice a day.   On this type of insulin schedule, you should eat meals on a regular schedule (especially lunch).  If a meal is missed or significantly delayed, your blood sugar could go low.   Please come back for a follow-up appointment in 4 months.

## 2020-07-09 NOTE — Progress Notes (Signed)
Dexcom G6 Personal CGM Training  Start time:1500    End time: 8937 Total time: 35 Patient brought Dexcom that a family member could not use. He is using the receiver. Alex Rocha was educated about the following:  -Getting to know device    (Receiver:Phone programmed ) -Setting up device (high alert  250  , low alert 70  ) -Setting alert profile -Inserting sensor ( right upper arm  WNL) -Calibrating- none required for G6 -Ending sensor session -Trouble shooting -Tape guide, clarity information  -Reviewed insulin dosing from dexcom.   Patient has [facility] tech support and my contact information. Invitation for sharing and the Clarity information emailed to patient.  Darrol Jump, RD 07/09/2020 6:19 PM.

## 2020-07-09 NOTE — Patient Instructions (Addendum)
check your blood sugar twice a day.  vary the time of day when you check, between before the 3 meals, and at bedtime.  also check if you have symptoms of your blood sugar being too high or too low.  please keep a record of the readings and bring it to your next appointment here.  You can write it on any piece of paper.  please call us sooner if your blood sugar goes below 70, or if you have a lot of readings over 200.   I have sent a prescription to your pharmacy, to reduce the insulin to 90 units, twice a day.   On this type of insulin schedule, you should eat meals on a regular schedule (especially lunch).  If a meal is missed or significantly delayed, your blood sugar could go low.   Please come back for a follow-up appointment in 4 months.

## 2020-07-10 MED ORDER — DEXCOM G6 TRANSMITTER MISC: 1.0000 | Freq: Once | 1 refills | Status: AC

## 2020-07-10 MED ORDER — DEXCOM G6 SENSOR MISC: 1.0000 | 3 refills | Status: DC

## 2020-07-12 ENCOUNTER — Telehealth: Payer: Self-pay

## 2020-07-12 ENCOUNTER — Other Ambulatory Visit: Payer: Self-pay

## 2020-07-12 MED ORDER — "INSULIN SYRINGE-NEEDLE U-100 31G X 5/16"" 1 ML MISC": 3 refills | Status: DC

## 2020-07-12 NOTE — Telephone Encounter (Signed)
Patient is needing a Rx sent in he is about to run out as of today   BD INSULIN SYRINGE U/F 31G X 5/16" 1 ML MISC  [id number] [address], [place]

## 2020-07-12 NOTE — Telephone Encounter (Signed)
Rx sent 

## 2020-07-16 ENCOUNTER — Telehealth: Payer: Self-pay

## 2020-07-16 NOTE — Telephone Encounter (Signed)
PA has been approved for Dexcom transmitterand sensor from 07/14/2020-07/14/3021

## 2020-07-21 ENCOUNTER — Other Ambulatory Visit: Payer: Self-pay | Admitting: Family Medicine

## 2020-07-29 ENCOUNTER — Encounter: Payer: Self-pay | Admitting: Dietician

## 2020-07-29 ENCOUNTER — Encounter: Payer: 59 | Attending: Family Medicine | Admitting: Dietician

## 2020-07-29 ENCOUNTER — Other Ambulatory Visit: Payer: Self-pay

## 2020-07-29 DIAGNOSIS — E118 Type 2 diabetes mellitus with unspecified complications: Secondary | ICD-10-CM | POA: Diagnosis present

## 2020-07-29 NOTE — Progress Notes (Signed)
Diabetes Self-Management Education  Visit Type: First/Initial  Appt. Start Time: 1450 Appt. End Time: 7371  07/30/2020  [name], identified by name and date of birth, is a 65 y.o. male with a diagnosis of Diabetes: Type 2.   ASSESSMENT Patient is here today alone.  I saw him 07/09/2020 to place a Dexcom.  He has not shared his info.  New invite sent to his e-mail today. Patient states that he struggles finding a balance between a kidney diet and diabetic diet.  He is follow a diet low in potassium.  Avoids added salt.  History includes Type 2 Diabetes (2000), HTN, HLD, CKD Labs noted to include GFR 45, Cholesterol 192, HDL 30, LDL 107, Triglycerides 304 06/10/2020, A1C 6.9% 07/09/2020 increased form 6.3% 03/09/2020 Dexcom G6 CGM Medications include (NPH insulin (Humilin N) 90 units 1-2 times per day (patient bases this on his blood glucose. He has had low blood glucos a couple of times recently (60). Estimated protein needs 90-110 grams daily  Weight hx: 295 lbs 07/29/2020 317 lbs 04/2020 per patient  Patient lives with his wife.  He does most of the shopping and cooking.  His wife has had breast cancer. Patient works part time.  First shift. Patient walks most days for 30-45 minutes.  He states that he is going to resume golfing and start back on the weights.     Height 6\' 2"  (1.88 m), weight 295 lb (133.8 kg). Body mass index is 37.88 kg/m.   Diabetes Self-Management Education - 07/29/20 1520      Visit Information   Visit Type First/Initial      Initial Visit   Diabetes Type Type 2    Are you currently following a meal plan? No    Are you taking your medications as prescribed? Yes    Date Diagnosed 2000      Health Coping   How would you rate your overall health? Good      Psychosocial Assessment   Patient Belief/Attitude about Diabetes Motivated to manage diabetes    Self-care barriers None    Self-management support Doctor's office    Other persons  present Patient    Patient Concerns Nutrition/Meal planning    Special Needs None    Preferred Learning Style No preference indicated    Learning Readiness Ready    How often do you need to have someone help you when you read instructions, pamphlets, or other written materials from your doctor or pharmacy? 1 - Never    What is the last grade level you completed in school? 1 year grad school      Pre-Education Assessment   Patient understands the diabetes disease and treatment process. Needs Instruction    Patient understands incorporating nutritional management into lifestyle. Needs Instruction    Patient undertands incorporating physical activity into lifestyle. Needs Instruction    Patient understands using medications safely. Needs Instruction    Patient understands monitoring blood glucose, interpreting and using results Needs Instruction    Patient understands prevention, detection, and treatment of acute complications. Needs Instruction    Patient understands prevention, detection, and treatment of chronic complications. Needs Instruction    Patient understands how to develop strategies to address psychosocial issues. Needs Instruction    Patient understands how to develop strategies to promote health/change behavior. Needs Instruction      Complications   Last HgB A1C per patient/outside source 6.9 %   07/09/2020 increased from 6.3% 03/09/2020   How often  do you check your blood sugar? > 4 times/day    Fasting Blood glucose range (mg/dL) <70;70-129    Postprandial Blood glucose range (mg/dL) 180-200;130-179    Number of hypoglycemic episodes per month 2    Can you tell when your blood sugar is low? Yes    What do you do if your blood sugar is low? eats dried fruit    Number of hyperglycemic episodes per week 0    Have you had a dilated eye exam in the past 12 months? Yes    Have you had a dental exam in the past 12 months? Yes    Are you checking your feet? Yes    How many days  per week are you checking your feet? 7      Dietary Intake   Breakfast apple, pear    Lunch 4 boiled egg whites, apple, carrots, handful of tortilla chips OR salad, occasional dried cranberries    Snack (afternoon) none    Dinner baked fish or skinlless chicken, cabbage or salad or slaw OR cabbage soup    Snack (evening) none    Beverage(s) water, sugar free cranberry juice, occasional sprite zero      Exercise   Exercise Type Light (walking / raking leaves)    How many days per week to you exercise? 5    How many minutes per day do you exercise? 30    Total minutes per week of exercise 150      Patient Education   Previous Diabetes Education Yes (please comment)   CKD nutrition appointment 2021   Disease state  Definition of diabetes, type 1 and 2, and the diagnosis of diabetes    Nutrition management  Role of diet in the treatment of diabetes and the relationship between the three main macronutrients and blood glucose level;Meal options for control of blood glucose level and chronic complications.;Other (comment)   diet for CKD, potassium guidelines   Physical activity and exercise  Role of exercise on diabetes management, blood pressure control and cardiac health.    Medications Reviewed patients medication for diabetes, action, purpose, timing of dose and side effects.    Monitoring Taught/discussed recording of test results and interpretation of SMBG.;Identified appropriate SMBG and/or A1C goals.;Daily foot exams;Yearly dilated eye exam    Acute complications Taught treatment of hypoglycemia - the 15 rule.;Discussed and identified patients' treatment of hyperglycemia.      Individualized Goals (developed by patient)   Nutrition General guidelines for healthy choices and portions discussed    Physical Activity Exercise 5-7 days per week;30 minutes per day    Medications take my medication as prescribed    Reducing Risk do foot checks daily;examine blood glucose patterns;increase  portions of healthy fats;Other (comment)   low potassium fruits and vegetables     Post-Education Assessment   Patient understands the diabetes disease and treatment process. Demonstrates understanding / competency    Patient understands incorporating nutritional management into lifestyle. Needs Review    Patient undertands incorporating physical activity into lifestyle. Demonstrates understanding / competency    Patient understands using medications safely. Demonstrates understanding / competency    Patient understands monitoring blood glucose, interpreting and using results Demonstrates understanding / competency    Patient understands prevention, detection, and treatment of acute complications. Demonstrates understanding / competency    Patient understands prevention, detection, and treatment of chronic complications. Demonstrates understanding / competency    Patient understands how to develop strategies to address psychosocial issues. Demonstrates  understanding / competency    Patient understands how to develop strategies to promote health/change behavior. Needs Review      Outcomes   Expected Outcomes Demonstrated interest in learning. Expect positive outcomes    Future DMSE 2 months    Program Status Not Completed           Individualized Plan for Diabetes Self-Management Training:   Learning Objective:  Patient will have a greater understanding of diabetes self-management. Patient education plan is to attend individual and/or group sessions per assessed needs and concerns.   Plan:   Patient Instructions  Balanced meals Avoid foods that have phos... and potassium as preservatives.  After treating a low blood sugar.  Be sure your blood sugar returns to normal, then have a snack with protein and carbohydrates.  Choose mostly low potassium fruits and vegetables  Blood glucose goals:    80-130 fasting  Less than 180 after a meal   (generally a rise of no more than 40-60  points with exercise)   Expected Outcomes:  Demonstrated interest in learning. Expect positive outcomes  Education material provided: ADA - How to Thrive: A Guide for Your Journey with Diabetes,  Types of Insulin, NKD diet National Kidney diet placemat  If problems or questions, patient to contact team via:  Phone  Future DSME appointment: 2 months

## 2020-07-29 NOTE — Patient Instructions (Signed)
Balanced meals Avoid foods that have phos... and potassium as preservatives.  After treating a low blood sugar.  Be sure your blood sugar returns to normal, then have a snack with protein and carbohydrates.  Choose mostly low potassium fruits and vegetables  Blood glucose goals:    80-130 fasting  Less than 180 after a meal   (generally a rise of no more than 40-60 points with exercise)

## 2020-08-24 ENCOUNTER — Other Ambulatory Visit: Payer: Self-pay | Admitting: Family Medicine

## 2020-08-24 DIAGNOSIS — J069 Acute upper respiratory infection, unspecified: Secondary | ICD-10-CM

## 2020-09-14 ENCOUNTER — Other Ambulatory Visit (INDEPENDENT_AMBULATORY_CARE_PROVIDER_SITE_OTHER): Payer: 59

## 2020-09-14 ENCOUNTER — Other Ambulatory Visit: Payer: Self-pay

## 2020-09-14 DIAGNOSIS — I10 Essential (primary) hypertension: Secondary | ICD-10-CM | POA: Diagnosis not present

## 2020-09-14 DIAGNOSIS — E1165 Type 2 diabetes mellitus with hyperglycemia: Secondary | ICD-10-CM

## 2020-09-14 LAB — COMPREHENSIVE METABOLIC PANEL
ALT: 41 U/L (ref 0–53)
AST: 38 U/L — ABNORMAL HIGH (ref 0–37)
Albumin: 4.4 g/dL (ref 3.5–5.2)
Alkaline Phosphatase: 36 U/L — ABNORMAL LOW (ref 39–117)
BUN: 30 mg/dL — ABNORMAL HIGH (ref 6–23)
CO2: 22 mEq/L (ref 19–32)
Calcium: 9.4 mg/dL (ref 8.4–10.5)
Chloride: 108 mEq/L (ref 96–112)
Creatinine, Ser: 1.63 mg/dL — ABNORMAL HIGH (ref 0.40–1.50)
GFR: 44.83 mL/min — ABNORMAL LOW (ref >60.00)
Glucose, Bld: 107 mg/dL — ABNORMAL HIGH (ref 70–99)
Potassium: 4.6 mEq/L (ref 3.5–5.1)
Sodium: 139 mEq/L (ref 135–145)
Total Bilirubin: 0.3 mg/dL (ref 0.2–1.2)
Total Protein: 6.9 g/dL (ref 6.0–8.3)

## 2020-09-14 LAB — LIPID PANEL
Cholesterol: 142 mg/dL (ref 0–200)
HDL: 29.8 mg/dL — ABNORMAL LOW (ref >39.00)
LDL Cholesterol: 73 mg/dL (ref 0–99)
NonHDL: 111.99
Total CHOL/HDL Ratio: 5
Triglycerides: 197 mg/dL — ABNORMAL HIGH (ref 0.0–149.0)
VLDL: 39.4 mg/dL (ref 0.0–40.0)

## 2020-09-14 LAB — HEMOGLOBIN A1C: Hgb A1c MFr Bld: 7.1 % — ABNORMAL HIGH (ref 4.6–6.5)

## 2020-09-14 LAB — MICROALBUMIN / CREATININE URINE RATIO
Creatinine,U: 145.2 mg/dL
Microalb Creat Ratio: 0.9 mg/g (ref 0.0–30.0)
Microalb, Ur: 1.3 mg/dL (ref 0.0–1.9)

## 2020-09-17 ENCOUNTER — Other Ambulatory Visit: Payer: Self-pay | Admitting: Family Medicine

## 2020-09-26 ENCOUNTER — Other Ambulatory Visit: Payer: Self-pay | Admitting: Family Medicine

## 2020-10-11 ENCOUNTER — Other Ambulatory Visit: Payer: Self-pay | Admitting: Family Medicine

## 2020-10-12 ENCOUNTER — Encounter: Payer: Self-pay | Admitting: Family Medicine

## 2020-10-19 ENCOUNTER — Other Ambulatory Visit: Payer: Self-pay

## 2020-10-19 ENCOUNTER — Ambulatory Visit: Payer: 59 | Admitting: Family Medicine

## 2020-10-19 ENCOUNTER — Encounter: Payer: Self-pay | Admitting: Family Medicine

## 2020-10-19 VITALS — BP 118/68 | HR 68 | Temp 98.5°F | Resp 18 | Ht 74.0 in | Wt 282.0 lb

## 2020-10-19 DIAGNOSIS — D649 Anemia, unspecified: Secondary | ICD-10-CM | POA: Diagnosis not present

## 2020-10-19 NOTE — Progress Notes (Signed)
Established Patient Office Visit  Subjective:  Patient ID: Alex Rocha, male    DOB: 06-Jul-1955  Age: 65 y.o. MRN: [id number]  CC:  Chief Complaint  Patient presents with   Anemia    Pt wanting to discuss labs done at different office.    Follow-up    HPI Alex Rocha presents for f/u from nephrology----- they told him he was anemic and was bleeding internally.  Pt denies blood in stool.   No dyspepsia   he does c/o fatigue   Past Medical History:  Diagnosis Date   Colon polyps    Diabetes mellitus    Environmental allergies    Hyperlipidemia    Hypertension    Hyperthyroidism    Kidney stones    Low testosterone    Prostate cancer ([place])     Past Surgical History:  Procedure Laterality Date   PROSTATE BIOPSY     VASECTOMY      Family History  Problem Relation Age of Onset   Colon cancer Mother    Heart disease Mother    Hypertension Mother        Mother's side of the family   Diabetes Mother        Mother's side of the family   Colon cancer Brother    Prostate cancer Brother    Hyperlipidemia Brother        Mother's side of the family   Heart disease Sister    Diabetes Other        father's family   Cancer Paternal Aunt        unknown type   Breast cancer Neg Hx    Pancreatic cancer Neg Hx     Social History   Socioeconomic History   Marital status: Married    Spouse name: Not on file   Number of children: 2   Years of education: Not on file   Highest education level: Not on file  Occupational History   Occupation: Nurse, adult  Tobacco Use   Smoking status: Never   Smokeless tobacco: Never  Vaping Use   Vaping Use: Never used  Substance and Sexual Activity   Alcohol use: Yes    Alcohol/week: 0.0 standard drinks    Comment: social   Drug use: No   Sexual activity: Yes  Other Topics Concern   Not on file  Social History Narrative   Not on file   Social Determinants of Health   Financial Resource Strain: Not on  file  Food Insecurity: Not on file  Transportation Needs: Not on file  Physical Activity: Not on file  Stress: Not on file  Social Connections: Not on file  Intimate Partner Violence: Not on file    Outpatient Medications Prior to Visit  Medication Sig Dispense Refill   Cinnamon 500 MG capsule Take 500 mg by mouth daily.     co-enzyme Q-10 30 MG capsule Take 30 mg by mouth 3 (three) times daily.     Continuous Blood Gluc Sensor (DEXCOM G6 SENSOR) MISC 1 each by Does not apply route See admin instructions. Change every 10 days 9 each 3   diclofenac sodium (VOLTAREN) 1 % GEL APPLY 4 G TOPICALLY 4 (FOUR) TIMES DAILY. 100 g 1   fenofibrate 160 MG tablet TAKE 1 TABLET BY MOUTH EVERY DAY 30 tablet 2   gentamicin cream (GARAMYCIN) 0.1 % Apply 1 application topically 2 (two) times daily. 30 g 1   glucose blood (ACCU-CHEK SMARTVIEW) test  strip 1 each by Other route 2 (two) times a day. 100 each 12   insulin NPH Human (HUMULIN N) 100 UNIT/ML injection Inject 0.9 mLs (90 Units total) into the skin 2 (two) times daily. 180 mL 3   Insulin Syringe-Needle U-100 (BD INSULIN SYRINGE U/F) 31G X 5/16" 1 ML MISC 1 EACH BY OTHER ROUTE 2 (TWO) TIMES DAILY. AS DIRECTED 180 each 3   levothyroxine (SYNTHROID) 112 MCG tablet TAKE 1 TABLET BY MOUTH EVERY DAY 30 tablet 2   loratadine (CLARITIN) 10 MG tablet TAKE 1 TABLET BY MOUTH EVERY DAY 30 tablet 4   losartan (COZAAR) 100 MG tablet TAKE 1 TABLET BY MOUTH EVERY DAY 30 tablet 2   Omega 3-6-9 Fatty Acids (OMEGA 3-6-9 COMPLEX PO) Take by mouth.     tamsulosin (FLOMAX) 0.4 MG CAPS capsule Take 0.4 mg by mouth daily.     Turmeric 1053 MG TABS Take by mouth.     vitamin B-6 (PYRIDOXINE) 25 MG tablet Take 25 mg by mouth daily.     No facility-administered medications prior to visit.    Allergies  Allergen Reactions   [name] [Yohimbine] Palpitations    ROS Review of Systems  Constitutional:  Positive for fatigue. Negative for appetite change, diaphoresis and  unexpected weight change.  Eyes:  Negative for pain, redness and visual disturbance.  Respiratory:  Negative for cough, chest tightness, shortness of breath and wheezing.   Cardiovascular:  Negative for chest pain, palpitations and leg swelling.  Gastrointestinal:  Negative for abdominal pain, anal bleeding, blood in stool and nausea.  Endocrine: Negative for cold intolerance, heat intolerance, polydipsia, polyphagia and polyuria.  Genitourinary:  Negative for difficulty urinating, dysuria and frequency.  Neurological:  Negative for dizziness, light-headedness, numbness and headaches.     Objective:    Physical Exam Vitals and nursing note reviewed.  Constitutional:      Appearance: He is well-developed.  HENT:     Head: Normocephalic and atraumatic.  Eyes:     Pupils: Pupils are equal, round, and reactive to light.  Neck:     Thyroid: No thyromegaly.  Cardiovascular:     Rate and Rhythm: Normal rate and regular rhythm.     Heart sounds: No murmur heard. Pulmonary:     Effort: Pulmonary effort is normal. No respiratory distress.     Breath sounds: Normal breath sounds. No wheezing or rales.  Chest:     Chest wall: No tenderness.  Abdominal:     General: There is no distension.     Tenderness: There is no abdominal tenderness. There is no guarding or rebound.  Genitourinary:    Prostate: Not enlarged, not tender and no nodules present.     Rectum: Guaiac result negative. No tenderness or external hemorrhoid.  Musculoskeletal:        General: No tenderness.     Cervical back: Normal range of motion and neck supple.  Skin:    General: Skin is warm and dry.  Neurological:     Mental Status: He is alert and oriented to person, place, and time.  Psychiatric:        Behavior: Behavior normal.        Thought Content: Thought content normal.        Judgment: Judgment normal.    BP 118/68 (BP Location: Right Arm, Patient Position: Sitting, Cuff Size: Large)   Pulse 68   Temp  98.5 F (36.9 C) (Oral)   Resp 18   Ht 6\' 2"  (  1.88 m)   Wt 282 lb (127.9 kg)   SpO2 96%   BMI 36.21 kg/m  Wt Readings from Last 3 Encounters:  10/19/20 282 lb (127.9 kg)  07/29/20 295 lb (133.8 kg)  07/09/20 295 lb 12.8 oz (134.2 kg)     Health Maintenance Due  Topic Date Due   Pneumococcal Vaccine 29-34 Years old (1 - PCV) Never done   HIV Screening  Never done   COVID-19 Vaccine (4 - Booster for Moderna series) 05/19/2020    There are no preventive care reminders to display for this patient.  Lab Results  Component Value Date   TSH 2.49 06/10/2020   Lab Results  Component Value Date   WBC 4.7 10/02/2019   HGB 13.2 10/02/2019   HCT 39.6 10/02/2019   MCV 95.6 10/02/2019   PLT 244.0 10/02/2019   Lab Results  Component Value Date   NA 139 09/14/2020   K 4.6 09/14/2020   CO2 22 09/14/2020   GLUCOSE 107 (H) 09/14/2020   BUN 30 (H) 09/14/2020   CREATININE 1.63 (H) 09/14/2020   BILITOT 0.3 09/14/2020   ALKPHOS 36 (L) 09/14/2020   AST 38 (H) 09/14/2020   ALT 41 09/14/2020   PROT 6.9 09/14/2020   ALBUMIN 4.4 09/14/2020   CALCIUM 9.4 09/14/2020   ANIONGAP 8 08/01/2019   GFR 44.83 (L) 09/14/2020   Lab Results  Component Value Date   CHOL 142 09/14/2020   Lab Results  Component Value Date   HDL 29.80 (L) 09/14/2020   Lab Results  Component Value Date   LDLCALC 73 09/14/2020   Lab Results  Component Value Date   TRIG 197.0 (H) 09/14/2020   Lab Results  Component Value Date   CHOLHDL 5 09/14/2020   Lab Results  Component Value Date   HGBA1C 7.1 (H) 09/14/2020      Assessment & Plan:   Problem List Items Addressed This Visit   None Visit Diagnoses     Anemia, unspecified type    -  Primary   Relevant Orders   CBC with Differential/Platelet   IBC + Ferritin       No orders of the defined types were placed in this encounter.   Follow-up: Return if symptoms worsen or fail to improve.    Ann Held, DO

## 2020-10-19 NOTE — Assessment & Plan Note (Signed)
Recheck labs  con't hematology guaic neg

## 2020-10-19 NOTE — Patient Instructions (Signed)
[employer] medicine (25th ed., pp. 1059-1068). [place], [place]: Elsevier.">  Anemia  Anemia is a condition in which there is not enough red blood cells or hemoglobin in the blood. Hemoglobin is a substance in red blood cells thatcarries oxygen. When you do not have enough red blood cells or hemoglobin (are anemic), your body cannot get enough oxygen and your organs may not work properly. Asa result, you may feel very tired or have other problems. What are the causes? Common causes of anemia include: Excessive bleeding. Anemia can be caused by excessive bleeding inside or outside the body, including bleeding from the intestines or from heavy menstrual periods in females. Poor nutrition. Long-lasting (chronic) kidney, thyroid, and liver disease. Bone marrow disorders, spleen problems, and blood disorders. Cancer and treatments for cancer. HIV (human immunodeficiency virus) and AIDS (acquired immunodeficiency syndrome). Infections, medicines, and autoimmune disorders that destroy red blood cells. What are the signs or symptoms? Symptoms of this condition include: Minor weakness. Dizziness. Headache, or difficulties concentrating and sleeping. Heartbeats that feel irregular or faster than normal (palpitations). Shortness of breath, especially with exercise. Pale skin, lips, and nails, or cold hands and feet. Indigestion and nausea. Symptoms may occur suddenly or develop slowly. If your anemia is mild, you maynot have symptoms. How is this diagnosed? This condition is diagnosed based on blood tests, your medical history, and a physical exam. In some cases, a test may be needed in which cells are removed from the soft tissue inside of a bone and looked at under a microscope (bone marrow biopsy). Your health care provider may also check your stool (feces) for blood and may do additional testing to look for the cause of yourbleeding. Other tests may include: Imaging tests, such as a CT scan or  MRI. A procedure to see inside your esophagus and stomach (endoscopy). A procedure to see inside your colon and rectum (colonoscopy). How is this treated? Treatment for this condition depends on the cause. If you continue to lose a lot of blood, you may need to be treated at a hospital. Treatment may include: Taking supplements of iron, vitamin B12, or folic acid. Taking a hormone medicine (erythropoietin) that can help to stimulate red blood cell growth. Having a blood transfusion. This may be needed if you lose a lot of blood. Making changes to your diet. Having surgery to remove your spleen. Follow these instructions at home: Take over-the-counter and prescription medicines only as told by your health care provider. Take supplements only as told by your health care provider. Follow any diet instructions that you were given by your health care provider. Keep all follow-up visits as told by your health care provider. This is important. Contact a health care provider if: You develop new bleeding anywhere in the body. Get help right away if: You are very weak. You are short of breath. You have pain in your abdomen or chest. You are dizzy or feel faint. You have trouble concentrating. You have bloody stools, black stools, or tarry stools. You vomit repeatedly or you vomit up blood. These symptoms may represent a serious problem that is an emergency. Do not wait to see if the symptoms will go away. Get medical help right away. Call your local emergency services (911 in the [place] Do not drive yourself to the hospital. Summary Anemia is a condition in which you do not have enough red blood cells or enough of a substance in your red blood cells that carries oxygen (hemoglobin). Symptoms may occur suddenly   or develop slowly. If your anemia is mild, you may not have symptoms. This condition is diagnosed with blood tests, a medical history, and a physical exam. Other tests may be  needed. Treatment for this condition depends on the cause of the anemia. This information is not intended to replace advice given to you by your health care provider. Make sure you discuss any questions you have with your healthcare provider. Document Revised: 03/11/2019 Document Reviewed: 03/11/2019 Elsevier Patient Education  2022 [employer].

## 2020-10-20 ENCOUNTER — Encounter: Payer: Self-pay | Admitting: Endocrinology

## 2020-10-20 LAB — CBC WITH DIFFERENTIAL/PLATELET
Basophils Absolute: 0.1 10*3/uL (ref 0.0–0.1)
Basophils Relative: 1.3 % (ref 0.0–3.0)
Eosinophils Absolute: 0.1 10*3/uL (ref 0.0–0.7)
Eosinophils Relative: 1.6 % (ref 0.0–5.0)
HCT: 31.5 % — ABNORMAL LOW (ref 39.0–52.0)
Hemoglobin: 10.8 g/dL — ABNORMAL LOW (ref 13.0–17.0)
Lymphocytes Relative: 30.8 % (ref 12.0–46.0)
Lymphs Abs: 1.9 10*3/uL (ref 0.7–4.0)
MCHC: 34.3 g/dL (ref 30.0–36.0)
MCV: 94.9 fl (ref 78.0–100.0)
Monocytes Absolute: 0.5 10*3/uL (ref 0.1–1.0)
Monocytes Relative: 8.8 % (ref 3.0–12.0)
Neutro Abs: 3.5 10*3/uL (ref 1.4–7.7)
Neutrophils Relative %: 57.5 % (ref 43.0–77.0)
Platelets: 295 10*3/uL (ref 150.0–400.0)
RBC: 3.32 Mil/uL — ABNORMAL LOW (ref 4.22–5.81)
RDW: 13 % (ref 11.5–15.5)
WBC: 6.2 10*3/uL (ref 4.0–10.5)

## 2020-10-20 LAB — IBC + FERRITIN
Ferritin: 377.5 ng/mL — ABNORMAL HIGH (ref 22.0–322.0)
Iron: 93 ug/dL (ref 42–165)
Saturation Ratios: 22.9 % (ref 20.0–50.0)
Transferrin: 290 mg/dL (ref 212.0–360.0)

## 2020-10-21 ENCOUNTER — Encounter: Payer: Self-pay | Admitting: Family Medicine

## 2020-10-25 ENCOUNTER — Other Ambulatory Visit: Payer: Self-pay

## 2020-10-25 DIAGNOSIS — D649 Anemia, unspecified: Secondary | ICD-10-CM

## 2020-11-03 ENCOUNTER — Encounter: Payer: Self-pay | Admitting: Nurse Practitioner

## 2020-11-12 ENCOUNTER — Ambulatory Visit: Payer: 59 | Admitting: Endocrinology

## 2020-11-29 ENCOUNTER — Encounter: Payer: Self-pay | Admitting: Family Medicine

## 2020-11-29 ENCOUNTER — Other Ambulatory Visit: Payer: Self-pay

## 2020-11-29 ENCOUNTER — Ambulatory Visit: Payer: 59 | Admitting: Endocrinology

## 2020-11-29 VITALS — BP 110/40 | HR 77 | Ht 74.0 in | Wt 286.4 lb

## 2020-11-29 DIAGNOSIS — Z794 Long term (current) use of insulin: Secondary | ICD-10-CM

## 2020-11-29 DIAGNOSIS — E119 Type 2 diabetes mellitus without complications: Secondary | ICD-10-CM | POA: Diagnosis not present

## 2020-11-29 DIAGNOSIS — E890 Postprocedural hypothyroidism: Secondary | ICD-10-CM

## 2020-11-29 LAB — POCT GLYCOSYLATED HEMOGLOBIN (HGB A1C): Hemoglobin A1C: 5.7 % — AB (ref 4.0–5.6)

## 2020-11-29 MED ORDER — INSULIN NPH (HUMAN) (ISOPHANE) 100 UNIT/ML ~~LOC~~ SUSP
85.0000 [IU] | Freq: Two times a day (BID) | SUBCUTANEOUS | 3 refills | Status: DC
Start: 2020-11-29 — End: 2021-03-31

## 2020-11-29 NOTE — Patient Instructions (Addendum)
check your blood sugar twice a day.  vary the time of day when you check, between before the 3 meals, and at bedtime.  also check if you have symptoms of your blood sugar being too high or too low.  please keep a record of the readings and bring it to your next appointment here.  You can write it on any piece of paper.  please call us sooner if your blood sugar goes below 70, or if you have a lot of readings over 200.   I have sent a prescription to your pharmacy, to reduce the insulin to 85 units, twice a day.   On this type of insulin schedule, you should eat meals on a regular schedule (especially lunch).  If a meal is missed or significantly delayed, your blood sugar could go low.   Please come back for a follow-up appointment in 4 months.

## 2020-11-29 NOTE — Progress Notes (Signed)
Subjective:    Patient ID: Alex Rocha, male    DOB: Sep 23, 1955, 65 y.o.   MRN: [id number]  HPI Pt returns for f/u of diabetes mellitus:  DM type: Insulin-requiring type 2. Dx'ed: 2000.  Complications: PN and stage 3 CRI.   Therapy: insulin since 2014.  DKA: never.  Severe hypoglycemia: once, in 2018.   Pancreatitis: never.   SDOH: he says his need to care for his wife, who has cancer, limits his ability to care for himself;  he takes human insulin, due to cost; he works at retirement home.   Other: he declines multiple daily injections; he declines weight-loss surgery.   Interval history: no cbg record, but states cbg's vary from 65-170.  There is no trend throughout the day.  No recent steroids.  He seldom has hypoglycemia, and these episodes are mild.   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   Past Medical History:  Diagnosis Date   Colon polyps    Diabetes mellitus    Environmental allergies    Hyperlipidemia    Hypertension    Hyperthyroidism    Kidney stones    Low testosterone    Prostate cancer [facility])     Past Surgical History:  Procedure Laterality Date   PROSTATE BIOPSY     VASECTOMY      Social History   Socioeconomic History   Marital status: Married    Spouse name: Not on file   Number of children: 2   Years of education: Not on file   Highest education level: Not on file  Occupational History   Occupation: Nurse, adult  Tobacco Use   Smoking status: Never   Smokeless tobacco: Never  Vaping Use   Vaping Use: Never used  Substance and Sexual Activity   Alcohol use: Yes    Alcohol/week: 0.0 standard drinks    Comment: social   Drug use: No   Sexual activity: Yes  Other Topics Concern   Not on file  Social History Narrative   Not on file   Social Determinants of Health   Financial Resource Strain: Not on  file  Food Insecurity: Not on file  Transportation Needs: Not on file  Physical Activity: Not on file  Stress: Not on file  Social Connections: Not on file  Intimate Partner Violence: Not on file    Current Outpatient Medications on File Prior to Visit  Medication Sig Dispense Refill   Cinnamon 500 MG capsule Take 500 mg by mouth daily.     co-enzyme Q-10 30 MG capsule Take 30 mg by mouth 3 (three) times daily.     Continuous Blood Gluc Sensor (DEXCOM G6 SENSOR) MISC 1 each by Does not apply route See admin instructions. Change every 10 days 9 each 3   diclofenac sodium (VOLTAREN) 1 % GEL APPLY 4 G TOPICALLY 4 (FOUR) TIMES DAILY. 100 g 1   fenofibrate 160 MG tablet TAKE 1 TABLET BY MOUTH EVERY DAY 30 tablet 2   gentamicin cream (GARAMYCIN) 0.1 % Apply 1 application topically 2 (two) times daily. 30 g 1   glucose blood (ACCU-CHEK SMARTVIEW) test strip 1 each by Other route 2 (two) times a day. 100 each 12   Insulin Syringe-Needle U-100 (BD INSULIN SYRINGE U/F) 31G X 5/16" 1 ML MISC 1 EACH BY OTHER ROUTE 2 (TWO) TIMES DAILY. AS DIRECTED  180 each 3   levothyroxine (SYNTHROID) 112 MCG tablet TAKE 1 TABLET BY MOUTH EVERY DAY 30 tablet 2   loratadine (CLARITIN) 10 MG tablet TAKE 1 TABLET BY MOUTH EVERY DAY 30 tablet 4   losartan (COZAAR) 100 MG tablet TAKE 1 TABLET BY MOUTH EVERY DAY 30 tablet 2   Omega 3-6-9 Fatty Acids (OMEGA 3-6-9 COMPLEX PO) Take by mouth.     tamsulosin (FLOMAX) 0.4 MG CAPS capsule Take 0.4 mg by mouth daily.     Turmeric 1053 MG TABS Take by mouth.     vitamin B-6 (PYRIDOXINE) 25 MG tablet Take 25 mg by mouth daily.     No current facility-administered medications on file prior to visit.    Allergies  Allergen Reactions   [name] [Yohimbine] Palpitations    Family History  Problem Relation Age of Onset   Colon cancer Mother    Heart disease Mother    Hypertension Mother        Mother's side of the family   Diabetes Mother        Mother's side of the family    Colon cancer Brother    Prostate cancer Brother    Hyperlipidemia Brother        Mother's side of the family   Heart disease Sister    Diabetes Other        father's family   Cancer Paternal Aunt        unknown type   Breast cancer Neg Hx    Pancreatic cancer Neg Hx     BP (!) 110/40 (BP Location: Right Arm, Patient Position: Sitting, Cuff Size: Large)   Pulse 77   Ht 6\' 2"  (1.88 m)   Wt 286 lb 6.4 oz (129.9 kg)   SpO2 96%   BMI 36.77 kg/m    Review of Systems     Objective:   Physical Exam Pulses: dorsalis pedis intact bilat.   MSK: no deformity of the feet CV: no leg edema Skin:  no ulcer on the feet.  normal color and temp on the feet. Neuro: sensation is intact to touch on the feet  Lab Results  Component Value Date   CREATININE 1.63 (H) 09/14/2020   BUN 30 (H) 09/14/2020   NA 139 09/14/2020   K 4.6 09/14/2020   CL 108 09/14/2020   CO2 22 09/14/2020     Lab Results  Component Value Date   TSH 2.49 06/10/2020   T4TOTAL 6.0 04/24/2014   A1c=5.7%    Assessment & Plan:  Type 2 DM: overcontrolled Hypoglycemia, due to insulin.    Patient Instructions  check your blood sugar twice a day.  vary the time of day when you check, between before the 3 meals, and at bedtime.  also check if you have symptoms of your blood sugar being too high or too low.  please keep a record of the readings and bring it to your next appointment here.  You can write it on any piece of paper.  please call us sooner if your blood sugar goes below 70, or if you have a lot of readings over 200.   I have sent a prescription to your pharmacy, to reduce the insulin to 85 units, twice a day.   On this type of insulin schedule, you should eat meals on a regular schedule (especially lunch).  If a meal is missed or significantly delayed, your blood sugar could go low.   Please come back for a follow-up  appointment in 4 months.

## 2020-12-02 ENCOUNTER — Encounter: Payer: Self-pay | Admitting: Endocrinology

## 2020-12-03 ENCOUNTER — Other Ambulatory Visit: Payer: Self-pay

## 2020-12-03 DIAGNOSIS — E119 Type 2 diabetes mellitus without complications: Secondary | ICD-10-CM

## 2020-12-03 MED ORDER — DEXCOM G6 TRANSMITTER MISC: 3 refills | Status: DC

## 2020-12-05 NOTE — Progress Notes (Signed)
12/06/2020 BENFORD ASCH [id number] Aug 29, 1955   CHIEF COMPLAINT: Anemia   HISTORY OF PRESENT ILLNESS:  Alex Rocha is a 65 year old male with a past medical history of hypertension, hyperlipidemia, hyperthyroidism, DM II, kidney stones, CKD stage III, sleep apnea uses CPAP, hypothyroidism, prostate cancer under surveillance, H. Pylori 04/2014 and colon polyps.   He was initially seen in our office by Dr Ardis Hughs in 2016 secondary to epigastric pain in the setting of + NSAID use and positive H. Pylori IgG antibody treated with Prevpak per his PCP.  His epigastric pain and dyspepsia were resolved at the time of this consult therefore he was advised to eventually taper off Omeprazole and to avoid NSAIDs.  He denies ever having an EGD.  He presents to our office today as referred by Dr. Carollee Herter for further evaluation regarding anemia.  Laboratory studies done 10/19/2020 showed a Hemoglobin level of 10.8 ( Hg 13.2 on 10/02/2019).  Normal iron level 93.  Ferritin 337.5. He denies having any dysphagia or heartburn.  No upper or lower abdominal pain.  He is passing a normal formed brown bowel movement daily.  No rectal bleeding or black stools.  He underwent a colonoscopy in 2012 which he stated was normal and was possibly was done by Dr. Benson Norway. His mother was diagnosed with colon cancer in her late 19's and his brother was diagnosed with colon cancer in late 6's to early 76's.  No fever, sweats or chills.  No weight loss.  No complaints today.   Labs 10/19/2020: Iron 93. Ferritin 377.5.  CBC Latest Ref Rng & Units 12/06/2020 10/19/2020 10/02/2019  WBC 4.0 - 10.5 K/uL 4.8 6.2 4.7  Hemoglobin 13.0 - 17.0 g/dL 10.7(L) 10.8(L) 13.2  Hematocrit 39.0 - 52.0 % 32.0(L) 31.5(L) 39.6  Platelets 150.0 - 400.0 K/uL 242.0 295.0 244.0  MCV 94.9.   CMP Latest Ref Rng & Units 09/14/2020 06/10/2020 03/09/2020  Glucose 70 - 99 mg/dL 107(H) 212(H) 158(H)  BUN 6 - 23 mg/dL 30(H) 33(H) 40(H)  Creatinine 0.40 -  1.50 mg/dL 1.63(H) 1.61(H) 1.77(H)  Sodium 135 - 145 mEq/L 139 138 139  Potassium 3.5 - 5.1 mEq/L 4.6 5.0 4.8  Chloride 96 - 112 mEq/L 108 105 104  CO2 19 - 32 mEq/L 22 27 29   Calcium 8.4 - 10.5 mg/dL 9.4 9.8 10.1  Total Protein 6.0 - 8.3 g/dL 6.9 7.0 7.6  Total Bilirubin 0.2 - 1.2 mg/dL 0.3 0.6 0.5  Alkaline Phos 39 - 117 U/L 36(L) 39 40  AST 0 - 37 U/L 38(H) 20 43(H)  ALT 0 - 53 U/L 41 21 35   Labs 10/19/2020: Iron 93. Ferritin 377.5.  Past Medical History:  Diagnosis Date   Colon polyps    Diabetes mellitus    Environmental allergies    Hyperlipidemia    Hypertension    Hyperthyroidism    Kidney stones    Low testosterone    Prostate cancer [facility])    Past Surgical History:  Procedure Laterality Date   PROSTATE BIOPSY     VASECTOMY    He denies any problems with sedation/anesthesia, airway management/intubation  Social History: He is retired.  He has 1 son and 1 daughter.  He is a past smoker.  No alcohol use.  No drug use.  Family History: Mother was diagnosed with colon cancer in her late 61s and brother was diagnosed with colon cancer in his late 30s to early 29s.  Mother also had  heart disease and diabetes.  Brother with prostate cancer.  Allergies  Allergen Reactions   [name] [Yohimbine] Palpitations      Outpatient Encounter Medications as of 12/06/2020  Medication Sig   Cinnamon 500 MG capsule Take 500 mg by mouth daily.   co-enzyme Q-10 30 MG capsule Take 30 mg by mouth 3 (three) times daily.   Continuous Blood Gluc Sensor (DEXCOM G6 SENSOR) MISC 1 each by Does not apply route See admin instructions. Change every 10 days   Continuous Blood Gluc Transmit (DEXCOM G6 TRANSMITTER) MISC Use as Directed   diclofenac sodium (VOLTAREN) 1 % GEL APPLY 4 G TOPICALLY 4 (FOUR) TIMES DAILY.   fenofibrate 160 MG tablet TAKE 1 TABLET BY MOUTH EVERY DAY   gentamicin cream (GARAMYCIN) 0.1 % Apply 1 application topically 2 (two) times daily.   glucose blood (ACCU-CHEK  SMARTVIEW) test strip 1 each by Other route 2 (two) times a day.   insulin NPH Human (HUMULIN N) 100 UNIT/ML injection Inject 0.85 mLs (85 Units total) into the skin 2 (two) times daily before a meal.   Insulin Syringe-Needle U-100 (BD INSULIN SYRINGE U/F) 31G X 5/16" 1 ML MISC 1 EACH BY OTHER ROUTE 2 (TWO) TIMES DAILY. AS DIRECTED   levothyroxine (SYNTHROID) 112 MCG tablet TAKE 1 TABLET BY MOUTH EVERY DAY   loratadine (CLARITIN) 10 MG tablet TAKE 1 TABLET BY MOUTH EVERY DAY   losartan (COZAAR) 100 MG tablet TAKE 1 TABLET BY MOUTH EVERY DAY   Omega 3-6-9 Fatty Acids (OMEGA 3-6-9 COMPLEX PO) Take by mouth.   tamsulosin (FLOMAX) 0.4 MG CAPS capsule Take 0.4 mg by mouth daily.   Turmeric 1053 MG TABS Take by mouth.   vitamin B-6 (PYRIDOXINE) 25 MG tablet Take 25 mg by mouth daily.   No facility-administered encounter medications on file as of 12/06/2020.     REVIEW OF SYSTEMS:  Gen: Denies fever, sweats or chills. No weight loss.  CV: Denies chest pain, palpitations or edema. Resp: Denies cough, shortness of breath of hemoptysis.  GI: See HPI.  GU : Denies urinary burning, blood in urine, increased urinary frequency or incontinence. MS: Denies joint pain, muscles aches or weakness. Derm: Denies rash, itchiness, skin lesions or unhealing ulcers. Psych: Denies depression, anxiety, memory loss, suicidal ideation and confusion. Heme: Denies bruising, bleeding. Neuro:  Denies headaches, dizziness or paresthesias. Endo:  Denies any problems with DM, thyroid or adrenal function.   PHYSICAL EXAM: BP 130/60   Pulse 67   Ht 6\' 2"  (1.88 m)   Wt 289 lb (131.1 kg)   BMI 37.11 kg/m   General: 65 year old male in no acute distress. Head: Normocephalic and atraumatic. Eyes:  Sclerae non-icteric, conjunctive pink. Ears: Normal auditory acuity. Mouth: Dentition intact. No ulcers or lesions.  Neck: Supple, no lymphadenopathy or thyromegaly.  Lungs: Clear bilaterally to auscultation without  wheezes, crackles or rhonchi. Heart: Regular rate and rhythm. No murmur, rub or gallop appreciated.  Abdomen: Soft, nontender, non distended. No masses. No hepatosplenomegaly. Normoactive bowel sounds x 4 quadrants.  Rectal: Deferred. Musculoskeletal: Symmetrical with no gross deformities. Skin: Warm and dry. No rash or lesions on visible extremities. Extremities: No edema. Neurological: Alert oriented x 4, no focal deficits.  Psychological:  Alert and cooperative. Normal mood and affect.  ASSESSMENT AND PLAN:  51) 65 year old male with normocytic anemia. Normal iron and ferritin level. No overt GI blood loss. Likely due from CKD. -Repeat CBC today -See plan in Q# 2 and # 3  2) Significant family history of colon cancer (mother and brother). Reported normal colonoscopy in 2012.  -Colonoscopy benefits and risks discussed including risk with sedation, risk of bleeding, perforation and infection  -Request copy of 2012 colonoscopy, possibly done by Dr. Benson Norway   3) History of H. Pylori + IgG ab treated with Prevpak in 2016. No GERD symptoms. Never had an EGD. -EGD deferred for now as he is asymptomatic with normal iron levels, I will consult with Dr. Ardis Hughs to verify if an EGD is warranted at the time of his colonoscopy.   4) CKD stge III followed by nephrologist Dr. Edrick Oh with [facility]  -Continue follow-up with nephrology  5) DM II on insulin   6) History of prostate cancer, no treatment or surgery, monitored by urology   7) Sleep apnea uses CPAP  Further recommendations to be determined after the above evaluation completed    CC:  Ann Held, *

## 2020-12-06 ENCOUNTER — Encounter: Payer: Self-pay | Admitting: Nurse Practitioner

## 2020-12-06 ENCOUNTER — Other Ambulatory Visit (INDEPENDENT_AMBULATORY_CARE_PROVIDER_SITE_OTHER): Payer: 59

## 2020-12-06 ENCOUNTER — Ambulatory Visit: Payer: 59 | Admitting: Nurse Practitioner

## 2020-12-06 VITALS — BP 130/60 | HR 67 | Ht 74.0 in | Wt 289.0 lb

## 2020-12-06 DIAGNOSIS — N184 Chronic kidney disease, stage 4 (severe): Secondary | ICD-10-CM | POA: Diagnosis not present

## 2020-12-06 DIAGNOSIS — E1165 Type 2 diabetes mellitus with hyperglycemia: Secondary | ICD-10-CM

## 2020-12-06 DIAGNOSIS — D649 Anemia, unspecified: Secondary | ICD-10-CM

## 2020-12-06 LAB — CBC
HCT: 32 % — ABNORMAL LOW (ref 39.0–52.0)
Hemoglobin: 10.7 g/dL — ABNORMAL LOW (ref 13.0–17.0)
MCHC: 33.5 g/dL (ref 30.0–36.0)
MCV: 94.7 fl (ref 78.0–100.0)
Platelets: 242 10*3/uL (ref 150.0–400.0)
RBC: 3.38 Mil/uL — ABNORMAL LOW (ref 4.22–5.81)
RDW: 13.1 % (ref 11.5–15.5)
WBC: 4.8 10*3/uL (ref 4.0–10.5)

## 2020-12-06 NOTE — Patient Instructions (Signed)
If you are age 63 or older, your body mass index should be between 23-30. Your Body mass index is 37.11 kg/m. If this is out of the aforementioned range listed, please consider follow up with your Primary Care Provider.  If you are age 67 or younger, your body mass index should be between 19-25. Your Body mass index is 37.11 kg/m. If this is out of the aformentioned range listed, please consider follow up with your Primary Care Provider.   LABS:  Lab work has been ordered for you today. Our lab is located in the basement. Press "B" on the elevator. The lab is located at the first door on the left as you exit the elevator.  HEALTHCARE LAWS AND MY CHART RESULTS: Due to recent changes in healthcare laws, you may see the results of your imaging and laboratory studies on MyChart before your provider has had a chance to review them.   We understand that in some cases there may be results that are confusing or concerning to you. Not all laboratory results come back in the same time frame and the provider may be waiting for multiple results in order to interpret others.  Please give [place] 48 hours in order for your provider to thoroughly review all the results before contacting the office for clarification of your results.   PROCEDURES: You have been scheduled for a colonoscopy. Please follow the written instructions given to you at your visit today. If you use inhalers (even only as needed), please bring them with you on the day of your procedure.  It was great seeing you today! Thank you for entrusting me with your care and choosing [facility].  Noralyn Pick, CRNP  The [facility] providers would like to encourage you to use [facility] to communicate with providers for non-urgent requests or questions.  Due to long hold times on the telephone, sending your provider a message by [facility] may be faster and more efficient way to get a response. Please allow 48 business hours for a  response.  Please remember that this is for non-urgent requests/questions.

## 2020-12-07 NOTE — Progress Notes (Signed)
I agree with the above note, plan.  I do not think he needs an EGD since he has normocytic anemia, no iron deficiency and he has no upper GI symptoms.  Thanks

## 2020-12-07 NOTE — Progress Notes (Signed)
I called Alex Rocha and informed him an EGD was not required as verified by Dr. Ardis Hughs.  His hemoglobin level was stable but still low and I advised him to  follow-up with his nephrologist regarding his anemia.  He will proceed with a colonoscopy as scheduled.  Further recommendations to be determined after colonoscopy completed.

## 2020-12-09 ENCOUNTER — Other Ambulatory Visit: Payer: Self-pay

## 2020-12-10 ENCOUNTER — Encounter: Payer: Self-pay | Admitting: Family Medicine

## 2020-12-10 ENCOUNTER — Ambulatory Visit (INDEPENDENT_AMBULATORY_CARE_PROVIDER_SITE_OTHER): Payer: 59 | Admitting: Family Medicine

## 2020-12-10 VITALS — BP 138/60 | HR 68 | Temp 98.8°F | Resp 18 | Ht 74.0 in | Wt 287.6 lb

## 2020-12-10 DIAGNOSIS — I10 Essential (primary) hypertension: Secondary | ICD-10-CM

## 2020-12-10 DIAGNOSIS — Z23 Encounter for immunization: Secondary | ICD-10-CM | POA: Diagnosis not present

## 2020-12-10 DIAGNOSIS — G4733 Obstructive sleep apnea (adult) (pediatric): Secondary | ICD-10-CM

## 2020-12-10 DIAGNOSIS — Z0000 Encounter for general adult medical examination without abnormal findings: Secondary | ICD-10-CM | POA: Diagnosis not present

## 2020-12-10 DIAGNOSIS — J310 Chronic rhinitis: Secondary | ICD-10-CM | POA: Diagnosis not present

## 2020-12-10 DIAGNOSIS — E890 Postprocedural hypothyroidism: Secondary | ICD-10-CM

## 2020-12-10 DIAGNOSIS — J4520 Mild intermittent asthma, uncomplicated: Secondary | ICD-10-CM | POA: Diagnosis not present

## 2020-12-10 DIAGNOSIS — E785 Hyperlipidemia, unspecified: Secondary | ICD-10-CM | POA: Diagnosis not present

## 2020-12-10 DIAGNOSIS — E1169 Type 2 diabetes mellitus with other specified complication: Secondary | ICD-10-CM

## 2020-12-10 DIAGNOSIS — E1165 Type 2 diabetes mellitus with hyperglycemia: Secondary | ICD-10-CM

## 2020-12-10 DIAGNOSIS — C61 Malignant neoplasm of prostate: Secondary | ICD-10-CM

## 2020-12-10 LAB — LIPID PANEL
Cholesterol: 159 mg/dL (ref 0–200)
HDL: 36.5 mg/dL — ABNORMAL LOW (ref >39.00)
LDL Cholesterol: 97 mg/dL (ref 0–99)
NonHDL: 122.8
Total CHOL/HDL Ratio: 4
Triglycerides: 130 mg/dL (ref 0.0–149.0)
VLDL: 26 mg/dL (ref 0.0–40.0)

## 2020-12-10 LAB — COMPREHENSIVE METABOLIC PANEL
ALT: 40 U/L (ref 0–53)
AST: 32 U/L (ref 0–37)
Albumin: 4.6 g/dL (ref 3.5–5.2)
Alkaline Phosphatase: 36 U/L — ABNORMAL LOW (ref 39–117)
BUN: 47 mg/dL — ABNORMAL HIGH (ref 6–23)
CO2: 24 mEq/L (ref 19–32)
Calcium: 10.1 mg/dL (ref 8.4–10.5)
Chloride: 105 mEq/L (ref 96–112)
Creatinine, Ser: 1.75 mg/dL — ABNORMAL HIGH (ref 0.40–1.50)
GFR: 41.1 mL/min — ABNORMAL LOW (ref >60.00)
Glucose, Bld: 140 mg/dL — ABNORMAL HIGH (ref 70–99)
Potassium: 4.5 mEq/L (ref 3.5–5.1)
Sodium: 139 mEq/L (ref 135–145)
Total Bilirubin: 0.3 mg/dL (ref 0.2–1.2)
Total Protein: 7.6 g/dL (ref 6.0–8.3)

## 2020-12-10 LAB — CBC WITH DIFFERENTIAL/PLATELET
Basophils Absolute: 0 10*3/uL (ref 0.0–0.1)
Basophils Relative: 0.7 % (ref 0.0–3.0)
Eosinophils Absolute: 0.2 10*3/uL (ref 0.0–0.7)
Eosinophils Relative: 3.3 % (ref 0.0–5.0)
HCT: 33.4 % — ABNORMAL LOW (ref 39.0–52.0)
Hemoglobin: 11.2 g/dL — ABNORMAL LOW (ref 13.0–17.0)
Lymphocytes Relative: 34.8 % (ref 12.0–46.0)
Lymphs Abs: 2.1 10*3/uL (ref 0.7–4.0)
MCHC: 33.6 g/dL (ref 30.0–36.0)
MCV: 94.8 fl (ref 78.0–100.0)
Monocytes Absolute: 0.6 10*3/uL (ref 0.1–1.0)
Monocytes Relative: 9.5 % (ref 3.0–12.0)
Neutro Abs: 3.1 10*3/uL (ref 1.4–7.7)
Neutrophils Relative %: 51.7 % (ref 43.0–77.0)
Platelets: 256 10*3/uL (ref 150.0–400.0)
RBC: 3.52 Mil/uL — ABNORMAL LOW (ref 4.22–5.81)
RDW: 12.9 % (ref 11.5–15.5)
WBC: 5.9 10*3/uL (ref 4.0–10.5)

## 2020-12-10 LAB — TSH: TSH: 1.82 u[IU]/mL (ref 0.35–5.50)

## 2020-12-10 NOTE — Patient Instructions (Signed)
Preventive Care 40-64 Years Old, Male Preventive care refers to lifestyle choices and visits with your health care provider that can promote health and wellness. This includes: A yearly physical exam. This is also called an annual wellness visit. Regular dental and eye exams. Immunizations. Screening for certain conditions. Healthy lifestyle choices, such as: Eating a healthy diet. Getting regular exercise. Not using drugs or products that contain nicotine and tobacco. Limiting alcohol use. What can I expect for my preventive care visit? Physical exam Your health care provider will check your: Height and weight. These may be used to calculate your BMI (body mass index). BMI is a measurement that tells if you are at a healthy weight. Heart rate and blood pressure. Body temperature. Skin for abnormal spots. Counseling Your health care provider may ask you questions about your: Past medical problems. Family's medical history. Alcohol, tobacco, and drug use. Emotional well-being. Home life and relationship well-being. Sexual activity. Diet, exercise, and sleep habits. Work and work environment. Access to firearms. What immunizations do I need?  Vaccines are usually given at various ages, according to a schedule. Your health care provider will recommend vaccines for you based on your age, medicalhistory, and lifestyle or other factors, such as travel or where you work. What tests do I need? Blood tests Lipid and cholesterol levels. These may be checked every 5 years, or more often if you are over 50 years old. Hepatitis C test. Hepatitis B test. Screening Lung cancer screening. You may have this screening every year starting at age 55 if you have a 30-pack-year history of smoking and currently smoke or have quit within the past 15 years. Prostate cancer screening. Recommendations will vary depending on your family history and other risks. Genital exam to check for testicular cancer  or hernias. Colorectal cancer screening. All adults should have this screening starting at age 50 and continuing until age 75. Your health care provider may recommend screening at age 45 if you are at increased risk. You will have tests every 1-10 years, depending on your results and the type of screening test. Diabetes screening. This is done by checking your blood sugar (glucose) after you have not eaten for a while (fasting). You may have this done every 1-3 years. STD (sexually transmitted disease) testing, if you are at risk. Follow these instructions at home: Eating and drinking  Eat a diet that includes fresh fruits and vegetables, whole grains, lean protein, and low-fat dairy products. Take vitamin and mineral supplements as recommended by your health care provider. Do not drink alcohol if your health care provider tells you not to drink. If you drink alcohol: Limit how much you have to 0-2 drinks a day. Be aware of how much alcohol is in your drink. In the [place] one drink equals one 12 oz bottle of beer (355 mL), one 5 oz glass of wine (148 mL), or one 1 oz glass of hard liquor (44 mL).  Lifestyle Take daily care of your teeth and gums. Brush your teeth every morning and night with fluoride toothpaste. Floss one time each day. Stay active. Exercise for at least 30 minutes 5 or more days each week. Do not use any products that contain nicotine or tobacco, such as cigarettes, e-cigarettes, and chewing tobacco. If you need help quitting, ask your health care provider. Do not use drugs. If you are sexually active, practice safe sex. Use a condom or other form of protection to prevent STIs (sexually transmitted infections). If told by   your health care provider, take low-dose aspirin daily starting at age 50. Find healthy ways to cope with stress, such as: Meditation, yoga, or listening to music. Journaling. Talking to a trusted person. Spending time with friends and  family. Safety Always wear your seat belt while driving or riding in a vehicle. Do not drive: If you have been drinking alcohol. Do not ride with someone who has been drinking. When you are tired or distracted. While texting. Wear a helmet and other protective equipment during sports activities. If you have firearms in your house, make sure you follow all gun safety procedures. What's next? Go to your health care provider once a year for an annual wellness visit. Ask your health care provider how often you should have your eyes and teeth checked. Stay up to date on all vaccines. This information is not intended to replace advice given to you by your health care provider. Make sure you discuss any questions you have with your healthcare provider. Document Revised: 12/31/2018 Document Reviewed: 03/28/2018 Elsevier Patient Education  2022 Elsevier Inc.  

## 2020-12-10 NOTE — Assessment & Plan Note (Signed)
ghm utd Check labs  

## 2020-12-10 NOTE — Progress Notes (Signed)
Subjective:   By signing my name below, I, Zite Okoli, attest that this documentation has been prepared under the direction and in the presence of Ann Held, DO. 12/10/2020   Patient ID: Alex Rocha, male    DOB: 1955-12-04, 65 y.o.   MRN: [id number]  Chief Complaint  Patient presents with   Annual Exam    Pt states no fasting     HPI Patient is in today for a comprehensive physical exam.  He recently lost his transmitter for his glucose meter and was able to get a new one.  He has been managing his sugar levels with Dexcom and is doing well on it. Lab Results  Component Value Date   HGBA1C 5.7 (A) 11/29/2020    He has been experiencing fatigue and insomnia. He had a sleep study done and uses a CPAP machine.  He mentions he has not been exercising regularly because of his fatigue. He has reduced his walking from 6 times a week to once a week but plans on increasing his exercise soon.  He denies fever, hearing loss, ear pain,congestion, sinus pain, sore throat, eye pain, chest pain, palpitations, cough, shortness of breath, wheezing, nausea. vomiting, diarrhea, constipation, blood in stool, dysuria,frequency, hematuria and headaches.   He will be getting a colonoscopy in October 2022.  He is UTD on vision care.  He has 4 Moderna Covid-19 vaccines at this time. He is willing to get the pneumonia vaccine today.  There has been no recent changes in family medical history.  Past Medical History:  Diagnosis Date   Colon polyps    Diabetes mellitus    Environmental allergies    Hyperlipidemia    Hypertension    Hyperthyroidism    Kidney stones    Low testosterone    Prostate cancer ([place])     Past Surgical History:  Procedure Laterality Date   PROSTATE BIOPSY     VASECTOMY      Family History  Problem Relation Age of Onset   Colon cancer Mother    Heart disease Mother    Hypertension Mother        Mother's side of the family   Diabetes Mother         Mother's side of the family   Hyperlipidemia Sister    Hypertension Sister    Diabetes Sister    Heart disease Sister    Osteoarthritis Sister    Diabetes Sister    Cancer Paternal Aunt        unknown type   Diabetes Other        father's family   Breast cancer Neg Hx    Pancreatic cancer Neg Hx     Social History   Socioeconomic History   Marital status: Married    Spouse name: Not on file   Number of children: 2   Years of education: Not on file   Highest education level: Not on file  Occupational History   Occupation: Nurse, adult  Tobacco Use   Smoking status: Never   Smokeless tobacco: Never  Vaping Use   Vaping Use: Never used  Substance and Sexual Activity   Alcohol use: Yes    Alcohol/week: 0.0 standard drinks    Comment: social   Drug use: No   Sexual activity: Yes  Other Topics Concern   Not on file  Social History Narrative   Exercise----not as much--- 1-2 x a week    Social Determinants of Health  Financial Resource Strain: Not on file  Food Insecurity: Not on file  Transportation Needs: Not on file  Physical Activity: Not on file  Stress: Not on file  Social Connections: Not on file  Intimate Partner Violence: Not on file    Outpatient Medications Prior to Visit  Medication Sig Dispense Refill   budesonide-formoterol (SYMBICORT) 160-4.5 MCG/ACT inhaler Inhale 2 puffs into the lungs 2 (two) times daily. 1 each 12   Cinnamon 500 MG capsule Take 500 mg by mouth daily.     co-enzyme Q-10 30 MG capsule Take 30 mg by mouth 3 (three) times daily.     Continuous Blood Gluc Sensor (DEXCOM G6 SENSOR) MISC 1 each by Does not apply route See admin instructions. Change every 10 days 9 each 3   Continuous Blood Gluc Transmit (DEXCOM G6 TRANSMITTER) MISC Use as Directed 1 each 3   diclofenac sodium (VOLTAREN) 1 % GEL APPLY 4 G TOPICALLY 4 (FOUR) TIMES DAILY. 100 g 1   fenofibrate 160 MG tablet TAKE 1 TABLET BY MOUTH EVERY DAY 30 tablet 2    insulin NPH Human (HUMULIN N) 100 UNIT/ML injection Inject 0.85 mLs (85 Units total) into the skin 2 (two) times daily before a meal. 180 mL 3   Insulin Syringe-Needle U-100 (BD INSULIN SYRINGE U/F) 31G X 5/16" 1 ML MISC 1 EACH BY OTHER ROUTE 2 (TWO) TIMES DAILY. AS DIRECTED 180 each 3   levothyroxine (SYNTHROID) 112 MCG tablet TAKE 1 TABLET BY MOUTH EVERY DAY 30 tablet 2   losartan (COZAAR) 100 MG tablet TAKE 1 TABLET BY MOUTH EVERY DAY 30 tablet 2   Omega 3-6-9 Fatty Acids (OMEGA 3-6-9 COMPLEX PO) Take by mouth.     tamsulosin (FLOMAX) 0.4 MG CAPS capsule Take 0.4 mg by mouth daily.     triamcinolone (NASACORT) 55 MCG/ACT AERO nasal inhaler Place 2 sprays into the nose daily. 1 each 12   Turmeric 1053 MG TABS Take by mouth.     vitamin B-6 (PYRIDOXINE) 25 MG tablet Take 25 mg by mouth daily.     gentamicin cream (GARAMYCIN) 0.1 % Apply 1 application topically 2 (two) times daily. (Patient not taking: Reported on 12/10/2020) 30 g 1   glucose blood (ACCU-CHEK SMARTVIEW) test strip 1 each by Other route 2 (two) times a day. (Patient not taking: Reported on 12/10/2020) 100 each 12   loratadine (CLARITIN) 10 MG tablet TAKE 1 TABLET BY MOUTH EVERY DAY (Patient not taking: Reported on 12/10/2020) 30 tablet 4   No facility-administered medications prior to visit.    Allergies  Allergen Reactions   [name] [Yohimbine] Palpitations    Review of Systems  Constitutional:  Positive for malaise/fatigue. Negative for fever.       (+) insomnia  HENT:  Negative for congestion, ear pain, hearing loss, sinus pain and sore throat.   Eyes:  Negative for pain.  Respiratory:  Negative for cough, shortness of breath and wheezing.   Cardiovascular:  Negative for chest pain and palpitations.  Gastrointestinal:  Negative for blood in stool, constipation, diarrhea, nausea and vomiting.  Genitourinary:  Negative for dysuria, frequency and hematuria.  Neurological:  Negative for headaches.      Objective:     Physical Exam Constitutional:      General: He is not in acute distress.    Appearance: Normal appearance. He is not ill-appearing.  HENT:     Head: Normocephalic and atraumatic.     Right Ear: Tympanic membrane, ear canal and external  ear normal.     Left Ear: Tympanic membrane, ear canal and external ear normal.  Eyes:     Pupils: Pupils are equal, round, and reactive to light.  Cardiovascular:     Rate and Rhythm: Normal rate and regular rhythm.     Pulses: Normal pulses.     Heart sounds: No murmur heard.   No gallop.  Pulmonary:     Effort: Pulmonary effort is normal. No respiratory distress.     Breath sounds: Normal breath sounds. No wheezing or rhonchi.  Abdominal:     General: Bowel sounds are normal. There is no distension.     Palpations: Abdomen is soft.     Tenderness: There is no abdominal tenderness. There is no guarding.     Hernia: No hernia is present.  Musculoskeletal:     Cervical back: Neck supple.  Lymphadenopathy:     Cervical: No cervical adenopathy.  Skin:    General: Skin is warm and dry.  Neurological:     Mental Status: He is alert and oriented to person, place, and time.    BP 138/60 (BP Location: Right Arm, Patient Position: Sitting, Cuff Size: Large)   Pulse 68   Temp 98.8 F (37.1 C) (Oral)   Resp 18   Ht 6\' 2"  (1.88 m)   Wt 287 lb 9.6 oz (130.5 kg)   SpO2 96%   BMI 36.93 kg/m  Wt Readings from Last 3 Encounters:  12/10/20 287 lb 9.6 oz (130.5 kg)  12/06/20 289 lb (131.1 kg)  11/29/20 286 lb 6.4 oz (129.9 kg)    Diabetic Foot Exam - Simple   No data filed    Lab Results  Component Value Date   WBC 4.8 12/06/2020   HGB 10.7 (L) 12/06/2020   HCT 32.0 (L) 12/06/2020   PLT 242.0 12/06/2020   GLUCOSE 107 (H) 09/14/2020   CHOL 142 09/14/2020   TRIG 197.0 (H) 09/14/2020   HDL 29.80 (L) 09/14/2020   LDLDIRECT 107.0 06/10/2020   LDLCALC 73 09/14/2020   ALT 41 09/14/2020   AST 38 (H) 09/14/2020   NA 139 09/14/2020   K 4.6  09/14/2020   CL 108 09/14/2020   CREATININE 1.63 (H) 09/14/2020   BUN 30 (H) 09/14/2020   CO2 22 09/14/2020   TSH 2.49 06/10/2020   PSA 5.26 (H) 10/02/2019   HGBA1C 5.7 (A) 11/29/2020   MICROALBUR 1.3 09/14/2020    Lab Results  Component Value Date   TSH 2.49 06/10/2020   Lab Results  Component Value Date   WBC 4.8 12/06/2020   HGB 10.7 (L) 12/06/2020   HCT 32.0 (L) 12/06/2020   MCV 94.7 12/06/2020   PLT 242.0 12/06/2020   Lab Results  Component Value Date   NA 139 09/14/2020   K 4.6 09/14/2020   CO2 22 09/14/2020   GLUCOSE 107 (H) 09/14/2020   BUN 30 (H) 09/14/2020   CREATININE 1.63 (H) 09/14/2020   BILITOT 0.3 09/14/2020   ALKPHOS 36 (L) 09/14/2020   AST 38 (H) 09/14/2020   ALT 41 09/14/2020   PROT 6.9 09/14/2020   ALBUMIN 4.4 09/14/2020   CALCIUM 9.4 09/14/2020   ANIONGAP 8 08/01/2019   GFR 44.83 (L) 09/14/2020   Lab Results  Component Value Date   CHOL 142 09/14/2020   Lab Results  Component Value Date   HDL 29.80 (L) 09/14/2020   Lab Results  Component Value Date   LDLCALC 73 09/14/2020   Lab Results  Component Value  Date   TRIG 197.0 (H) 09/14/2020   Lab Results  Component Value Date   CHOLHDL 5 09/14/2020   Lab Results  Component Value Date   HGBA1C 5.7 (A) 11/29/2020       Colonoscopy: Last completed on 11/26/2010. Done at another hospital. Repeat in 10 years. Due. Dexa: Not completed  Assessment & Plan:   Problem List Items Addressed This Visit       Unprioritized   HTN (hypertension)    Well controlled, no changes to meds. Encouraged heart healthy diet such as the DASH diet and exercise as tolerated.       Relevant Orders   Lipid panel   CBC with Differential/Platelet   TSH   Comprehensive metabolic panel   Hyperlipidemia LDL goal <70    Encourage heart healthy diet such as MIND or DASH diet, increase exercise, avoid trans fats, simple carbohydrates and processed foods, consider a krill or fish or flaxseed oil cap daily.        Hypothyroidism following radioiodine therapy    Check labs       Malignant neoplasm of prostate [facility])    Per urology      Preventative health care - Primary    ghm utd Check labs       Relevant Orders   Lipid panel   CBC with Differential/Platelet   TSH   Comprehensive metabolic panel   Uncontrolled type 2 diabetes mellitus with hyperglycemia ([place])    Check labs       Other Visit Diagnoses     Hyperlipidemia associated with type 2 diabetes mellitus ([place])       Relevant Orders   Lipid panel   CBC with Differential/Platelet   TSH   Comprehensive metabolic panel   Obstructive sleep apnea syndrome       Relevant Orders   Ambulatory referral to Pulmonology   Need for pneumococcal vaccination       Relevant Orders   Pneumococcal conjugate vaccine 20-valent (Prevnar 20) (Completed)   Mild intermittent asthma, unspecified whether complicated       Relevant Medications   budesonide-formoterol (SYMBICORT) 160-4.5 MCG/ACT inhaler   Nonallergic rhinitis       Relevant Medications   triamcinolone (NASACORT) 55 MCG/ACT AERO nasal inhaler        No orders of the defined types were placed in this encounter.   I,Zite Okoli,acting as a Education administrator for [employer], DO.,have documented all relevant documentation on the behalf of Ann Held, DO,as directed by  Ann Held, DO while in the presence of Ann Held, [place], DO., personally preformed the services described in this documentation.  All medical record entries made by the scribe were at my direction and in my presence.  I have reviewed the chart and discharge instructions (if applicable) and agree that the record reflects my personal performance and is accurate and complete. 12/10/2020

## 2020-12-10 NOTE — Assessment & Plan Note (Signed)
Encourage heart healthy diet such as MIND or DASH diet, increase exercise, avoid trans fats, simple carbohydrates and processed foods, consider a krill or fish or flaxseed oil cap daily.  °

## 2020-12-10 NOTE — Assessment & Plan Note (Signed)
Check labs 

## 2020-12-10 NOTE — Assessment & Plan Note (Signed)
Per u rology 

## 2020-12-10 NOTE — Assessment & Plan Note (Signed)
Well controlled, no changes to meds. Encouraged heart healthy diet such as the DASH diet and exercise as tolerated.  °

## 2020-12-14 ENCOUNTER — Other Ambulatory Visit: Payer: Self-pay | Admitting: Family Medicine

## 2020-12-18 ENCOUNTER — Other Ambulatory Visit: Payer: Self-pay | Admitting: Family Medicine

## 2021-01-07 ENCOUNTER — Other Ambulatory Visit: Payer: Self-pay | Admitting: Family Medicine

## 2021-01-07 ENCOUNTER — Encounter: Payer: 59 | Admitting: Gastroenterology

## 2021-01-16 ENCOUNTER — Other Ambulatory Visit: Payer: Self-pay | Admitting: Family Medicine

## 2021-01-16 DIAGNOSIS — J069 Acute upper respiratory infection, unspecified: Secondary | ICD-10-CM

## 2021-02-01 ENCOUNTER — Ambulatory Visit (AMBULATORY_SURGERY_CENTER): Payer: 59 | Admitting: Gastroenterology

## 2021-02-01 ENCOUNTER — Encounter: Payer: Self-pay | Admitting: Gastroenterology

## 2021-02-01 ENCOUNTER — Other Ambulatory Visit: Payer: Self-pay

## 2021-02-01 VITALS — BP 121/59 | HR 52 | Temp 97.8°F | Resp 11 | Ht 74.0 in | Wt 289.0 lb

## 2021-02-01 DIAGNOSIS — D649 Anemia, unspecified: Secondary | ICD-10-CM

## 2021-02-01 DIAGNOSIS — D125 Benign neoplasm of sigmoid colon: Secondary | ICD-10-CM

## 2021-02-01 MED ORDER — SODIUM CHLORIDE 0.9 % IV SOLN: 500.0000 mL | Freq: Once | INTRAVENOUS | Status: DC

## 2021-02-01 NOTE — Op Note (Signed)
[facility] Patient Name: Alex Rocha Procedure Date: 02/01/2021 10:02 AM MRN: [id number] Endoscopist: Milus Banister , MD Age: 65 Referring MD:  Date of Birth: June 18, 1955 Gender: Male Account #: [id number] Procedure:                Colonoscopy Indications:              Screening patient at increased risk: Family history                            of colorectal cancer in multiple 1st-degree                            relatives; Mother and brother diagnosed with CRC                            before age 73, normocytic anemia Medicines:                Monitored Anesthesia Care Procedure:                Pre-Anesthesia Assessment:                           - Prior to the procedure, a History and Physical                            was performed, and patient medications and                            allergies were reviewed. The patient's tolerance of                            previous anesthesia was also reviewed. The risks                            and benefits of the procedure and the sedation                            options and risks were discussed with the patient.                            All questions were answered, and informed consent                            was obtained. Prior Anticoagulants: The patient has                            taken no previous anticoagulant or antiplatelet                            agents. ASA Grade Assessment: III - A patient with                            severe systemic disease. After reviewing the risks  and benefits, the patient was deemed in                            satisfactory condition to undergo the procedure.                           After obtaining informed consent, the colonoscope                            was passed under direct vision. Throughout the                            procedure, the patient's blood pressure, pulse, and                            oxygen saturations were  monitored continuously. The                            CF [id number] #[id number] was introduced through the anus                            and advanced to the the cecum, identified by                            appendiceal orifice and ileocecal valve. The                            colonoscopy was performed without difficulty. The                            patient tolerated the procedure well. The quality                            of the bowel preparation was good. The ileocecal                            valve, appendiceal orifice, and rectum were                            photographed. Scope In: 10:06:53 AM Scope Out: 01:74:94 AM Scope Withdrawal Time: 0 hours 7 minutes 41 seconds  Total Procedure Duration: 0 hours 9 minutes 25 seconds  Findings:                 A 3 mm polyp was found in the sigmoid colon. The                            polyp was sessile. The polyp was removed with a                            cold snare. Resection and retrieval were complete.                           The exam was otherwise without abnormality on  direct and retroflexion views. Complications:            No immediate complications. Estimated blood loss:                            None. Estimated Blood Loss:     Estimated blood loss: none. Impression:               - One 3 mm polyp in the sigmoid colon, removed with                            a cold snare. Resected and retrieved.                           - The examination was otherwise normal on direct                            and retroflexion views. Recommendation:           - Patient has a contact number available for                            emergencies. The signs and symptoms of potential                            delayed complications were discussed with the                            patient. Return to normal activities tomorrow.                            Written discharge instructions were provided to the                             patient.                           - Resume previous diet.                           - Continue present medications.                           - Await pathology results. Milus Banister, MD 02/01/2021 10:19:19 AM This report has been signed electronically.

## 2021-02-01 NOTE — Progress Notes (Signed)
HPI: This is a man with FH of colon cancer, normocytic anemia  ROS: complete GI ROS as described in HPI, all other review negative.  Constitutional:  No unintentional weight loss   Past Medical History:  Diagnosis Date   Colon polyps    Diabetes mellitus    Environmental allergies    Hyperlipidemia    Hypertension    Hyperthyroidism    Kidney stones    Low testosterone    Prostate cancer ([place])     Past Surgical History:  Procedure Laterality Date   PROSTATE BIOPSY     VASECTOMY      Current Outpatient Medications  Medication Sig Dispense Refill   budesonide-formoterol (SYMBICORT) 160-4.5 MCG/ACT inhaler Inhale 2 puffs into the lungs 2 (two) times daily. 1 each 12   Cinnamon 500 MG capsule Take 500 mg by mouth daily.     co-enzyme Q-10 30 MG capsule Take 30 mg by mouth 3 (three) times daily.     Continuous Blood Gluc Sensor (DEXCOM G6 SENSOR) MISC 1 each by Does not apply route See admin instructions. Change every 10 days 9 each 3   Continuous Blood Gluc Transmit (DEXCOM G6 TRANSMITTER) MISC Use as Directed 1 each 3   diclofenac sodium (VOLTAREN) 1 % GEL APPLY 4 G TOPICALLY 4 (FOUR) TIMES DAILY. 100 g 1   fenofibrate 160 MG tablet TAKE 1 TABLET BY MOUTH EVERY DAY 90 tablet 1   insulin NPH Human (HUMULIN N) 100 UNIT/ML injection Inject 0.85 mLs (85 Units total) into the skin 2 (two) times daily before a meal. 180 mL 3   Insulin Syringe-Needle U-100 (BD INSULIN SYRINGE U/F) 31G X 5/16" 1 ML MISC 1 EACH BY OTHER ROUTE 2 (TWO) TIMES DAILY. AS DIRECTED 180 each 3   levothyroxine (SYNTHROID) 112 MCG tablet TAKE 1 TABLET BY MOUTH EVERY DAY 30 tablet 2   loratadine (CLARITIN) 10 MG tablet TAKE 1 TABLET BY MOUTH EVERY DAY 90 tablet 1   losartan (COZAAR) 100 MG tablet TAKE 1 TABLET BY MOUTH EVERY DAY 30 tablet 2   Omega 3-6-9 Fatty Acids (OMEGA 3-6-9 COMPLEX PO) Take by mouth.     tamsulosin (FLOMAX) 0.4 MG CAPS capsule Take 0.4 mg by mouth daily.     triamcinolone (NASACORT) 55  MCG/ACT AERO nasal inhaler Place 2 sprays into the nose daily. 1 each 12   Turmeric 1053 MG TABS Take by mouth.     vitamin B-6 (PYRIDOXINE) 25 MG tablet Take 25 mg by mouth daily.     Current Facility-Administered Medications  Medication Dose Route Frequency Provider Last Rate Last Admin   0.9 %  sodium chloride infusion  500 mL Intravenous Once Milus Banister, MD        Allergies as of 02/01/2021 - Review Complete 02/01/2021  Allergen Reaction Noted   [name] [yohimbine] Palpitations 01/11/2015    Family History  Problem Relation Age of Onset   Colon cancer Mother    Heart disease Mother    Hypertension Mother        Mother's side of the family   Diabetes Mother        Mother's side of the family   Hyperlipidemia Sister    Hypertension Sister    Diabetes Sister    Heart disease Sister    Osteoarthritis Sister    Diabetes Sister    Cancer Paternal Aunt        unknown type   Diabetes Other  father's family   Breast cancer Neg Hx    Pancreatic cancer Neg Hx     Social History   Socioeconomic History   Marital status: Married    Spouse name: Not on file   Number of children: 2   Years of education: Not on file   Highest education level: Not on file  Occupational History   Occupation: Nurse, adult  Tobacco Use   Smoking status: Never   Smokeless tobacco: Never  Vaping Use   Vaping Use: Never used  Substance and Sexual Activity   Alcohol use: Yes    Alcohol/week: 0.0 standard drinks    Comment: social   Drug use: No   Sexual activity: Yes  Other Topics Concern   Not on file  Social History Narrative   Exercise----not as much--- 1-2 x a week    Social Determinants of Health   Financial Resource Strain: Not on file  Food Insecurity: Not on file  Transportation Needs: Not on file  Physical Activity: Not on file  Stress: Not on file  Social Connections: Not on file  Intimate Partner Violence: Not on file     Physical Exam: BP (!)  115/46   Pulse 69   Temp 97.8 F (36.6 C) (Temporal)   Ht 6\' 2"  (6.55 m)   Wt 289 lb (131.1 kg)   SpO2 99%   BMI 37.11 kg/m  Constitutional: generally well-appearing Psychiatric: alert and oriented x3 Lungs: CTA bilaterally Heart: no MCR  Assessment and plan: 65 y.o. male with fh of colon cancer, nomrocytic anemia  Colonoscoyp today  Care is appropriate for the ambulatory setting.  Owens Loffler, MD [facility] 02/01/2021, 9:41 AM

## 2021-02-01 NOTE — Progress Notes (Signed)
Report to PACU, RN, vss, BBS= Clear.  

## 2021-02-01 NOTE — Patient Instructions (Signed)
YOU HAD AN ENDOSCOPIC PROCEDURE TODAY AT THE [facility]:   Refer to the procedure report that was given to you for any specific questions about what was found during the examination.  If the procedure report does not answer your questions, please call your gastroenterologist to clarify.  If you requested that your care partner not be given the details of your procedure findings, then the procedure report has been included in a sealed envelope for you to review at your convenience later.  **Handouts given on polyps**  YOU SHOULD EXPECT: Some feelings of bloating in the abdomen. Passage of more gas than usual.  Walking can help get rid of the air that was put into your GI tract during the procedure and reduce the bloating. If you had a lower endoscopy (such as a colonoscopy or flexible sigmoidoscopy) you may notice spotting of blood in your stool or on the toilet paper. If you underwent a bowel prep for your procedure, you may not have a normal bowel movement for a few days.  Please Note:  You might notice some irritation and congestion in your nose or some drainage.  This is from the oxygen used during your procedure.  There is no need for concern and it should clear up in a day or so.  SYMPTOMS TO REPORT IMMEDIATELY:  Following lower endoscopy (colonoscopy or flexible sigmoidoscopy):  Excessive amounts of blood in the stool  Significant tenderness or worsening of abdominal pains  Swelling of the abdomen that is new, acute  Fever of 100F or higher  For urgent or emergent issues, a gastroenterologist can be reached at any hour by calling [phone]. Do not use MyChart messaging for urgent concerns.    DIET:  We do recommend a small meal at first, but then you may proceed to your regular diet.  Drink plenty of fluids but you should avoid alcoholic beverages for 24 hours.  ACTIVITY:  You should plan to take it easy for the rest of today and you should NOT DRIVE or use heavy  machinery until tomorrow (because of the sedation medicines used during the test).    FOLLOW UP: Our staff will call the number listed on your records 48-72 hours following your procedure to check on you and address any questions or concerns that you may have regarding the information given to you following your procedure. If we do not reach you, we will leave a message.  We will attempt to reach you two times.  During this call, we will ask if you have developed any symptoms of COVID 19. If you develop any symptoms (ie: fever, flu-like symptoms, shortness of breath, cough etc.) before then, please call [phone].  If you test positive for Covid 19 in the 2 weeks post procedure, please call and report this information to us.    If any biopsies were taken you will be contacted by phone or by letter within the next 1-3 weeks.  Please call us at [phone] if you have not heard about the biopsies in 3 weeks.    SIGNATURES/CONFIDENTIALITY: You and/or your care partner have signed paperwork which will be entered into your electronic medical record.  These signatures attest to the fact that that the information above on your After Visit Summary has been reviewed and is understood.  Full responsibility of the confidentiality of this discharge information lies with you and/or your care-partner.  

## 2021-02-01 NOTE — Progress Notes (Signed)
Called to room to assist during endoscopic procedure.  Patient ID and intended procedure confirmed with present staff. Received instructions for my participation in the procedure from the performing physician.  

## 2021-02-03 ENCOUNTER — Telehealth: Payer: Self-pay | Admitting: *Deleted

## 2021-02-03 NOTE — Telephone Encounter (Signed)
  Follow up Call-  Call back number 02/01/2021  Post procedure Call Back phone  # [phone]  Permission to leave phone message Yes  Some recent data might be hidden     Patient questions:  Do you have a fever, pain , or abdominal swelling? No. Pain Score  0 *  Have you tolerated food without any problems? Yes.    Have you been able to return to your normal activities? Yes.    Do you have any questions about your discharge instructions: Diet   No. Medications  No. Follow up visit  No.  Do you have questions or concerns about your Care? No.  Actions: * If pain score is 4 or above: No action needed, pain <4.

## 2021-02-04 ENCOUNTER — Telehealth: Payer: Self-pay

## 2021-02-04 ENCOUNTER — Encounter: Payer: Self-pay | Admitting: Gastroenterology

## 2021-02-04 NOTE — Telephone Encounter (Signed)
Called and spoke to patient about CPAP machine. Patient states he still uses his CPAP and will bring it in Monday so we can receive a compliance report,  I will also give [employer] a call.

## 2021-02-06 ENCOUNTER — Other Ambulatory Visit: Payer: Self-pay | Admitting: Endocrinology

## 2021-02-06 DIAGNOSIS — E890 Postprocedural hypothyroidism: Secondary | ICD-10-CM

## 2021-02-06 DIAGNOSIS — E119 Type 2 diabetes mellitus without complications: Secondary | ICD-10-CM

## 2021-02-07 ENCOUNTER — Ambulatory Visit: Payer: 59 | Admitting: Internal Medicine

## 2021-02-07 ENCOUNTER — Other Ambulatory Visit: Payer: Self-pay

## 2021-02-07 ENCOUNTER — Encounter: Payer: Self-pay | Admitting: Internal Medicine

## 2021-02-07 VITALS — BP 120/60 | HR 82 | Temp 97.3°F | Ht 74.0 in | Wt 284.2 lb

## 2021-02-07 DIAGNOSIS — G4733 Obstructive sleep apnea (adult) (pediatric): Secondary | ICD-10-CM | POA: Diagnosis not present

## 2021-02-07 NOTE — Patient Instructions (Signed)
EXCELLENT JOB  A++

## 2021-02-07 NOTE — Telephone Encounter (Signed)
Spoke with patient to inform him [employer] have updated CPAP device in [facility]. Nothing further needed.

## 2021-02-07 NOTE — Progress Notes (Signed)
   SYNOPSIS 65 year old male, never smoked. PMH severe OSA, hypertension, DM type 2, sinusitis, GERD. Patient of Dr. Ashby Dawes, last seen on 04/25/18. Home sleep study 12/28/15 >> AHI 86.5, SaO2 low 66. Maintained on auto CPAP 10-20cm H20.    Airview download 03/22/19-04/20/19: 30/30 days used; 100%>4 hours Pressure 10-20cm h20 (MEDIAN- 13.2) Airleaks- 41.8L/min (95%) AHI 2.3  CC Follow up Severe OSA    HPI Follow up OSA Overall seems to be doing well Compliance report unable to obtain today He feels well rested in the morning no daytime fatigue   Review of Systems:  Gen:  Denies  fever, sweats, chills weight loss  HEENT: Denies blurred vision, double vision, ear pain, eye pain, hearing loss, nose bleeds, sore throat Cardiac:  No dizziness, chest pain or heaviness, chest tightness,edema, No JVD Resp:   No cough, -sputum production, -shortness of breath,-wheezing, -hemoptysis,  Other:  All other systems negative  BP 120/60 (BP Location: Left Arm, Patient Position: Sitting, Cuff Size: Large)   Pulse 82   Temp (!) 97.3 F (36.3 C) (Oral)   Ht 6\' 2"  (1.88 m)   Wt 284 lb 3.2 oz (128.9 kg)   SpO2 96%   BMI 36.49 kg/m    Physical Examination:   General Appearance: No distress  EYES PERRLA, EOM intact.   NECK Supple, No JVD Pulmonary: normal breath sounds, No wheezing.  ALL OTHER ROS ARE NEGATIVE    Assessment and Plan:   Severe OSA on CPAP s previous AHI is 85  Auto CPAP 10 to 18 cm water pressure  Continue as prescribed  Obesity -recommend significant weight loss -recommend changing diet  Deconditioned state -Recommend increased daily activity and exercise    MEDICATION ADJUSTMENTS/LABS AND TESTS ORDERED: Continue CPAP as prescribed   CURRENT MEDICATIONS REVIEWED AT LENGTH WITH PATIENT TODAY   Patient  satisfied with Plan of action and management. All questions answered  Follow up 1 year  Total Time Spent 15 mins   Chelci [place] Patricia Pesa, M.D.   [facility]  Medical Director [place] Director [facility] Cardio-Pulmonary Department

## 2021-02-14 ENCOUNTER — Other Ambulatory Visit: Payer: Self-pay | Admitting: Family Medicine

## 2021-03-16 ENCOUNTER — Other Ambulatory Visit: Payer: Self-pay | Admitting: Family Medicine

## 2021-03-19 ENCOUNTER — Other Ambulatory Visit: Payer: Self-pay | Admitting: Family Medicine

## 2021-03-31 ENCOUNTER — Ambulatory Visit: Payer: 59 | Admitting: Endocrinology

## 2021-03-31 ENCOUNTER — Encounter: Payer: Self-pay | Admitting: Endocrinology

## 2021-03-31 ENCOUNTER — Other Ambulatory Visit: Payer: Self-pay

## 2021-03-31 VITALS — BP 140/62 | HR 75 | Ht 74.0 in | Wt 287.6 lb

## 2021-03-31 DIAGNOSIS — E890 Postprocedural hypothyroidism: Secondary | ICD-10-CM

## 2021-03-31 DIAGNOSIS — E119 Type 2 diabetes mellitus without complications: Secondary | ICD-10-CM

## 2021-03-31 DIAGNOSIS — Z794 Long term (current) use of insulin: Secondary | ICD-10-CM | POA: Diagnosis not present

## 2021-03-31 LAB — POCT GLYCOSYLATED HEMOGLOBIN (HGB A1C): Hemoglobin A1C: 5.9 % — AB (ref 4.0–5.6)

## 2021-03-31 MED ORDER — INSULIN NPH (HUMAN) (ISOPHANE) 100 UNIT/ML ~~LOC~~ SUSP: SUBCUTANEOUS | 3 refills | Status: DC

## 2021-03-31 NOTE — Patient Instructions (Addendum)
check your blood sugar twice a day.  vary the time of day when you check, between before the 3 meals, and at bedtime.  also check if you have symptoms of your blood sugar being too high or too low.  please keep a record of the readings and bring it to your next appointment here.  You can write it on any piece of paper.  please call us sooner if your blood sugar goes below 70, or if you have a lot of readings over 200.   I have sent a prescription to your pharmacy, to reduce the insulin to 80 units each morning, and 70 units each evening On this type of insulin schedule, you should eat meals on a regular schedule (especially lunch).  If a meal is missed or significantly delayed, your blood sugar could go low.   Please come back for a follow-up appointment in 4 months.

## 2021-03-31 NOTE — Progress Notes (Signed)
Subjective:    Patient ID: Alex Rocha, male    DOB: [birth year], 65 y.o.   MRN: [id number]  HPI Pt returns for f/u of diabetes mellitus:  DM type: Insulin-requiring type 2. Dx'ed: 2000.  Complications: PN and stage 3 CRI.   Therapy: insulin since 2014.  DKA: never.  Severe hypoglycemia: once, in 2018.   Pancreatitis: never.   SDOH: he says his need to care for his wife, who has cancer, limits his ability to care for himself;  he takes human insulin, due to cost; he works at retirement home.   Other: he declines multiple daily injections; he declines weight-loss surgery.   Interval history: I reviewed continuous glucose monitor data.  Glucose varies from 70-190.  It is in general highest 10PM-12MN.  It decreases overnight.  It is in general lowest 1PM-7PM. No recent steroids.  He has hypoglycemia approx BIW, and these episodes are mild.  He takes 80 units BID.  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   Past Medical History:  Diagnosis Date   Anemia    Cataract    Colon polyps    Diabetes mellitus    Environmental allergies    Hyperlipidemia    Hypertension    Hyperthyroidism    Kidney stones    Low testosterone    Prostate cancer ([place])    Sleep apnea     Past Surgical History:  Procedure Laterality Date   PROSTATE BIOPSY     VASECTOMY      Social History   Socioeconomic History   Marital status: Married    Spouse name: Not on file   Number of children: 2   Years of education: Not on file   Highest education level: Not on file  Occupational History   Occupation: Nurse, adult  Tobacco Use   Smoking status: Never   Smokeless tobacco: Never  Vaping Use   Vaping Use: Never used  Substance and Sexual Activity   Alcohol use: Yes    Comment: rare - on social occasions   Drug use: No   Sexual activity: Yes  Other Topics Concern    Not on file  Social History Narrative   Exercise----not as much--- 1-2 x a week    Social Determinants of Health   Financial Resource Strain: Not on file  Food Insecurity: Not on file  Transportation Needs: Not on file  Physical Activity: Not on file  Stress: Not on file  Social Connections: Not on file  Intimate Partner Violence: Not on file    Current Outpatient Medications on File Prior to Visit  Medication Sig Dispense Refill   budesonide-formoterol (SYMBICORT) 160-4.5 MCG/ACT inhaler Inhale 2 puffs into the lungs 2 (two) times daily. 1 each 12   Cinnamon 500 MG capsule Take 500 mg by mouth daily.     co-enzyme Q-10 30 MG capsule Take 30 mg by mouth 3 (three) times daily.     Continuous Blood Gluc Sensor (DEXCOM G6 SENSOR) MISC 1 each by Does not apply route See admin instructions. Change every 10 days 9 each 3   Continuous Blood Gluc Transmit (DEXCOM G6 TRANSMITTER) MISC Use as Directed 1 each 3   diclofenac sodium (VOLTAREN) 1 % GEL APPLY 4 G TOPICALLY 4 (FOUR) TIMES DAILY. 100 g 1   fenofibrate 160 MG tablet TAKE 1 TABLET BY MOUTH EVERY DAY  90 tablet 1   Insulin Syringe-Needle U-100 (BD INSULIN SYRINGE U/F) 31G X 5/16" 1 ML MISC 1 EACH BY OTHER ROUTE 2 (TWO) TIMES DAILY. AS DIRECTED 180 each 3   levothyroxine (SYNTHROID) 112 MCG tablet TAKE 1 TABLET BY MOUTH EVERY DAY 90 tablet 1   loratadine (CLARITIN) 10 MG tablet TAKE 1 TABLET BY MOUTH EVERY DAY 90 tablet 1   losartan (COZAAR) 100 MG tablet TAKE 1 TABLET BY MOUTH EVERY DAY 30 tablet 2   Omega 3-6-9 Fatty Acids (OMEGA 3-6-9 COMPLEX PO) Take by mouth.     tamsulosin (FLOMAX) 0.4 MG CAPS capsule Take 0.4 mg by mouth daily.     triamcinolone (NASACORT) 55 MCG/ACT AERO nasal inhaler Place 2 sprays into the nose daily. 1 each 12   Turmeric 1053 MG TABS Take by mouth.     vitamin B-6 (PYRIDOXINE) 25 MG tablet Take 25 mg by mouth daily.     No current facility-administered medications on file prior to visit.    Allergies   Allergen Reactions   [name] [Yohimbine] Palpitations    Family History  Problem Relation Age of Onset   Colon cancer Mother    Heart disease Mother    Hypertension Mother        Mother's side of the family   Diabetes Mother        Mother's side of the family   Hyperlipidemia Sister    Hypertension Sister    Diabetes Sister    Heart disease Sister    Osteoarthritis Sister    Diabetes Sister    Colon cancer Brother    Cancer Paternal Aunt        unknown type   Rectal cancer Other    Stomach cancer Other    Heart disease Other    Diabetes Other        father's family   Breast cancer Neg Hx    Pancreatic cancer Neg Hx     BP 140/62    Pulse 75    Ht 6\' 2"  (1.88 m)    Wt 287 lb 9.6 oz (130.5 kg)    SpO2 98%    BMI 36.93 kg/m    Review of Systems     Objective:   Physical Exam  Lab Results  Component Value Date   CREATININE 1.75 (H) 12/10/2020   BUN 47 (H) 12/10/2020   NA 139 12/10/2020   K 4.5 12/10/2020   CL 105 12/10/2020   CO2 24 12/10/2020    Lab Results  Component Value Date   HGBA1C 5.9 (A) 03/31/2021      Assessment & Plan:  Insulin-requiring type 2 DM: overcontrolled  Patient Instructions  check your blood sugar twice a day.  vary the time of day when you check, between before the 3 meals, and at bedtime.  also check if you have symptoms of your blood sugar being too high or too low.  please keep a record of the readings and bring it to your next appointment here.  You can write it on any piece of paper.  please call us sooner if your blood sugar goes below 70, or if you have a lot of readings over 200.   I have sent a prescription to your pharmacy, to reduce the insulin to 80 units each morning, and 70 units each evening On this type of insulin schedule, you should eat meals on a regular schedule (especially lunch).  If a meal is missed or significantly delayed, your  blood sugar could go low.   Please come back for a follow-up appointment in 4  months.

## 2021-05-12 ENCOUNTER — Encounter: Payer: Self-pay | Admitting: Endocrinology

## 2021-06-04 ENCOUNTER — Other Ambulatory Visit: Payer: Self-pay | Admitting: Endocrinology

## 2021-06-15 ENCOUNTER — Other Ambulatory Visit (HOSPITAL_COMMUNITY): Payer: Self-pay

## 2021-06-15 ENCOUNTER — Other Ambulatory Visit: Payer: Self-pay

## 2021-06-15 DIAGNOSIS — E119 Type 2 diabetes mellitus without complications: Secondary | ICD-10-CM

## 2021-06-15 MED ORDER — DEXCOM G6 TRANSMITTER MISC: 3 refills | Status: DC

## 2021-06-18 ENCOUNTER — Other Ambulatory Visit: Payer: Self-pay | Admitting: Family Medicine

## 2021-06-23 ENCOUNTER — Other Ambulatory Visit (HOSPITAL_COMMUNITY): Payer: Self-pay

## 2021-06-23 ENCOUNTER — Telehealth: Payer: Self-pay | Admitting: Pharmacy Technician

## 2021-06-23 NOTE — Telephone Encounter (Signed)
Patient Advocate Encounter ? ?Received notification from [place] ([employer]) that prior authorization for [facility] G6 TRANSMITTER is required. ?  ?PA submitted on 3.9.23 ?Key [id number] ?Status is pending ?  ?[facility] will continue to follow ? ?Porchia Sinkler R Grayson Pfefferle, CPhT ?Patient Advocate ?[facility] ?Phone: [phone] ?Fax:  [phone] ? ? ? ?  ? ?

## 2021-06-30 ENCOUNTER — Other Ambulatory Visit (HOSPITAL_COMMUNITY): Payer: Self-pay

## 2021-07-01 ENCOUNTER — Other Ambulatory Visit: Payer: Self-pay

## 2021-07-01 DIAGNOSIS — E119 Type 2 diabetes mellitus without complications: Secondary | ICD-10-CM

## 2021-07-01 MED ORDER — DEXCOM G6 TRANSMITTER MISC: 3 refills | Status: DC

## 2021-07-08 ENCOUNTER — Other Ambulatory Visit: Payer: Self-pay | Admitting: Family Medicine

## 2021-07-08 DIAGNOSIS — J069 Acute upper respiratory infection, unspecified: Secondary | ICD-10-CM

## 2021-07-21 ENCOUNTER — Other Ambulatory Visit: Payer: Self-pay | Admitting: Family Medicine

## 2021-08-04 ENCOUNTER — Other Ambulatory Visit: Payer: Self-pay | Admitting: Endocrinology

## 2021-08-04 ENCOUNTER — Ambulatory Visit: Payer: 59 | Admitting: Endocrinology

## 2021-08-04 ENCOUNTER — Encounter: Payer: Self-pay | Admitting: Endocrinology

## 2021-08-04 VITALS — BP 152/64 | HR 68 | Ht 74.0 in | Wt 282.4 lb

## 2021-08-04 DIAGNOSIS — E119 Type 2 diabetes mellitus without complications: Secondary | ICD-10-CM | POA: Diagnosis not present

## 2021-08-04 DIAGNOSIS — E890 Postprocedural hypothyroidism: Secondary | ICD-10-CM | POA: Diagnosis not present

## 2021-08-04 DIAGNOSIS — Z794 Long term (current) use of insulin: Secondary | ICD-10-CM | POA: Diagnosis not present

## 2021-08-04 LAB — POCT GLYCOSYLATED HEMOGLOBIN (HGB A1C): Hemoglobin A1C: 6 % — AB (ref 4.0–5.6)

## 2021-08-04 MED ORDER — INSULIN NPH (HUMAN) (ISOPHANE) 100 UNIT/ML ~~LOC~~ SUSP: SUBCUTANEOUS | 1 refills | Status: DC

## 2021-08-04 NOTE — Patient Instructions (Addendum)
Your blood pressure is high today.  Please see your primary care provider soon, to have it rechecked.   ?check your blood sugar twice a day.  vary the time of day when you check, between before the 3 meals, and at bedtime.  also check if you have symptoms of your blood sugar being too high or too low.  please keep a record of the readings and bring it to your next appointment here.  You can write it on any piece of paper.  please call us sooner if your blood sugar goes below 70, or if you have a lot of readings over 200.   ?I have sent a prescription to your pharmacy, to reduce the insulin to 80 units each morning, and 60 units each evening ?On this type of insulin schedule, you should eat meals on a regular schedule (especially lunch).  If a meal is missed or significantly delayed, your blood sugar could go low.   ?You should have an endocrinology follow-up appointment in 4 months.   ? ?

## 2021-08-04 NOTE — Progress Notes (Signed)
? ?Subjective:  ? ? Patient ID: Alex Rocha, male    DOB: 1955/10/13, 66 y.o.   MRN: [id number] ? ?HPI ?Pt returns for f/u of diabetes mellitus:  ?DM type: Insulin-requiring type 2. ?Dx'ed: 2000.  ?Complications: PN and stage 3 CRI.   ?Therapy: insulin since 2014.  ?DKA: never.  ?Severe hypoglycemia: once, in 2018.   ?Pancreatitis: never.   ?SDOH: he says his need to care for his wife, who has cancer, limits his ability to care for himself;  he takes human insulin, due to cost; he works at retirement home.   ?Other: he declines multiple daily injections; he declines weight-loss surgery.   ?Interval history: I reviewed continuous glucose monitor data.  Glucose varies from 75-200.  It is in general highest 10PM.  It decreases overnight.  It is in general lowest 8AM. No recent steroids.  He has hypoglycemia approx 1 every 2 weeks, and these episodes are mild.  This happens fasting.   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   ?Past Medical History:  ?Diagnosis Date  ? Anemia   ? Cataract   ? Colon polyps   ? Diabetes mellitus   ? Environmental allergies   ? Hyperlipidemia   ? Hypertension   ? Hyperthyroidism   ? Kidney stones   ? Low testosterone   ? Prostate cancer ([place])   ? Sleep apnea   ? ? ?Past Surgical History:  ?Procedure Laterality Date  ? PROSTATE BIOPSY    ? VASECTOMY    ? ? ?Social History  ? ?Socioeconomic History  ? Marital status: Married  ?  Spouse name: Not on file  ? Number of children: 2  ? Years of education: Not on file  ? Highest education level: Not on file  ?Occupational History  ? Occupation: Nurse, adult  ?Tobacco Use  ? Smoking status: Never  ? Smokeless tobacco: Never  ?Vaping Use  ? Vaping Use: Never used  ?Substance and Sexual Activity  ? Alcohol use: Yes  ?  Comment: rare - on social occasions  ? Drug use: No  ? Sexual activity: Yes  ?Other Topics Concern   ? Not on file  ?Social History Narrative  ? Exercise----not as much--- 1-2 x a week   ? ?Social Determinants of Health  ? ?Financial Resource Strain: Not on file  ?Food Insecurity: Not on file  ?Transportation Needs: Not on file  ?Physical Activity: Not on file  ?Stress: Not on file  ?Social Connections: Not on file  ?Intimate Partner Violence: Not on file  ? ? ?Current Outpatient Medications on File Prior to Visit  ?Medication Sig Dispense Refill  ? BD INSULIN SYRINGE U/F 31G X 5/16" 1 ML MISC 1 EACH BY OTHER ROUTE 2 (TWO) TIMES DAILY. AS DIRECTED 100 each 7  ? Cinnamon 500 MG capsule Take 500 mg by mouth daily.    ? co-enzyme Q-10 30 MG capsule Take 30 mg by mouth 3 (three) times daily.    ? Continuous Blood Gluc Transmit (DEXCOM G6 TRANSMITTER) MISC Use as Directed 1 each 3  ? diclofenac sodium (VOLTAREN) 1 % GEL APPLY 4 G TOPICALLY 4 (FOUR) TIMES DAILY. 100 g 1  ? fenofibrate 160 MG tablet TAKE 1 TABLET BY MOUTH EVERY DAY 90 tablet 0  ? levothyroxine (SYNTHROID) 112 MCG tablet TAKE 1 TABLET BY MOUTH EVERY DAY 90 tablet  1  ? loratadine (CLARITIN) 10 MG tablet TAKE 1 TABLET BY MOUTH EVERY DAY 90 tablet 1  ? losartan (COZAAR) 100 MG tablet TAKE 1 TABLET (100 MG TOTAL) BY MOUTH DAILY. *DUE FOR LABS/APPOINTMENT* 30 tablet 0  ? Omega 3-6-9 Fatty Acids (OMEGA 3-6-9 COMPLEX PO) Take by mouth.    ? tamsulosin (FLOMAX) 0.4 MG CAPS capsule Take 0.4 mg by mouth daily.    ? Turmeric 1053 MG TABS Take by mouth.    ? ?No current facility-administered medications on file prior to visit.  ? ? ?Allergies  ?Allergen Reactions  ? [name] [Yohimbine] Palpitations  ? ? ?Family History  ?Problem Relation Age of Onset  ? Colon cancer Mother   ? Heart disease Mother   ? Hypertension Mother   ?     Mother's side of the family  ? Diabetes Mother   ?     Mother's side of the family  ? Hyperlipidemia Sister   ? Hypertension Sister   ? Diabetes Sister   ? Heart disease Sister   ? Osteoarthritis Sister   ? Diabetes Sister   ? Colon cancer  Brother   ? Cancer Paternal Aunt   ?     unknown type  ? Rectal cancer Other   ? Stomach cancer Other   ? Heart disease Other   ? Diabetes Other   ?     father's family  ? Breast cancer Neg Hx   ? Pancreatic cancer Neg Hx   ? ? ?BP (!) 152/64 (BP Location: Left Arm, Patient Position: Sitting, Cuff Size: Normal)   Pulse 68   Ht '6\' 2"'$  (1.88 m)   Wt 282 lb 6.4 oz (128.1 kg)   SpO2 96%   BMI 36.26 kg/m?  ? ? ? ?Review of Systems ? ?   ?Objective:  ? Physical Exam ?VITAL SIGNS:  See vs page ?GENERAL: no distress ? ?A1c=6.0% ?   ?Assessment & Plan:  ?Insulin-requiring type 2 DM: overcontrolled.  ? ?Patient Instructions  ?Your blood pressure is high today.  Please see your primary care provider soon, to have it rechecked.   ?check your blood sugar twice a day.  vary the time of day when you check, between before the 3 meals, and at bedtime.  also check if you have symptoms of your blood sugar being too high or too low.  please keep a record of the readings and bring it to your next appointment here.  You can write it on any piece of paper.  please call us sooner if your blood sugar goes below 70, or if you have a lot of readings over 200.   ?I have sent a prescription to your pharmacy, to reduce the insulin to 80 units each morning, and 60 units each evening ?On this type of insulin schedule, you should eat meals on a regular schedule (especially lunch).  If a meal is missed or significantly delayed, your blood sugar could go low.   ?You should have an endocrinology follow-up appointment in 4 months.   ? ? ? ?

## 2021-08-14 ENCOUNTER — Other Ambulatory Visit: Payer: Self-pay | Admitting: Family Medicine

## 2021-09-14 ENCOUNTER — Other Ambulatory Visit: Payer: Self-pay | Admitting: Family Medicine

## 2021-09-15 HISTORY — PX: CATARACT EXTRACTION: SUR2

## 2021-09-26 ENCOUNTER — Other Ambulatory Visit: Payer: Self-pay | Admitting: Family Medicine

## 2021-09-29 ENCOUNTER — Ambulatory Visit: Payer: 59 | Admitting: Family Medicine

## 2021-09-29 ENCOUNTER — Encounter: Payer: Self-pay | Admitting: Family Medicine

## 2021-09-29 VITALS — BP 118/70 | HR 74 | Temp 98.6°F | Resp 18 | Ht 74.0 in | Wt 289.6 lb

## 2021-09-29 DIAGNOSIS — E785 Hyperlipidemia, unspecified: Secondary | ICD-10-CM | POA: Diagnosis not present

## 2021-09-29 DIAGNOSIS — E890 Postprocedural hypothyroidism: Secondary | ICD-10-CM | POA: Diagnosis not present

## 2021-09-29 DIAGNOSIS — I10 Essential (primary) hypertension: Secondary | ICD-10-CM | POA: Diagnosis not present

## 2021-09-29 DIAGNOSIS — E039 Hypothyroidism, unspecified: Secondary | ICD-10-CM | POA: Diagnosis not present

## 2021-09-29 DIAGNOSIS — E1165 Type 2 diabetes mellitus with hyperglycemia: Secondary | ICD-10-CM

## 2021-09-29 DIAGNOSIS — N1832 Chronic kidney disease, stage 3b: Secondary | ICD-10-CM

## 2021-09-29 DIAGNOSIS — E1169 Type 2 diabetes mellitus with other specified complication: Secondary | ICD-10-CM | POA: Diagnosis not present

## 2021-09-29 LAB — COMPREHENSIVE METABOLIC PANEL
ALT: 28 U/L (ref 0–53)
AST: 27 U/L (ref 0–37)
Albumin: 4.3 g/dL (ref 3.5–5.2)
Alkaline Phosphatase: 30 U/L — ABNORMAL LOW (ref 39–117)
BUN: 31 mg/dL — ABNORMAL HIGH (ref 6–23)
CO2: 27 mEq/L (ref 19–32)
Calcium: 9.7 mg/dL (ref 8.4–10.5)
Chloride: 106 mEq/L (ref 96–112)
Creatinine, Ser: 1.58 mg/dL — ABNORMAL HIGH (ref 0.40–1.50)
GFR: 46.2 mL/min — ABNORMAL LOW (ref >60.00)
Glucose, Bld: 76 mg/dL (ref 70–99)
Potassium: 4.9 mEq/L (ref 3.5–5.1)
Sodium: 140 mEq/L (ref 135–145)
Total Bilirubin: 0.5 mg/dL (ref 0.2–1.2)
Total Protein: 7 g/dL (ref 6.0–8.3)

## 2021-09-29 LAB — LIPID PANEL
Cholesterol: 147 mg/dL (ref 0–200)
HDL: 36.1 mg/dL — ABNORMAL LOW (ref >39.00)
LDL Cholesterol: 88 mg/dL (ref 0–99)
NonHDL: 111.33
Total CHOL/HDL Ratio: 4
Triglycerides: 119 mg/dL (ref 0.0–149.0)
VLDL: 23.8 mg/dL (ref 0.0–40.0)

## 2021-09-29 LAB — MICROALBUMIN / CREATININE URINE RATIO
Creatinine,U: 102.3 mg/dL
Microalb Creat Ratio: 0.7 mg/g (ref 0.0–30.0)
Microalb, Ur: <0.7 mg/dL (ref 0.0–1.9)

## 2021-09-29 LAB — TSH: TSH: 0.65 u[IU]/mL (ref 0.35–5.50)

## 2021-09-29 NOTE — Progress Notes (Signed)
Subjective:   By signing my name below, I, Shehryar Baig, attest that this documentation has been prepared under the direction and in the presence of Ann Held, DO  09/29/2021    Patient ID: Alex Rocha, male    DOB: 08-04-1955, 66 y.o.   MRN: [id number]  Chief Complaint  Patient presents with   Hypertension   Hyperlipidemia   Diabetes   Follow-up    Hypertension Pertinent negatives include no blurred vision, chest pain, headaches, malaise/fatigue, palpitations or shortness of breath.  Hyperlipidemia Pertinent negatives include no chest pain or shortness of breath.  Diabetes Pertinent negatives for hypoglycemia include no headaches. Pertinent negatives for diabetes include no blurred vision and no chest pain.   Patient is in today for a follow up visit.   His blood pressure is doing well during this visit. He continues taking 100 mg losartan daily PO and reports no new issues while taking it.  BP Readings from Last 3 Encounters:  09/29/21 118/70  08/04/21 (!) 152/64  03/31/21 140/62   Pulse Readings from Last 3 Encounters:  09/29/21 74  08/04/21 68  03/31/21 75   He continues seeing his endocrinologist and reports he is managing his blood sugars well. He was told to raise his a1c by his endocrinologist. He continues taking insulin regularly 2x daily. His insulin was decreased during his last visit. He endocrinologist recently left his practice and he does not have another endocrinologist for a follow up appointment. He reports his brother and both sisters have diabetes.  Lab Results  Component Value Date   HGBA1C 6.0 (A) 08/04/2021   He has an upcomming appointment with his nephrologist this year.  He seen his eye doctor last Wednesday. He was told he needed surgery his cataracts. He has a follow up appointment with a surgeon to discuss his procedure.  He has 4 Covid-19 vaccines at this time. He does not have the bivalent Covid-19 booster vaccine.  He  denies having any new joint pains, swelling in his ankles.    Past Medical History:  Diagnosis Date   Anemia    Cataract    Colon polyps    Diabetes mellitus    Environmental allergies    Hyperlipidemia    Hypertension    Hyperthyroidism    Kidney stones    Low testosterone    Prostate cancer ([place])    Sleep apnea     Past Surgical History:  Procedure Laterality Date   PROSTATE BIOPSY     VASECTOMY      Family History  Problem Relation Age of Onset   Colon cancer Mother    Heart disease Mother    Hypertension Mother        Mother's side of the family   Diabetes Mother        Mother's side of the family   Hyperlipidemia Sister    Hypertension Sister    Diabetes Sister    Heart disease Sister    Osteoarthritis Sister    Diabetes Sister    Colon cancer Brother    Cancer Paternal Aunt        unknown type   Rectal cancer Other    Stomach cancer Other    Heart disease Other    Diabetes Other        father's family   Breast cancer Neg Hx    Pancreatic cancer Neg Hx     Social History   Socioeconomic History   Marital status:  Married    Spouse name: Not on file   Number of children: 2   Years of education: Not on file   Highest education level: Not on file  Occupational History   Occupation: Nurse, adult  Tobacco Use   Smoking status: Never   Smokeless tobacco: Never  Vaping Use   Vaping Use: Never used  Substance and Sexual Activity   Alcohol use: Yes    Comment: rare - on social occasions   Drug use: No   Sexual activity: Yes  Other Topics Concern   Not on file  Social History Narrative   Exercise----not as much--- 1-2 x a week    Social Determinants of Health   Financial Resource Strain: Not on file  Food Insecurity: Not on file  Transportation Needs: Not on file  Physical Activity: Not on file  Stress: Not on file  Social Connections: Not on file  Intimate Partner Violence: Not on file    Outpatient Medications Prior to  Visit  Medication Sig Dispense Refill   BD INSULIN SYRINGE U/F 31G X 5/16" 1 ML MISC 1 EACH BY OTHER ROUTE 2 (TWO) TIMES DAILY. AS DIRECTED 100 each 7   Cinnamon 500 MG capsule Take 500 mg by mouth daily.     co-enzyme Q-10 30 MG capsule Take 30 mg by mouth 3 (three) times daily.     Continuous Blood Gluc Sensor (DEXCOM G6 SENSOR) MISC 1 EACH BY DOES NOT APPLY ROUTE SEE ADMIN INSTRUCTIONS. CHANGE EVERY 10 DAYS 3 each 11   Continuous Blood Gluc Transmit (DEXCOM G6 TRANSMITTER) MISC Use as Directed 1 each 3   diclofenac sodium (VOLTAREN) 1 % GEL APPLY 4 G TOPICALLY 4 (FOUR) TIMES DAILY. 100 g 1   fenofibrate 160 MG tablet TAKE 1 TABLET BY MOUTH EVERY DAY 90 tablet 0   insulin NPH Human (HUMULIN N) 100 UNIT/ML injection 80 units each morning, and 60 units each evening. 140 mL 1   levothyroxine (SYNTHROID) 112 MCG tablet TAKE 1 TABLET BY MOUTH EVERY DAY 30 tablet 0   loratadine (CLARITIN) 10 MG tablet TAKE 1 TABLET BY MOUTH EVERY DAY 90 tablet 1   losartan (COZAAR) 100 MG tablet TAKE 1 TABLET (100 MG TOTAL) BY MOUTH DAILY. *DUE FOR LABS/APPOINTMENT* 30 tablet 0   Omega 3-6-9 Fatty Acids (OMEGA 3-6-9 COMPLEX PO) Take by mouth.     tamsulosin (FLOMAX) 0.4 MG CAPS capsule Take 0.4 mg by mouth daily.     Turmeric 1053 MG TABS Take by mouth.     No facility-administered medications prior to visit.    Allergies  Allergen Reactions   [name] [Yohimbine] Palpitations    Review of Systems  Constitutional:  Negative for fever and malaise/fatigue.  HENT:  Negative for congestion.   Eyes:  Negative for blurred vision.  Respiratory:  Negative for cough and shortness of breath.   Cardiovascular:  Negative for chest pain, palpitations and leg swelling.  Gastrointestinal:  Negative for vomiting.  Musculoskeletal:  Negative for back pain.       (-)new joint pains  Skin:  Negative for rash.  Neurological:  Negative for loss of consciousness and headaches.       Objective:    Physical Exam Vitals  and nursing note reviewed.  Constitutional:      General: He is not in acute distress.    Appearance: Normal appearance. He is not ill-appearing.  HENT:     Head: Normocephalic and atraumatic.     Right Ear: External  ear normal.     Left Ear: External ear normal.  Eyes:     Extraocular Movements: Extraocular movements intact.     Pupils: Pupils are equal, round, and reactive to light.  Cardiovascular:     Rate and Rhythm: Normal rate and regular rhythm.     Heart sounds: Normal heart sounds. No murmur heard.    No gallop.  Pulmonary:     Effort: Pulmonary effort is normal. No respiratory distress.     Breath sounds: Normal breath sounds. No wheezing or rales.  Skin:    General: Skin is warm and dry.  Neurological:     Mental Status: He is alert and oriented to person, place, and time.  Psychiatric:        Judgment: Judgment normal.     BP 118/70 (BP Location: Left Arm, Patient Position: Sitting, Cuff Size: Large)   Pulse 74   Temp 98.6 F (37 C) (Oral)   Resp 18   Ht '6\' 2"'$  (1.88 m)   Wt 289 lb 9.6 oz (131.4 kg)   SpO2 98%   BMI 37.18 kg/m  Wt Readings from Last 3 Encounters:  09/29/21 289 lb 9.6 oz (131.4 kg)  08/04/21 282 lb 6.4 oz (128.1 kg)  03/31/21 287 lb 9.6 oz (130.5 kg)    Diabetic Foot Exam - Simple   No data filed    Lab Results  Component Value Date   WBC 5.9 12/10/2020   HGB 11.2 (L) 12/10/2020   HCT 33.4 (L) 12/10/2020   PLT 256.0 12/10/2020   GLUCOSE 140 (H) 12/10/2020   CHOL 159 12/10/2020   TRIG 130.0 12/10/2020   HDL 36.50 (L) 12/10/2020   LDLDIRECT 107.0 06/10/2020   LDLCALC 97 12/10/2020   ALT 40 12/10/2020   AST 32 12/10/2020   NA 139 12/10/2020   K 4.5 12/10/2020   CL 105 12/10/2020   CREATININE 1.75 (H) 12/10/2020   BUN 47 (H) 12/10/2020   CO2 24 12/10/2020   TSH 1.82 12/10/2020   PSA 5.26 (H) 10/02/2019   HGBA1C 6.0 (A) 08/04/2021   MICROALBUR 1.3 09/14/2020    Lab Results  Component Value Date   TSH 1.82 12/10/2020    Lab Results  Component Value Date   WBC 5.9 12/10/2020   HGB 11.2 (L) 12/10/2020   HCT 33.4 (L) 12/10/2020   MCV 94.8 12/10/2020   PLT 256.0 12/10/2020   Lab Results  Component Value Date   NA 139 12/10/2020   K 4.5 12/10/2020   CO2 24 12/10/2020   GLUCOSE 140 (H) 12/10/2020   BUN 47 (H) 12/10/2020   CREATININE 1.75 (H) 12/10/2020   BILITOT 0.3 12/10/2020   ALKPHOS 36 (L) 12/10/2020   AST 32 12/10/2020   ALT 40 12/10/2020   PROT 7.6 12/10/2020   ALBUMIN 4.6 12/10/2020   CALCIUM 10.1 12/10/2020   ANIONGAP 8 08/01/2019   GFR 41.10 (L) 12/10/2020   Lab Results  Component Value Date   CHOL 159 12/10/2020   Lab Results  Component Value Date   HDL 36.50 (L) 12/10/2020   Lab Results  Component Value Date   LDLCALC 97 12/10/2020   Lab Results  Component Value Date   TRIG 130.0 12/10/2020   Lab Results  Component Value Date   CHOLHDL 4 12/10/2020   Lab Results  Component Value Date   HGBA1C 6.0 (A) 08/04/2021       Assessment & Plan:   Problem List Items Addressed This Visit  Unprioritized   Hypothyroidism following radioiodine therapy   Relevant Orders   Microalbumin / creatinine urine ratio   HTN (hypertension) - Primary   Relevant Orders   Comprehensive metabolic panel   Lipid panel   Microalbumin / creatinine urine ratio   Uncontrolled type 2 diabetes mellitus with hyperglycemia ([facility])    Per endo      Relevant Orders   Microalbumin / creatinine urine ratio   Stage 3b chronic kidney disease ([place])    Check labs  Per nephro      Other Visit Diagnoses     Hyperlipidemia, unspecified hyperlipidemia type       Relevant Orders   Comprehensive metabolic panel   Lipid panel   Hyperlipidemia associated with type 2 diabetes mellitus ([place])       Relevant Orders   Microalbumin / creatinine urine ratio   Hypothyroidism, unspecified type       Relevant Orders   TSH        No orders of the defined types were placed in this  encounter.   IAnn Held, DO, personally preformed the services described in this documentation.  All medical record entries made by the scribe were at my direction and in my presence.  I have reviewed the chart and discharge instructions (if applicable) and agree that the record reflects my personal performance and is accurate and complete. 09/29/2021   I,Shehryar Baig,acting as a Education administrator for [employer], DO.,have documented all relevant documentation on the behalf of Ann Held, DO,as directed by  Ann Held, DO while in the presence of Ann Held, DO.   Ann Held, DO

## 2021-09-29 NOTE — Assessment & Plan Note (Signed)
Check labs  Per nephro

## 2021-09-29 NOTE — Patient Instructions (Signed)

## 2021-09-29 NOTE — Assessment & Plan Note (Signed)
Per endo °

## 2021-10-07 ENCOUNTER — Other Ambulatory Visit: Payer: Self-pay | Admitting: Family Medicine

## 2021-10-10 ENCOUNTER — Other Ambulatory Visit: Payer: Self-pay | Admitting: Family Medicine

## 2021-10-19 LAB — BASIC METABOLIC PANEL
BUN: 73 — AB (ref 4–21)
CO2: 18 (ref 13–22)
Chloride: 106 (ref 99–108)
Creatinine: 3.5 — AB (ref 0.6–1.3)
Glucose: 84
Potassium: 4.9 mEq/L (ref 3.5–5.1)
Sodium: 142 (ref 137–147)

## 2021-10-19 LAB — PROTEIN / CREATININE RATIO, URINE
Albumin, U: 46.9
Creatinine, Urine: 506.5

## 2021-10-19 LAB — COMPREHENSIVE METABOLIC PANEL
Calcium: 9.7 (ref 8.7–10.7)
eGFR: 19

## 2021-10-19 LAB — MICROALBUMIN / CREATININE URINE RATIO: Microalb Creat Ratio: 9

## 2021-10-21 ENCOUNTER — Telehealth: Payer: Self-pay | Admitting: Family Medicine

## 2021-10-21 ENCOUNTER — Other Ambulatory Visit: Payer: Self-pay | Admitting: Family Medicine

## 2021-10-21 DIAGNOSIS — E1165 Type 2 diabetes mellitus with hyperglycemia: Secondary | ICD-10-CM

## 2021-10-21 NOTE — Telephone Encounter (Signed)
Patient states his old endo provider retired and he has been waiting to get a new one. He stated that Dr. Etter Sjogren advised him that if they did not reach out to him with a new endo provider to give our office a call back and let her know. Advised pt to call endo office but he stated that was not what Dr. Etter Sjogren told him to do. Please advise.

## 2021-10-24 ENCOUNTER — Telehealth: Payer: Self-pay

## 2021-10-24 NOTE — Telephone Encounter (Signed)
Nurse Assessment Nurse: Nicki Reaper, RN, Malachy Mood Date/Time (Eastern Time): 10/23/2021 2:09:00 PM Confirm and document reason for call. If symptomatic, describe symptoms. ---Caller states since 10/19/21 he is unable to control his BG. usually his BG runs low and now it is running high unless he does not eat, normally his FBS is around 60, and now his FBS-150 and this morning FBS-148, he walked a mile and half and BG- 132, Current BG-195, has taken Humulin 90 units, and will normally take Humulin 80 every morning and 60u nightly, walks daily and yesterday did not feel well enough to walk, denies other symptoms Does the patient have any new or worsening symptoms? ---Yes Will a triage be completed? ---Yes Related visit to physician within the last 2 weeks? ---No Does the PT have any chronic conditions? (i.e. diabetes, asthma, this includes High risk factors for pregnancy, etc.) ---Yes List chronic conditions. ---DM HTN Is this a behavioral health or substance abuse call? ---No PLEASE NOTE: All timestamps contained within this report are represented as [place] Standard Time. CONFIDENTIALTY NOTICE: This fax transmission is intended only for the addressee. It contains information that is legally privileged, confidential or otherwise protected from use or disclosure. If you are not the intended recipient, you are strictly prohibited from reviewing, disclosing, copying using or disseminating any of this information or taking any action in reliance on or regarding this information. If you have received this fax in error, please notify us immediately by telephone so that we can arrange for its return to [place]. Phone: [phone], Toll-Free: [phone], Fax: [phone] Page: 2 of 2 Call Id: [id number] Guidelines Guideline Title Affirmed Question Affirmed Notes Nurse Date/Time Eilene Ghazi Time) Diabetes - High Blood Sugar Blood glucose 70-240 mg/dL (3.9 -13.3 mmol/L) Nicki Reaper, RN, Malachy Mood 10/23/2021 2:19:00  PM Disp. Time Eilene Ghazi Time) Disposition Final User 10/23/2021 2:23:16 PM See PCP within 24 Hours Yes Nicki Reaper, RN, Malachy Mood Final Disposition 10/23/2021 2:23:16 PM See PCP within 24 Hours Yes Nicki Reaper, RN, Malachy Mood Disposition Overriden: Home Care Override Reason: Patient's symptoms need a higher level of care Caller Disagree/Comply Comply Caller Understands Yes PreDisposition Call Doctor Care Advice Given Per Guideline SEE PCP WITHIN 24 HOURS: * IF OFFICE WILL BE OPEN: You need to be examined within the next 24 hours. Call your doctor (or NP/PA) when the office opens and make an appointment. CALL BACK IF: * Rapid breathing occurs * You become worse * Vomiting occurs Referrals REFERRED TO PCP OFFICE

## 2021-10-24 NOTE — Telephone Encounter (Signed)
Spoke with patient. Pt states sugars were elevated above 145 over the weekend. Pt states he was feeling fatigued and lethargic. At the time of the call pt's blood sugar was 115 and states feeling a little better but not fully. Pt put on with Percell Miller tomorrow afternoon

## 2021-10-25 ENCOUNTER — Ambulatory Visit: Payer: 59 | Admitting: Medical

## 2021-10-25 VITALS — BP 150/60 | HR 69 | Resp 18 | Ht 74.0 in | Wt 291.0 lb

## 2021-10-25 DIAGNOSIS — E1165 Type 2 diabetes mellitus with hyperglycemia: Secondary | ICD-10-CM

## 2021-10-25 DIAGNOSIS — I10 Essential (primary) hypertension: Secondary | ICD-10-CM | POA: Diagnosis not present

## 2021-10-25 DIAGNOSIS — R5383 Other fatigue: Secondary | ICD-10-CM | POA: Diagnosis not present

## 2021-10-25 LAB — GLUCOSE, POCT (MANUAL RESULT ENTRY): POC Glucose: 75 mg/dl (ref 70–99)

## 2021-10-25 NOTE — Patient Instructions (Addendum)
Diabetes- with recent higher than normal sugar levels. Skipped lunch today for office visit and sugars in 60 range. Snack given during office visit. Will get a1c. Sugar level did come up to 76.  On review no infectious described or found on exam.  For fatigue will get cbc, cmp, tsh, t4, iron, b12 and b1.   Will need to follow a1c and check average sugar. On humalin 80 units in am and 60 units at night.  Ckd- will follow gfr and cr.  Rare low sugar events. Discussed how to bring up quickly and have snack every 4 hours between your 3 meals a day.  Htn- food level today. Stay on losartan 100 mg daily.   Follow up in 10-14 days or sooner if needed

## 2021-10-25 NOTE — Progress Notes (Signed)
Subjective:    Patient ID: Alex Rocha, male    DOB: 1956-04-17, 66 y.o.   MRN: [id number]  HPI  Pt in for complaints wed, Thursday, Friday, Saturday and part of Sunday sugar was 120-150.   He states all of those he was checking his sugar various time of the day. He has dexcom.   After he was eating he would notice some sugars above 200.  He states has been feeling fatigued. He states seems worse days sugars were running higher.  Pt states sugars got higher on July 5.  Pt states one day his bp 92/56. He only checked one time.  Pt states he told nephrologist at [facility] told him to cut losartan to 50 mg daily. Did that for couple of days he saw bp go up above his usual reading so he went back to 100 mg daily.   Describes moderate healthy diet but not real strict diet.  In office his machine gave reading of 58. Pt has skipped lunch  Pt is former pt of Dr. Loanne Drilling. Pt has not gotten appointment yet. Pt has been calling endocrinologist office on Monday. He has not called back yet.     Review of Systems  Constitutional:  Negative for chills, fatigue and fever.  HENT:  Negative for congestion, ear pain and sore throat.   Respiratory:  Negative for cough, chest tightness and shortness of breath.   Cardiovascular:  Negative for chest pain and palpitations.  Gastrointestinal:  Negative for abdominal pain.  Genitourinary:  Negative for dysuria, frequency, penile pain, testicular pain and urgency.       Baseline chronic frequent urination.not worse than baseline  Musculoskeletal:  Negative for back pain, joint swelling and myalgias.  Skin:  Negative for rash.  Neurological:  Negative for dizziness, numbness and headaches.  Hematological:  Negative for adenopathy.  Psychiatric/Behavioral:  Negative for behavioral problems and confusion.    Past Medical History:  Diagnosis Date   Anemia    Cataract    Colon polyps    Diabetes mellitus    Environmental allergies     Hyperlipidemia    Hypertension    Hyperthyroidism    Kidney stones    Low testosterone    Prostate cancer ([place])    Sleep apnea      Social History   Socioeconomic History   Marital status: Married    Spouse name: Not on file   Number of children: 2   Years of education: Not on file   Highest education level: Not on file  Occupational History   Occupation: Nurse, adult  Tobacco Use   Smoking status: Never   Smokeless tobacco: Never  Vaping Use   Vaping Use: Never used  Substance and Sexual Activity   Alcohol use: Yes    Comment: rare - on social occasions   Drug use: No   Sexual activity: Yes  Other Topics Concern   Not on file  Social History Narrative   Exercise----not as much--- 1-2 x a week    Social Determinants of Health   Financial Resource Strain: Not on file  Food Insecurity: Not on file  Transportation Needs: Not on file  Physical Activity: Not on file  Stress: Not on file  Social Connections: Not on file  Intimate Partner Violence: Not on file    Past Surgical History:  Procedure Laterality Date   PROSTATE BIOPSY     VASECTOMY      Family History  Problem Relation  Age of Onset   Colon cancer Mother    Heart disease Mother    Hypertension Mother        Mother's side of the family   Diabetes Mother        Mother's side of the family   Hyperlipidemia Sister    Hypertension Sister    Diabetes Sister    Heart disease Sister    Osteoarthritis Sister    Diabetes Sister    Colon cancer Brother    Cancer Paternal Aunt        unknown type   Rectal cancer Other    Stomach cancer Other    Heart disease Other    Diabetes Other        father's family   Breast cancer Neg Hx    Pancreatic cancer Neg Hx     Allergies  Allergen Reactions   [name] [Yohimbine] Palpitations    Current Outpatient Medications on File Prior to Visit  Medication Sig Dispense Refill   BD INSULIN SYRINGE U/F 31G X 5/16" 1 ML MISC 1 EACH BY OTHER ROUTE 2  (TWO) TIMES DAILY. AS DIRECTED 100 each 7   Cinnamon 500 MG capsule Take 500 mg by mouth daily.     co-enzyme Q-10 30 MG capsule Take 30 mg by mouth 3 (three) times daily.     Continuous Blood Gluc Sensor (DEXCOM G6 SENSOR) MISC 1 EACH BY DOES NOT APPLY ROUTE SEE ADMIN INSTRUCTIONS. CHANGE EVERY 10 DAYS 3 each 11   Continuous Blood Gluc Transmit (DEXCOM G6 TRANSMITTER) MISC Use as Directed 1 each 3   diclofenac sodium (VOLTAREN) 1 % GEL APPLY 4 G TOPICALLY 4 (FOUR) TIMES DAILY. 100 g 1   fenofibrate 160 MG tablet TAKE 1 TABLET BY MOUTH EVERY DAY 30 tablet 2   insulin NPH Human (HUMULIN N) 100 UNIT/ML injection 80 units each morning, and 60 units each evening. 140 mL 1   levothyroxine (SYNTHROID) 112 MCG tablet TAKE 1 TABLET BY MOUTH EVERY DAY 30 tablet 0   loratadine (CLARITIN) 10 MG tablet TAKE 1 TABLET BY MOUTH EVERY DAY 90 tablet 1   losartan (COZAAR) 100 MG tablet Take 1 tablet (100 mg total) by mouth daily. 90 tablet 1   Omega 3-6-9 Fatty Acids (OMEGA 3-6-9 COMPLEX PO) Take by mouth.     tamsulosin (FLOMAX) 0.4 MG CAPS capsule Take 0.4 mg by mouth daily.     Turmeric 1053 MG TABS Take by mouth.     No current facility-administered medications on file prior to visit.    BP (!) 150/60   Pulse 69   Resp 18   Ht '6\' 2"'$  (1.88 m)   Wt 291 lb (132 kg)   SpO2 98%   BMI 37.36 kg/m        Objective:   Physical Exam General Mental Status- Alert. General Appearance- Not in acute distress.   Skin General: Color- Normal Color. Moisture- Normal Moisture.  Neck Carotid Arteries- Normal color. Moisture- Normal Moisture. No carotid bruits. No JVD.  Chest and Lung Exam Auscultation: Breath Sounds:-Normal.  Cardiovascular Auscultation:Rythm- Regular. Murmurs & Other Heart Sounds:Auscultation of the heart reveals- No Murmurs.  Abdomen Inspection:-Inspeection Normal. Palpation/Percussion:Note:No mass. Palpation and Percussion of the abdomen reveal- Non Tender, Non Distended + BS, no  rebound or guarding.   Neurologic Cranial Nerve exam:- CN III-XII intact(No nystagmus), symmetric smile. Strength:- 5/5 equal and symmetric strength both upper and lower extremities.        Assessment & Plan:  Patient Instructions  Diabetes- with recent higher than normal sugar levels. Skipped lunch today for office visit and sugars in 60 range. Snack given during office visit. Will get a1c.   On review no infectious described or found on exam.  For fatigue will get cbc, cmp, tsh, t4, iron, b12 and b1.   Will need to follow a1c and check average sugar. On humalin 80 units in am and 60 units at night.  Ckd- will follow gfr and cr.  Rare low sugar events. Discussed how to bring up quickly and have snack every 4 hours between your 3 meals a day.  Htn- food level today. Stay on losartan 100 mg daily.   Follow up in 10-14 days or sooner if needed                Mackie Pai, PA-C   Time spent with patient today was 42  minutes which consisted of chart revdiew, discussing diagnosis, work up treatment and documentation.

## 2021-10-26 LAB — COMPREHENSIVE METABOLIC PANEL
ALT: 32 U/L (ref 0–53)
AST: 32 U/L (ref 0–37)
Albumin: 4.5 g/dL (ref 3.5–5.2)
Alkaline Phosphatase: 28 U/L — ABNORMAL LOW (ref 39–117)
BUN: 44 mg/dL — ABNORMAL HIGH (ref 6–23)
CO2: 25 mEq/L (ref 19–32)
Calcium: 9.4 mg/dL (ref 8.4–10.5)
Chloride: 107 mEq/L (ref 96–112)
Creatinine, Ser: 1.71 mg/dL — ABNORMAL HIGH (ref 0.40–1.50)
GFR: 41.99 mL/min — ABNORMAL LOW (ref >60.00)
Glucose, Bld: 93 mg/dL (ref 70–99)
Potassium: 5.2 mEq/L — ABNORMAL HIGH (ref 3.5–5.1)
Sodium: 137 mEq/L (ref 135–145)
Total Bilirubin: 0.4 mg/dL (ref 0.2–1.2)
Total Protein: 7.1 g/dL (ref 6.0–8.3)

## 2021-10-26 LAB — CBC WITH DIFFERENTIAL/PLATELET
Basophils Absolute: 0.1 10*3/uL (ref 0.0–0.1)
Basophils Relative: 1.4 % (ref 0.0–3.0)
Eosinophils Absolute: 0.2 10*3/uL (ref 0.0–0.7)
Eosinophils Relative: 4.1 % (ref 0.0–5.0)
HCT: 33.2 % — ABNORMAL LOW (ref 39.0–52.0)
Hemoglobin: 11.1 g/dL — ABNORMAL LOW (ref 13.0–17.0)
Lymphocytes Relative: 33.5 % (ref 12.0–46.0)
Lymphs Abs: 1.8 10*3/uL (ref 0.7–4.0)
MCHC: 33.5 g/dL (ref 30.0–36.0)
MCV: 96.2 fl (ref 78.0–100.0)
Monocytes Absolute: 0.6 10*3/uL (ref 0.1–1.0)
Monocytes Relative: 10.3 % (ref 3.0–12.0)
Neutro Abs: 2.7 10*3/uL (ref 1.4–7.7)
Neutrophils Relative %: 50.7 % (ref 43.0–77.0)
Platelets: 240 10*3/uL (ref 150.0–400.0)
RBC: 3.46 Mil/uL — ABNORMAL LOW (ref 4.22–5.81)
RDW: 13.5 % (ref 11.5–15.5)
WBC: 5.4 10*3/uL (ref 4.0–10.5)

## 2021-10-26 LAB — VITAMIN B12: Vitamin B-12: 1487 pg/mL — ABNORMAL HIGH (ref 211–911)

## 2021-10-26 LAB — HEMOGLOBIN A1C: Hgb A1c MFr Bld: 6.4 % (ref 4.6–6.5)

## 2021-10-26 LAB — T4, FREE: Free T4: 0.82 ng/dL (ref 0.60–1.60)

## 2021-10-26 LAB — TSH: TSH: 1.59 u[IU]/mL (ref 0.35–5.50)

## 2021-10-26 LAB — IRON: Iron: 66 ug/dL (ref 42–165)

## 2021-10-28 ENCOUNTER — Other Ambulatory Visit: Payer: Self-pay | Admitting: Family Medicine

## 2021-10-29 LAB — VITAMIN B1: Vitamin B1 (Thiamine): 11 nmol/L (ref 8–30)

## 2021-11-03 ENCOUNTER — Encounter: Payer: Self-pay | Admitting: *Deleted

## 2021-11-15 HISTORY — PX: CATARACT EXTRACTION: SUR2

## 2021-11-17 ENCOUNTER — Other Ambulatory Visit: Payer: Self-pay | Admitting: Family Medicine

## 2021-11-17 DIAGNOSIS — J069 Acute upper respiratory infection, unspecified: Secondary | ICD-10-CM

## 2021-11-23 ENCOUNTER — Other Ambulatory Visit: Payer: Self-pay | Admitting: Family Medicine

## 2021-12-16 ENCOUNTER — Encounter: Payer: Self-pay | Admitting: Primary Care

## 2021-12-16 ENCOUNTER — Ambulatory Visit: Payer: 59 | Admitting: Primary Care

## 2021-12-16 VITALS — BP 132/76 | HR 82 | Temp 98.2°F | Ht 74.0 in | Wt 288.8 lb

## 2021-12-16 DIAGNOSIS — G4733 Obstructive sleep apnea (adult) (pediatric): Secondary | ICD-10-CM

## 2021-12-16 DIAGNOSIS — G4730 Sleep apnea, unspecified: Secondary | ICD-10-CM

## 2021-12-16 NOTE — Progress Notes (Signed)
$'@Patient'q$  ID: Alex Rocha, male    DOB: May 11, 1955, 66 y.o.   MRN: [id number]  Chief Complaint  Patient presents with   Follow-up    Needs new machine. Machine is making loud noise. Would like to switch DME companies.     Referring provider: Ann Held, *  HPI: 66 year old male, never smoked. PMH severe OSA, hypertension, DM type 2, sinusitis, GERD. Patient of Dr. Mortimer Fries, last seen on 02/07/21. Home sleep study 12/28/15 >> AHI 86.5, SaO2 low 66.   12/16/2021 Patient presents today for fu OSA. He feels his current CPAP machine is not working properly. Machine has been making loud noise as pressure ramps up the last week or two . He called his insurance company and was told that he was eligible for a new machine. He also would like to change DME companies. He is currently with [facility]. Airview download shows 100% compliance, average usage 6 hours 19 mins. Current pressure 10-18cm h20, residual AHI 2.4/hr.   Airview download 11/16/21-12/15/21 30/30 days used; 93% > 4 hours Average usage 6 hours 19 minutes Pressure 10 to 18 cm H2O (17.8cm h20-95%) Airleaks 29.6L/min (95%) AHI 2.4  Allergies  Allergen Reactions   [name] [Yohimbine] Palpitations    Immunization History  Administered Date(s) Administered   Influenza,inj,Quad PF,6+ Mos 04/28/2013, 01/26/2014, 12/11/2014, 02/04/2016, 01/07/2018, 12/31/2018   Influenza-Unspecified 02/12/2020   Moderna SARS-COV2 Booster Vaccination 09/03/2020   Moderna Sars-Covid-2 Vaccination 04/28/2019, 05/26/2019, 02/17/2020   PNEUMOCOCCAL CONJUGATE-20 12/10/2020   Pneumococcal Polysaccharide-23 01/11/2015   Tdap 01/11/2015   Zoster Recombinat (Shingrix) 12/04/2017, 02/05/2018    Past Medical History:  Diagnosis Date   Anemia    Cataract    Colon polyps    Diabetes mellitus    Environmental allergies    Hyperlipidemia    Hypertension    Hyperthyroidism    Kidney stones    Low testosterone    Prostate cancer ([place])    Sleep apnea      Tobacco History: Social History   Tobacco Use  Smoking Status Never  Smokeless Tobacco Never   Counseling given: Not Answered   Outpatient Medications Prior to Visit  Medication Sig Dispense Refill   BD INSULIN SYRINGE U/F 31G X 5/16" 1 ML MISC 1 EACH BY OTHER ROUTE 2 (TWO) TIMES DAILY. AS DIRECTED 100 each 7   Cinnamon 500 MG capsule Take 500 mg by mouth daily.     co-enzyme Q-10 30 MG capsule Take 30 mg by mouth 3 (three) times daily.     Continuous Blood Gluc Sensor (DEXCOM G6 SENSOR) MISC 1 EACH BY DOES NOT APPLY ROUTE SEE ADMIN INSTRUCTIONS. CHANGE EVERY 10 DAYS 3 each 11   Continuous Blood Gluc Transmit (DEXCOM G6 TRANSMITTER) MISC Use as Directed 1 each 3   diclofenac sodium (VOLTAREN) 1 % GEL APPLY 4 G TOPICALLY 4 (FOUR) TIMES DAILY. 100 g 1   fenofibrate 160 MG tablet TAKE 1 TABLET BY MOUTH EVERY DAY 30 tablet 2   insulin NPH Human (HUMULIN N) 100 UNIT/ML injection 80 units each morning, and 60 units each evening. 140 mL 1   levothyroxine (SYNTHROID) 112 MCG tablet TAKE 1 TABLET BY MOUTH EVERY DAY 90 tablet 1   loratadine (CLARITIN) 10 MG tablet TAKE 1 TABLET BY MOUTH EVERY DAY 90 tablet 1   losartan (COZAAR) 100 MG tablet Take 1 tablet (100 mg total) by mouth daily. 90 tablet 1   Omega 3-6-9 Fatty Acids (OMEGA 3-6-9 COMPLEX PO) Take by  mouth.     tamsulosin (FLOMAX) 0.4 MG CAPS capsule Take 0.4 mg by mouth daily.     Turmeric 1053 MG TABS Take by mouth.     No facility-administered medications prior to visit.    Review of Systems  Review of Systems  Constitutional: Negative.   HENT: Negative.    Respiratory: Negative.    Cardiovascular: Negative.      Physical Exam  BP 132/76 (BP Location: Left Arm, Cuff Size: Large)   Pulse 82   Temp 98.2 F (36.8 C) (Temporal)   Ht '6\' 2"'$  (1.88 m)   Wt 288 lb 12.8 oz (131 kg)   SpO2 100%   BMI 37.08 kg/m  Physical Exam Constitutional:      Appearance: Normal appearance.  Cardiovascular:     Rate and Rhythm:  Normal rate.  Pulmonary:     Effort: Pulmonary effort is normal.  Musculoskeletal:        General: Normal range of motion.  Skin:    General: Skin is warm and dry.  Neurological:     General: No focal deficit present.     Mental Status: He is alert and oriented to person, place, and time. Mental status is at baseline.  Psychiatric:        Mood and Affect: Mood normal.        Behavior: Behavior normal.        Thought Content: Thought content normal.        Judgment: Judgment normal.      Lab Results:  CBC    Component Value Date/Time   WBC 5.4 10/25/2021 1421   RBC 3.46 (L) 10/25/2021 1421   HGB 11.1 (L) 10/25/2021 1421   HCT 33.2 (L) 10/25/2021 1421   PLT 240.0 10/25/2021 1421   MCV 96.2 10/25/2021 1421   MCH 31.1 08/01/2019 1831   MCHC 33.5 10/25/2021 1421   RDW 13.5 10/25/2021 1421   LYMPHSABS 1.8 10/25/2021 1421   MONOABS 0.6 10/25/2021 1421   EOSABS 0.2 10/25/2021 1421   BASOSABS 0.1 10/25/2021 1421    BMET    Component Value Date/Time   NA 137 10/25/2021 1421   NA 142 10/19/2021 0000   K 5.2 No hemolysis seen (H) 10/25/2021 1421   CL 107 10/25/2021 1421   CO2 25 10/25/2021 1421   GLUCOSE 93 10/25/2021 1421   BUN 44 (H) 10/25/2021 1421   BUN 73 (A) 10/19/2021 0000   CREATININE 1.71 (H) 10/25/2021 1421   CREATININE 1.66 (H) 07/05/2018 1607   CALCIUM 9.4 10/25/2021 1421   GFRNONAA 37 (L) 08/01/2019 1831   GFRAA 43 (L) 08/01/2019 1831    BNP No results found for: "BNP"  ProBNP No results found for: "PROBNP"  Imaging: No results found.   Assessment & Plan:   Sleep apnea - Home sleep study on 12/28/2015 showed severe obstructive sleep apnea, AHI 86.5 an hour.  Patient has been maintained on auto CPAP.  He is having issues with CPAP machine making a loud noise as pressure ramps up.  Current pressure is 10 -18 cm H2O/ Residual AHI 2.4. He is having a moderate amt of airleaks.  Recommend we try lowering pressure settings.  If this does not help with  noise recommend patient call [facility] to see if he can get a rental or loaner CPAP machine while current one is being serviced.  Last option would be for [place] to place an order for patient to be provided new CPAP machine with a different DME  company.  We will first try changing auto CPAP pressure setting to 8 - 16 cm H2O. He will update [place] next week on results and we can proceed with next steps if needed.      Martyn Ehrich, NP 12/16/2021

## 2021-12-16 NOTE — Patient Instructions (Addendum)
Recommendations: Option 1 is to lower current CPAP pressure settings to see if that helps with noise occurring with ramp up  Option 2 is to check with [facility] to see if there is a rental or loaner CPAP option while current one is getting repaired Option 3 is to place an order for a new CPAP machine with a different DME company  Orders: Adjust CPAP pressure 8-16cm h20  Follow-up: Please keep me updated on pressure setting changes and if you need a order placed for new CPAP machine with a different DME company such as [facility] or [facility]

## 2021-12-16 NOTE — Assessment & Plan Note (Signed)
-   Home sleep study on 12/28/2015 showed severe obstructive sleep apnea, AHI 86.5 an hour.  Patient has been maintained on auto CPAP.  He is having issues with CPAP machine making a loud noise as pressure ramps up.  Current pressure is 10 -18 cm H2O/ Residual AHI 2.4. He is having a moderate amt of airleaks.  Recommend we try lowering pressure settings.  If this does not help with noise recommend patient call [facility] to see if he can get a rental or loaner CPAP machine while current one is being serviced.  Last option would be for [place] to place an order for patient to be provided new CPAP machine with a different DME company.  We will first try changing auto CPAP pressure setting to 8 - 16 cm H2O. He will update [place] next week on results and we can proceed with next steps if needed.

## 2022-01-07 ENCOUNTER — Other Ambulatory Visit: Payer: Self-pay | Admitting: Family Medicine

## 2022-01-16 ENCOUNTER — Other Ambulatory Visit: Payer: 59

## 2022-01-18 ENCOUNTER — Ambulatory Visit: Payer: 59 | Admitting: Endocrinology

## 2022-03-06 ENCOUNTER — Other Ambulatory Visit: Payer: Self-pay | Admitting: Family Medicine

## 2022-03-31 ENCOUNTER — Encounter: Payer: Self-pay | Admitting: Family Medicine

## 2022-03-31 ENCOUNTER — Ambulatory Visit (INDEPENDENT_AMBULATORY_CARE_PROVIDER_SITE_OTHER): Payer: 59 | Admitting: Family Medicine

## 2022-03-31 VITALS — BP 134/68 | HR 73 | Temp 98.7°F | Resp 18 | Ht 74.0 in | Wt 291.0 lb

## 2022-03-31 DIAGNOSIS — E785 Hyperlipidemia, unspecified: Secondary | ICD-10-CM | POA: Diagnosis not present

## 2022-03-31 DIAGNOSIS — N4232 Atypical small acinar proliferation of prostate: Secondary | ICD-10-CM

## 2022-03-31 DIAGNOSIS — N1832 Chronic kidney disease, stage 3b: Secondary | ICD-10-CM | POA: Diagnosis not present

## 2022-03-31 DIAGNOSIS — E119 Type 2 diabetes mellitus without complications: Secondary | ICD-10-CM | POA: Diagnosis not present

## 2022-03-31 DIAGNOSIS — Z0000 Encounter for general adult medical examination without abnormal findings: Secondary | ICD-10-CM

## 2022-03-31 DIAGNOSIS — E890 Postprocedural hypothyroidism: Secondary | ICD-10-CM | POA: Diagnosis not present

## 2022-03-31 DIAGNOSIS — E1169 Type 2 diabetes mellitus with other specified complication: Secondary | ICD-10-CM

## 2022-03-31 DIAGNOSIS — R350 Frequency of micturition: Secondary | ICD-10-CM

## 2022-03-31 DIAGNOSIS — G4733 Obstructive sleep apnea (adult) (pediatric): Secondary | ICD-10-CM

## 2022-03-31 DIAGNOSIS — Z0001 Encounter for general adult medical examination with abnormal findings: Secondary | ICD-10-CM

## 2022-03-31 DIAGNOSIS — C61 Malignant neoplasm of prostate: Secondary | ICD-10-CM

## 2022-03-31 DIAGNOSIS — R413 Other amnesia: Secondary | ICD-10-CM

## 2022-03-31 DIAGNOSIS — I10 Essential (primary) hypertension: Secondary | ICD-10-CM

## 2022-03-31 LAB — CBC WITH DIFFERENTIAL/PLATELET
Basophils Absolute: 0 10*3/uL (ref 0.0–0.1)
Basophils Relative: 0.8 % (ref 0.0–3.0)
Eosinophils Absolute: 0.2 10*3/uL (ref 0.0–0.7)
Eosinophils Relative: 3.4 % (ref 0.0–5.0)
HCT: 35.3 % — ABNORMAL LOW (ref 39.0–52.0)
Hemoglobin: 11.9 g/dL — ABNORMAL LOW (ref 13.0–17.0)
Lymphocytes Relative: 35.4 % (ref 12.0–46.0)
Lymphs Abs: 2 10*3/uL (ref 0.7–4.0)
MCHC: 33.7 g/dL (ref 30.0–36.0)
MCV: 94 fl (ref 78.0–100.0)
Monocytes Absolute: 0.5 10*3/uL (ref 0.1–1.0)
Monocytes Relative: 9 % (ref 3.0–12.0)
Neutro Abs: 2.9 10*3/uL (ref 1.4–7.7)
Neutrophils Relative %: 51.4 % (ref 43.0–77.0)
Platelets: 252 10*3/uL (ref 150.0–400.0)
RBC: 3.76 Mil/uL — ABNORMAL LOW (ref 4.22–5.81)
RDW: 13.1 % (ref 11.5–15.5)
WBC: 5.6 10*3/uL (ref 4.0–10.5)

## 2022-03-31 LAB — LIPID PANEL
Cholesterol: 168 mg/dL (ref 0–200)
HDL: 42.8 mg/dL (ref >39.00)
LDL Cholesterol: 105 mg/dL — ABNORMAL HIGH (ref 0–99)
NonHDL: 125.46
Total CHOL/HDL Ratio: 4
Triglycerides: 101 mg/dL (ref 0.0–149.0)
VLDL: 20.2 mg/dL (ref 0.0–40.0)

## 2022-03-31 LAB — MICROALBUMIN / CREATININE URINE RATIO
Creatinine,U: 80.9 mg/dL
Microalb Creat Ratio: 0.9 mg/g (ref 0.0–30.0)
Microalb, Ur: <0.7 mg/dL (ref 0.0–1.9)

## 2022-03-31 LAB — COMPREHENSIVE METABOLIC PANEL
ALT: 28 U/L (ref 0–53)
AST: 26 U/L (ref 0–37)
Albumin: 4.6 g/dL (ref 3.5–5.2)
Alkaline Phosphatase: 35 U/L — ABNORMAL LOW (ref 39–117)
BUN: 42 mg/dL — ABNORMAL HIGH (ref 6–23)
CO2: 26 mEq/L (ref 19–32)
Calcium: 10.1 mg/dL (ref 8.4–10.5)
Chloride: 106 mEq/L (ref 96–112)
Creatinine, Ser: 1.65 mg/dL — ABNORMAL HIGH (ref 0.40–1.50)
GFR: 43.7 mL/min — ABNORMAL LOW (ref >60.00)
Glucose, Bld: 110 mg/dL — ABNORMAL HIGH (ref 70–99)
Potassium: 5 mEq/L (ref 3.5–5.1)
Sodium: 140 mEq/L (ref 135–145)
Total Bilirubin: 0.4 mg/dL (ref 0.2–1.2)
Total Protein: 7.4 g/dL (ref 6.0–8.3)

## 2022-03-31 LAB — HEMOGLOBIN A1C: Hgb A1c MFr Bld: 6.8 % — ABNORMAL HIGH (ref 4.6–6.5)

## 2022-03-31 LAB — TSH: TSH: 1.89 u[IU]/mL (ref 0.35–5.50)

## 2022-03-31 LAB — PSA: PSA: 3.91 ng/mL (ref 0.10–4.00)

## 2022-03-31 MED ORDER — INSULIN NPH (HUMAN) (ISOPHANE) 100 UNIT/ML ~~LOC~~ SUSP: SUBCUTANEOUS | 1 refills | Status: DC

## 2022-03-31 NOTE — Patient Instructions (Signed)

## 2022-03-31 NOTE — Progress Notes (Unsigned)
Established Patient Office Visit  Subjective   Patient ID: Alex Rocha, male    DOB: [birth year]  Age: 66 y.o. MRN: [id number]  Chief Complaint  Patient presents with   Annual Exam    Pt states fasting     HPI Pt here for cpe and f/u dm, chol and bp.   He also c/o R knee pain that has worsened over the last few months.   He also c/o memory loss--- he has gotten lost driving and will forget other things ---  his family has noticed as well.    No other complaints   Patient Active Problem List   Diagnosis Date Noted   Preventative health care 12/10/2020   Anemia 10/19/2020   Malignant neoplasm of prostate ([place]) 05/11/2020   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   Uncontrolled type 2 diabetes mellitus with hyperglycemia ([place]) 10/02/2019   Urinary frequency 10/02/2019   Chills (without fever) 10/02/2019   Dysuria 09/19/2019   Stage 3b chronic kidney disease ([place]) 09/19/2019   Urinary tract infection without hematuria 08/08/2019   ETD (eustachian tube dysfunction) 06/08/2018   Acute left-sided low back pain with left-sided sciatica 05/22/2017   Tingling in extremities 08/14/2016   Arthralgia 08/14/2016   Arthritis 05/29/2016   De Quervain's tenosynovitis, right 05/29/2016   Sleep apnea 12/06/2015   Type 2 diabetes mellitus without complication, without long-term current use of insulin ([place]) 06/19/2015   Light headedness 01/11/2015   Gastroesophageal reflux disease without esophagitis 02/13/2014   Gout 01/27/2014   Hypothyroidism following radioiodine therapy 12/08/2013   Sinusitis 08/25/2013   Multinodular goiter 01/22/2013   HTN (hypertension) 11/25/2012   Hyperlipidemia LDL goal <70 11/25/2012   Left otitis media 11/25/2012   Low testosterone 11/25/2012   Past Medical History:  Diagnosis Date   Anemia    Cataract    Colon polyps    Diabetes mellitus    Environmental allergies    Hyperlipidemia    Hypertension    Hyperthyroidism    Kidney  stones    Low testosterone    Prostate cancer ([place])    Sleep apnea    Past Surgical History:  Procedure Laterality Date   CATARACT EXTRACTION Left 09/2021   CATARACT EXTRACTION Right 11/2021   PROSTATE BIOPSY     VASECTOMY     Social History   Tobacco Use   Smoking status: Never   Smokeless tobacco: Never  Vaping Use   Vaping Use: Never used  Substance Use Topics   Alcohol use: Yes    Comment: rare - on social occasions   Drug use: No   Social History   Socioeconomic History   Marital status: Married    Spouse name: Not on file   Number of children: 2   Years of education: Not on file   Highest education level: Not on file  Occupational History   Occupation: Nurse, adult  Tobacco Use   Smoking status: Never   Smokeless tobacco: Never  Vaping Use   Vaping Use: Never used  Substance and Sexual Activity   Alcohol use: Yes    Comment: rare - on social occasions   Drug use: No   Sexual activity: Yes  Other Topics Concern   Not on file  Social History Narrative   Exercise----not as much--- 1-2 x a week    Social Determinants of Health   Financial Resource Strain: Not on file  Food Insecurity: Not on file  Transportation Needs: Not on file  Physical Activity: Not on file  Stress: Not on file  Social Connections: Not on file  Intimate Partner Violence: Not on file   Family Status  Relation Name Status   Mother  Deceased at age 55       chf   Father  Deceased at age 80   Sister deb Alive   Sister  74   Brother  Chief Financial Officer  (Not Specified)   Other  (Not Specified)   Neg Hx  (Not Specified)   Family History  Problem Relation Age of Onset   Colon cancer Mother    Heart disease Mother    Hypertension Mother        Mother's side of the family   Diabetes Mother        Mother's side of the family   Hyperlipidemia Sister    Hypertension Sister    Diabetes Sister    Heart disease Sister    Osteoarthritis Sister    Diabetes Sister     Colon cancer Brother    Cancer Paternal Aunt        unknown type   Rectal cancer Other    Stomach cancer Other    Heart disease Other    Diabetes Other        father's family   Breast cancer Neg Hx    Pancreatic cancer Neg Hx    Allergies  Allergen Reactions   [name] [Yohimbine] Palpitations      Review of Systems  Constitutional:  Negative for chills, fever and malaise/fatigue.  HENT:  Negative for congestion and hearing loss.   Eyes:  Negative for discharge.  Respiratory:  Negative for cough, sputum production and shortness of breath.   Cardiovascular:  Negative for chest pain, palpitations and leg swelling.  Gastrointestinal:  Negative for abdominal pain, blood in stool, constipation, diarrhea, heartburn, nausea and vomiting.  Genitourinary:  Negative for dysuria, frequency, hematuria and urgency.  Musculoskeletal:  Negative for back pain, falls and myalgias.  Skin:  Negative for rash.  Neurological:  Negative for dizziness, sensory change, loss of consciousness, weakness and headaches.  Endo/Heme/Allergies:  Negative for environmental allergies. Does not bruise/bleed easily.  Psychiatric/Behavioral:  Negative for depression and suicidal ideas. The patient is not nervous/anxious and does not have insomnia.       Objective:     BP 134/68 (BP Location: Left Arm, Patient Position: Sitting, Cuff Size: Large)   Pulse 73   Temp 98.7 F (37.1 C) (Oral)   Resp 18   Ht _0  (1.88 m)   Wt 291 lb (132 kg)   SpO2 97%   BMI 37.36 kg/m  BP Readings from Last 3 Encounters:  03/31/22 134/68  12/16/21 132/76  10/25/21 (!) 150/60   Wt Readings from Last 3 Encounters:  03/31/22 291 lb (132 kg)  12/16/21 288 lb 12.8 oz (131 kg)  10/25/21 291 lb (132 kg)   SpO2 Readings from Last 3 Encounters:  03/31/22 97%  12/16/21 100%  10/25/21 98%      Physical Exam Vitals and nursing note reviewed. Exam conducted with a chaperone present.  Constitutional:      General: He is  not in acute distress.    Appearance: He is well-developed. He is not diaphoretic.  HENT:     Head: Normocephalic and atraumatic.     Right Ear: External ear normal.     Left Ear: External ear normal.     Nose: Nose normal.  Mouth/Throat:     Pharynx: No oropharyngeal exudate.  Eyes:     General:        Right eye: No discharge.        Left eye: No discharge.     Conjunctiva/sclera: Conjunctivae normal.     Pupils: Pupils are equal, round, and reactive to light.  Neck:     Thyroid: No thyromegaly.     Vascular: No JVD.  Cardiovascular:     Rate and Rhythm: Normal rate and regular rhythm.     Heart sounds: No murmur heard.    No friction rub. No gallop.  Pulmonary:     Effort: Pulmonary effort is normal. No respiratory distress.     Breath sounds: Normal breath sounds. No wheezing or rales.  Chest:     Chest wall: No tenderness.  Abdominal:     General: Bowel sounds are normal. There is no distension.     Palpations: Abdomen is soft. There is no mass.     Tenderness: There is no abdominal tenderness. There is no guarding or rebound.  Musculoskeletal:        General: Normal range of motion.     Cervical back: Normal range of motion and neck supple.     Right hip: Tenderness present. Normal range of motion. Normal strength.     Left hip: Tenderness present. Normal range of motion. Normal strength.     Right foot: Bony tenderness present. No swelling.     Left foot: Bony tenderness present. No swelling.  Lymphadenopathy:     Cervical: No cervical adenopathy.  Skin:    General: Skin is warm and dry.     Coloration: Skin is not pale.     Findings: No erythema or rash.  Neurological:     General: No focal deficit present.     Mental Status: He is alert and oriented to person, place, and time.     Motor: No abnormal muscle tone.     Deep Tendon Reflexes: Reflexes are normal and symmetric. Reflexes normal.  Psychiatric:        Mood and Affect: Mood normal.        Behavior:  Behavior normal.        Thought Content: Thought content normal.        Judgment: Judgment normal.      Results for orders placed or performed in visit on 03/31/22  Lipid panel  Result Value Ref Range   Cholesterol 168 0 - 200 mg/dL   Triglycerides 101.0 0.0 - 149.0 mg/dL   HDL 42.80 >39.00 mg/dL   VLDL 20.2 0.0 - 40.0 mg/dL   LDL Cholesterol 105 (H) 0 - 99 mg/dL   Total CHOL/HDL Ratio 4    NonHDL 125.46   CBC with Differential/Platelet  Result Value Ref Range   WBC 5.6 4.0 - 10.5 K/uL   RBC 3.76 (L) 4.22 - 5.81 Mil/uL   Hemoglobin 11.9 (L) 13.0 - 17.0 g/dL   HCT 35.3 (L) 39.0 - 52.0 %   MCV 94.0 78.0 - 100.0 fl   MCHC 33.7 30.0 - 36.0 g/dL   RDW 13.1 11.5 - 15.5 %   Platelets 252.0 150.0 - 400.0 K/uL   Neutrophils Relative % 51.4 43.0 - 77.0 %   Lymphocytes Relative 35.4 12.0 - 46.0 %   Monocytes Relative 9.0 3.0 - 12.0 %   Eosinophils Relative 3.4 0.0 - 5.0 %   Basophils Relative 0.8 0.0 - 3.0 %   Neutro Abs 2.9  1.4 - 7.7 K/uL   Lymphs Abs 2.0 0.7 - 4.0 K/uL   Monocytes Absolute 0.5 0.1 - 1.0 K/uL   Eosinophils Absolute 0.2 0.0 - 0.7 K/uL   Basophils Absolute 0.0 0.0 - 0.1 K/uL  Comprehensive metabolic panel  Result Value Ref Range   Sodium 140 135 - 145 mEq/L   Potassium 5.0 3.5 - 5.1 mEq/L   Chloride 106 96 - 112 mEq/L   CO2 26 19 - 32 mEq/L   Glucose, Bld 110 (H) 70 - 99 mg/dL   BUN 42 (H) 6 - 23 mg/dL   Creatinine, Ser 1.65 (H) 0.40 - 1.50 mg/dL   Total Bilirubin 0.4 0.2 - 1.2 mg/dL   Alkaline Phosphatase 35 (L) 39 - 117 U/L   AST 26 0 - 37 U/L   ALT 28 0 - 53 U/L   Total Protein 7.4 6.0 - 8.3 g/dL   Albumin 4.6 3.5 - 5.2 g/dL   GFR 43.70 (L) >60.00 mL/min   Calcium 10.1 8.4 - 10.5 mg/dL  Hemoglobin A1c  Result Value Ref Range   Hgb A1c MFr Bld 6.8 (H) 4.6 - 6.5 %  Microalbumin / creatinine urine ratio  Result Value Ref Range   Microalb, Ur <0.7 0.0 - 1.9 mg/dL   Creatinine,U 80.9 mg/dL   Microalb Creat Ratio 0.9 0.0 - 30.0 mg/g  PSA  Result Value  Ref Range   PSA 3.91 0.10 - 4.00 ng/mL  TSH  Result Value Ref Range   TSH 1.89 0.35 - 5.50 uIU/mL    Last CBC Lab Results  Component Value Date   WBC 5.6 03/31/2022   HGB 11.9 (L) 03/31/2022   HCT 35.3 (L) 03/31/2022   MCV 94.0 03/31/2022   MCH 31.1 08/01/2019   RDW 13.1 03/31/2022   PLT 252.0 24/40/1027   Last metabolic panel Lab Results  Component Value Date   GLUCOSE 110 (H) 03/31/2022   NA 140 03/31/2022   K 5.0 03/31/2022   CL 106 03/31/2022   CO2 26 03/31/2022   BUN 42 (H) 03/31/2022   CREATININE 1.65 (H) 03/31/2022   EGFR 19 10/19/2021   CALCIUM 10.1 03/31/2022   PROT 7.4 03/31/2022   ALBUMIN 4.6 03/31/2022   BILITOT 0.4 03/31/2022   ALKPHOS 35 (L) 03/31/2022   AST 26 03/31/2022   ALT 28 03/31/2022   ANIONGAP 8 08/01/2019   Last lipids Lab Results  Component Value Date   CHOL 168 03/31/2022   HDL 42.80 03/31/2022   LDLCALC 105 (H) 03/31/2022   LDLDIRECT 107.0 06/10/2020   TRIG 101.0 03/31/2022   CHOLHDL 4 03/31/2022   Last hemoglobin A1c Lab Results  Component Value Date   HGBA1C 6.8 (H) 03/31/2022   Last thyroid functions Lab Results  Component Value Date   TSH 1.89 03/31/2022   T4TOTAL 6.0 04/24/2014   Last vitamin D No results found for: "25OHVITD2", "25OHVITD3", "VD25OH" Last vitamin B12 and Folate Lab Results  Component Value Date   VITAMINB12 1,487 (H) 10/25/2021      The 10-year ASCVD risk score (Arnett DK, et al., 2019) is: 29.5%    Assessment & Plan:   Problem List Items Addressed This Visit       Unprioritized   Type 2 diabetes mellitus without complication, without long-term current use of insulin (HCC)   Relevant Medications   insulin NPH Human (HUMULIN N) 100 UNIT/ML injection   Stage 3b chronic kidney disease (HCC)   Relevant Orders   Lipid panel (Completed)  CBC with Differential/Platelet (Completed)   Comprehensive metabolic panel (Completed)   Hemoglobin A1c (Completed)   Microalbumin / creatinine urine  ratio (Completed)   Sleep apnea   Preventative health care - Primary   Relevant Orders   Lipid panel (Completed)   CBC with Differential/Platelet (Completed)   Comprehensive metabolic panel (Completed)   Hemoglobin A1c (Completed)   Microalbumin / creatinine urine ratio (Completed)   Hypothyroidism following radioiodine therapy   Relevant Medications   insulin NPH Human (HUMULIN N) 100 UNIT/ML injection   Other Relevant Orders   TSH (Completed)   HTN (hypertension)   Other Visit Diagnoses     Hyperlipidemia, unspecified hyperlipidemia type       Hyperlipidemia associated with type 2 diabetes mellitus (HCC)       Relevant Medications   insulin NPH Human (HUMULIN N) 100 UNIT/ML injection   Frequent urination       Relevant Orders   PSA (Completed)   Diabetes mellitus without complication (HCC)       Relevant Medications   insulin NPH Human (HUMULIN N) 100 UNIT/ML injection   Memory deficit       Relevant Orders   Ambulatory referral to Neuropsychology   MR Brain Wo Contrast       Return in about 6 months (around 09/30/2022), or if symptoms worsen or fail to improve, for hypertension, hyperlipidemia, diabetes II.    Ann Held, DO

## 2022-04-01 NOTE — Assessment & Plan Note (Signed)
Tolerating statin, encouraged heart healthy diet, avoid trans fats, minimize simple carbs and saturated fats. Increase exercise as tolerated 

## 2022-04-01 NOTE — Assessment & Plan Note (Signed)
Check labs  Pt had been seeing endo

## 2022-04-01 NOTE — Assessment & Plan Note (Signed)
Per endo Check labs

## 2022-04-01 NOTE — Assessment & Plan Note (Signed)
Per u rology 

## 2022-04-01 NOTE — Assessment & Plan Note (Signed)
Ghm utd Check labs  See AVS  

## 2022-04-01 NOTE — Assessment & Plan Note (Signed)
Well controlled, no changes to meds. Encouraged heart healthy diet such as the DASH diet and exercise as tolerated.  °

## 2022-04-01 NOTE — Assessment & Plan Note (Signed)
Per nephrology 

## 2022-04-26 ENCOUNTER — Ambulatory Visit
Admission: RE | Admit: 2022-04-26 | Discharge: 2022-04-26 | Disposition: A | Discharge status: Home / Self Care | Payer: 59 | Source: Ambulatory Visit | Attending: Family Medicine | Admitting: Family Medicine

## 2022-04-26 DIAGNOSIS — R413 Other amnesia: Secondary | ICD-10-CM

## 2022-05-02 ENCOUNTER — Other Ambulatory Visit: Payer: Self-pay | Admitting: Urology

## 2022-05-02 DIAGNOSIS — C61 Malignant neoplasm of prostate: Secondary | ICD-10-CM

## 2022-05-03 ENCOUNTER — Telehealth: Payer: Self-pay | Admitting: Family Medicine

## 2022-05-03 NOTE — Telephone Encounter (Signed)
Spoke with patient. Pt states having some congestion and constantly coughing up mucus and ear pressure. Pt also states he would like more of an explanation regarding the imaging.

## 2022-05-03 NOTE — Telephone Encounter (Signed)
Patient called to get clarification on his imaging results. He said what he looked up was fatal if not treated so he wanted to follow up with doctor. Please call to advise. Patient aware provider not in the office.

## 2022-05-04 NOTE — Telephone Encounter (Signed)
Did you call him?

## 2022-05-04 NOTE — Telephone Encounter (Signed)
Pt states he thought he got a call from our office just a couple minutes ago and was returning that call.

## 2022-05-05 ENCOUNTER — Other Ambulatory Visit: Payer: Self-pay | Admitting: Family Medicine

## 2022-05-05 DIAGNOSIS — J0140 Acute pansinusitis, unspecified: Secondary | ICD-10-CM

## 2022-05-05 MED ORDER — FLUTICASONE PROPIONATE 50 MCG/ACT NA SUSP: 2.0000 | Freq: Every day | NASAL | 6 refills | Status: AC

## 2022-05-05 MED ORDER — AMOXICILLIN-POT CLAVULANATE 875-125 MG PO TABS: 1.0000 | ORAL_TABLET | Freq: Two times a day (BID) | ORAL | 0 refills | Status: DC

## 2022-05-18 ENCOUNTER — Telehealth: Payer: Self-pay

## 2022-05-18 NOTE — Telephone Encounter (Signed)
Spoke to patient and requested that he bring SD card to 05/19/2022 appt.

## 2022-05-19 ENCOUNTER — Encounter: Payer: Self-pay | Admitting: Primary Care

## 2022-05-19 ENCOUNTER — Ambulatory Visit: Payer: 59 | Admitting: Primary Care

## 2022-05-19 VITALS — BP 138/80 | HR 72 | Temp 97.1°F | Ht 74.0 in | Wt 301.0 lb

## 2022-05-19 DIAGNOSIS — G4730 Sleep apnea, unspecified: Secondary | ICD-10-CM

## 2022-05-19 DIAGNOSIS — G4733 Obstructive sleep apnea (adult) (pediatric): Secondary | ICD-10-CM

## 2022-05-19 NOTE — Assessment & Plan Note (Signed)
-   HST 12/28/15 showed severe sleep apnea, AHI 86.5/hour with SpO2 low 66% - Patient is 91% compliant with CPAP use > 4 hours over last 90 days. No residual daytime sleepiness.  - Sleep apnea is well controlled on current CPAP pressure 8-16cm h20; Residual AHI 2.5/hour  - No changes today - We do need DME company to send patient a new SD card d/t incorrect demographics  - Follow-up 1 year with [facility] NP or sooner if needed

## 2022-05-19 NOTE — Progress Notes (Signed)
$'@Patient'r$  ID: Alex Rocha, male    DOB: 03/26/1956, 67 y.o.   MRN: [id number]  Chief Complaint  Patient presents with   Follow-up    Recently had a nasal infection. Mask fits ok. No problems with the machine    Referring provider: Carollee Herter, Alferd Apa, *  HPI: 67 year old male, never smoked. PMH severe OSA, hypertension, DM type 2, sinusitis, GERD. Patient of Dr. Mortimer Fries, last seen on 02/07/21. Home sleep study 12/28/15 >> AHI 86.5, SaO2 low 66.   Previous [facility] pulmonary encounter: 12/16/2021 Patient presents today for fu OSA. He feels his current CPAP machine is not working properly. Machine has been making loud noise as pressure ramps up the last week or two . He called his insurance company and was told that he was eligible for a new machine. He also would like to change DME companies. He is currently with [employer]. Airview download shows 100% compliance, average usage 6 hours 19 mins. Current pressure 10-18cm h20, residual AHI 2.4/hr.   Airview download 11/16/21-12/15/21 30/30 days used; 93% > 4 hours Average usage 6 hours 19 minutes Pressure 10 to 18 cm H2O (17.8cm h20-95%) Airleaks 29.6L/min (95%) AHI 2.4  05/19/2022- Interim hx  Patient presents today for 3-4 month follow-up OSA. He is doing well. No acute complaint. He got a replacement CPAP machine in September/October 2023. No issues with machine, pressure or mask. Tolerating pressure change, at last visit we lowered pressure to 8-16cm h20. Sleeping fairly decent, only think that wakes him up on occasional will be the alarm on his phone if blood glucose <70. Last A1c 6.8.   Airview download 02/18/22-05/18/22 Usage 86/90 days (96%); 82 days (91%) > 4 hours Average usage 6 hours 13 mins Pressure 8-16cm h20 (15.8cm h20-95%) Airleaks 31L/min (95%) AHI 2.5    Allergies  Allergen Reactions   [name] [Yohimbine] Palpitations    Immunization History  Administered Date(s) Administered   Influenza,inj,Quad PF,6+ Mos 04/28/2013,  01/26/2014, 12/11/2014, 02/04/2016, 01/07/2018, 12/31/2018   Influenza-Unspecified 02/12/2020, 01/25/2022   Moderna SARS-COV2 Booster Vaccination 09/03/2020   Moderna Sars-Covid-2 Vaccination 04/28/2019, 05/26/2019, 02/17/2020   PNEUMOCOCCAL CONJUGATE-20 12/10/2020   Pfizer Covid-19 Vaccine Bivalent Booster 68yr & up 01/25/2022   Pneumococcal Polysaccharide-23 01/11/2015   Tdap 01/11/2015   Zoster Recombinat (Shingrix) 12/04/2017, 02/05/2018    Past Medical History:  Diagnosis Date   Anemia    Cataract    Colon polyps    Diabetes mellitus    Environmental allergies    Hyperlipidemia    Hypertension    Hyperthyroidism    Kidney stones    Low testosterone    [place]    Sleep apnea     Tobacco History: Social History   Tobacco Use  Smoking Status Never  Smokeless Tobacco Never   Counseling given: Not Answered   Outpatient Medications Prior to Visit  Medication Sig Dispense Refill   BD INSULIN SYRINGE U/F 31G X 5/16" 1 ML MISC 1 EACH BY OTHER ROUTE 2 (TWO) TIMES DAILY. AS DIRECTED 100 each 7   Cinnamon 500 MG capsule Take 500 mg by mouth daily.     co-enzyme Q-10 30 MG capsule Take 30 mg by mouth 3 (three) times daily.     Continuous Blood Gluc Sensor (DEXCOM G6 SENSOR) MISC 1 EACH BY DOES NOT APPLY ROUTE SEE ADMIN INSTRUCTIONS. CHANGE EVERY 10 DAYS 3 each 11   Continuous Blood Gluc Transmit (DEXCOM G6 TRANSMITTER) MISC Use as Directed 1 each 3  diclofenac sodium (VOLTAREN) 1 % GEL APPLY 4 G TOPICALLY 4 (FOUR) TIMES DAILY. 100 g 1   fenofibrate 160 MG tablet TAKE 1 TABLET BY MOUTH EVERY DAY 90 tablet 1   fluticasone (FLONASE) 50 MCG/ACT nasal spray Place 2 sprays into both nostrils daily. 16 g 6   insulin NPH Human (HUMULIN N) 100 UNIT/ML injection 80 units each morning, and 60 units each evening. 140 mL 1   levothyroxine (SYNTHROID) 112 MCG tablet TAKE 1 TABLET BY MOUTH EVERY DAY 90 tablet 1   loratadine (CLARITIN) 10 MG tablet TAKE 1 TABLET BY MOUTH  EVERY DAY 90 tablet 1   losartan (COZAAR) 100 MG tablet TAKE 1 TABLET BY MOUTH EVERY DAY 90 tablet 1   Omega 3-6-9 Fatty Acids (OMEGA 3-6-9 COMPLEX PO) Take by mouth.     tamsulosin (FLOMAX) 0.4 MG CAPS capsule Take 0.4 mg by mouth daily.     Turmeric 1053 MG TABS Take by mouth.     amoxicillin-clavulanate (AUGMENTIN) 875-125 MG tablet Take 1 tablet by mouth 2 (two) times daily. (Patient not taking: Reported on 05/19/2022) 20 tablet 0   No facility-administered medications prior to visit.    Review of Systems  Review of Systems  Constitutional: Negative.   HENT: Negative.    Respiratory: Negative.    Cardiovascular: Negative.     Physical Exam  BP 138/80 (BP Location: Left Wrist, Cuff Size: Normal)   Pulse 72   Temp (!) 97.1 F (36.2 C)   Ht '6\' 2"'$  (1.88 m)   Wt (!) 301 lb (136.5 kg)   SpO2 99%   BMI 38.65 kg/m  Physical Exam Constitutional:      Appearance: Normal appearance.  HENT:     Head: Normocephalic and atraumatic.     Mouth/Throat:     Mouth: Mucous membranes are moist.     Pharynx: Oropharynx is clear.  Cardiovascular:     Rate and Rhythm: Normal rate and regular rhythm.  Pulmonary:     Effort: Pulmonary effort is normal.     Breath sounds: Normal breath sounds.  Musculoskeletal:        General: Normal range of motion.  Skin:    General: Skin is warm and dry.  Neurological:     General: No focal deficit present.     Mental Status: He is alert and oriented to person, place, and time. Mental status is at baseline.  Psychiatric:        Mood and Affect: Mood normal.        Behavior: Behavior normal.        Thought Content: Thought content normal.        Judgment: Judgment normal.      Lab Results:  CBC    Component Value Date/Time   WBC 5.6 03/31/2022 0951   RBC 3.76 (L) 03/31/2022 0951   HGB 11.9 (L) 03/31/2022 0951   HCT 35.3 (L) 03/31/2022 0951   PLT 252.0 03/31/2022 0951   MCV 94.0 03/31/2022 0951   MCH 31.1 08/01/2019 1831   MCHC 33.7  03/31/2022 0951   RDW 13.1 03/31/2022 0951   LYMPHSABS 2.0 03/31/2022 0951   MONOABS 0.5 03/31/2022 0951   EOSABS 0.2 03/31/2022 0951   BASOSABS 0.0 03/31/2022 0951    BMET    Component Value Date/Time   NA 140 03/31/2022 0951   NA 142 10/19/2021 0000   K 5.0 03/31/2022 0951   CL 106 03/31/2022 0951   CO2 26 03/31/2022 0951   GLUCOSE  110 (H) 03/31/2022 0951   BUN 42 (H) 03/31/2022 0951   BUN 73 (A) 10/19/2021 0000   CREATININE 1.65 (H) 03/31/2022 0951   CREATININE 1.66 (H) 07/05/2018 1607   CALCIUM 10.1 03/31/2022 0951   GFRNONAA 37 (L) 08/01/2019 1831   GFRAA 43 (L) 08/01/2019 1831    BNP No results found for: "BNP"  ProBNP No results found for: "PROBNP"  Imaging: MR Brain Wo Contrast  Result Date: 04/26/2022 CLINICAL DATA:  Provided history: Mental status change, unknown cause. Additional history provided by the scanning technologist: Short-term memory loss, episodes of confusion, history of prostate cancer. EXAM: MRI HEAD WITHOUT CONTRAST TECHNIQUE: Multiplanar, multiecho pulse sequences of the brain and surrounding structures were obtained without intravenous contrast. COMPARISON:  No pertinent prior exams available for comparison. FINDINGS: Brain: No age advanced or lobar predominant parenchymal atrophy. Multifocal T2 FLAIR hyperintense signal abnormality within the cerebral white matter, nonspecific but compatible with mild chronic small vessel ischemic disease. Punctate chronic microhemorrhages within the mid right frontal lobe and right temporal occipital junction (2 total). 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 There is no acute infarct. No evidence of an intracranial mass. No extra-axial fluid collection. No midline shift. Vascular: Maintained flow voids within the proximal large arterial vessels. Skull and upper cervical spine: No focal suspicious marrow lesion. Sinuses/Orbits: Bilateral proptosis. Prior bilateral ocular lens replacement.  Sclerosis and mild mucosal thickening within the left maxillary sinus. Other: Small left mastoid effusion. IMPRESSION: 1. No evidence of acute intracranial abnormality. 2. Mild chronic small vessel ischemic changes within the cerebral white matter. 3. 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 4. No age advanced or lobar predominant parenchymal atrophy. 5. Bilateral proptosis. 6. Left maxillary sinus disease, as described. 7. Small left mastoid effusion. Electronically Signed   By: Kellie Simmering D.O.   On: 04/26/2022 09:05     Assessment & Plan:   Sleep apnea - HST 12/28/15 showed severe sleep apnea, AHI 86.5/hour with SpO2 low 66% - Patient is 91% compliant with CPAP use > 4 hours over last 90 days. No residual daytime sleepiness.  - Sleep apnea is well controlled on current CPAP pressure 8-16cm h20; Residual AHI 2.5/hour  - No changes today - We do need DME company to send patient a new SD card d/t incorrect demographics  - Follow-up 1 year with Eustaquio Maize NP or sooner if needed    Martyn Ehrich, NP 05/19/2022

## 2022-05-19 NOTE — Patient Instructions (Signed)
Sleep apnea is well controlled on current CPAP pressure 8-16cm h20 You are having no significant residual apnea, AHI 2.5/hour (<5 is normal) No changes today We do need DME company to supply you with a new SD card d/t incorrect demographics   Follow-up 1 year with [facility] NP or sooner if needed   CPAP and BIPAP Information CPAP and BIPAP are methods that use air pressure to keep your airways open and to help you breathe well. CPAP and BIPAP use different amounts of pressure. Your health care provider will tell you whether CPAP or BIPAP would be more helpful for you. CPAP stands for "continuous positive airway pressure." With CPAP, the amount of pressure stays the same while you breathe in (inhale) and out (exhale). BIPAP stands for "bi-level positive airway pressure." With BIPAP, the amount of pressure will be higher when you inhale and lower when you exhale. This allows you to take larger breaths. CPAP or BIPAP may be used in the hospital, or your health care provider may want you to use it at home. You may need to have a sleep study before your health care provider can order a machine for you to use at home. What are the advantages? CPAP or BIPAP can be helpful if you have: Sleep apnea. Chronic obstructive pulmonary disease (COPD). Heart failure. Medical conditions that cause muscle weakness, including muscular dystrophy or amyotrophic lateral sclerosis (ALS). Other problems that cause breathing to be shallow, weak, abnormal, or difficult. CPAP and BIPAP are most commonly used for obstructive sleep apnea (OSA) to keep the airways from collapsing when the muscles relax during sleep. What are the risks? Generally, this is a safe treatment. However, problems may occur, including: Irritated skin or skin sores if the mask does not fit properly. Dry or stuffy nose or nosebleeds. Dry mouth. Feeling gassy or bloated. Sinus or lung infection if the equipment is not cleaned properly. When should  CPAP or BIPAP be used? In most cases, the mask only needs to be worn during sleep. Generally, the mask needs to be worn throughout the night and during any daytime naps. People with certain medical conditions may also need to wear the mask at other times, such as when they are awake. Follow instructions from your health care provider about when to use the machine. What happens during CPAP or BIPAP?  Both CPAP and BIPAP are provided by a small machine with a flexible plastic tube that attaches to a plastic mask that you wear. Air is blown through the mask into your nose or mouth. The amount of pressure that is used to blow the air can be adjusted on the machine. Your health care provider will set the pressure setting and help you find the best mask for you. Tips for using the mask Because the mask needs to be snug, some people feel trapped or closed-in (claustrophobic) when first using the mask. If you feel this way, you may need to get used to the mask. One way to do this is to hold the mask loosely over your nose or mouth and then gradually apply the mask more snugly. You can also gradually increase the amount of time that you use the mask. Masks are available in various types and sizes. If your mask does not fit well, talk with your health care provider about getting a different one. Some common types of masks include: Full face masks, which fit over the mouth and nose. Nasal masks, which fit over the nose. Nasal pillow  or prong masks, which fit into the nostrils. If you are using a mask that fits over your nose and you tend to breathe through your mouth, a chin strap may be applied to help keep your mouth closed. Use a skin barrier to protect your skin as told by your health care provider. Some CPAP and BIPAP machines have alarms that may sound if the mask comes off or develops a leak. If you have trouble with the mask, it is very important that you talk with your health care provider about finding  a way to make the mask easier to tolerate. Do not stop using the mask. There could be a negative impact on your health if you stop using the mask. Tips for using the machine Place your CPAP or BIPAP machine on a secure table or stand near an electrical outlet. Know where the on/off switch is on the machine. Follow instructions from your health care provider about how to set the pressure on your machine and when you should use it. Do not eat or drink while the CPAP or BIPAP machine is on. Food or fluids could get pushed into your lungs by the pressure of the CPAP or BIPAP. For home use, CPAP and BIPAP machines can be rented or purchased through home health care companies. Many different brands of machines are available. Renting a machine before purchasing may help you find out which particular machine works well for you. Your health insurance company may also decide which machine you may get. Keep the CPAP or BIPAP machine and attachments clean. Ask your health care provider for specific instructions. Check the humidifier if you have a dry stuffy nose or nosebleeds. Make sure it is working correctly. Follow these instructions at home: Take over-the-counter and prescription medicines only as told by your health care provider. Ask if you can take sinus medicine if your sinuses are blocked. Do not use any products that contain nicotine or tobacco. These products include cigarettes, chewing tobacco, and vaping devices, such as e-cigarettes. If you need help quitting, ask your health care provider. Keep all follow-up visits. This is important. Contact a health care provider if: You have redness or pressure sores on your head, face, mouth, or nose from the mask or head gear. You have trouble using the CPAP or BIPAP machine. You cannot tolerate wearing the CPAP or BIPAP mask. Someone tells you that you snore even when wearing your CPAP or BIPAP. Get help right away if: You have trouble breathing. You  feel confused. Summary CPAP and BIPAP are methods that use air pressure to keep your airways open and to help you breathe well. If you have trouble with the mask, it is very important that you talk with your health care provider about finding a way to make the mask easier to tolerate. Do not stop using the mask. There could be a negative impact to your health if you stop using the mask. Follow instructions from your health care provider about when to use the machine. This information is not intended to replace advice given to you by your health care provider. Make sure you discuss any questions you have with your health care provider. Document Revised: 11/10/2020 Document Reviewed: 03/12/2020 Elsevier Patient Education  [place].

## 2022-06-01 ENCOUNTER — Other Ambulatory Visit: Payer: Self-pay | Admitting: Family Medicine

## 2022-06-08 ENCOUNTER — Ambulatory Visit
Admission: RE | Admit: 2022-06-08 | Discharge: 2022-06-08 | Disposition: A | Discharge status: Home / Self Care | Payer: 59 | Source: Ambulatory Visit | Attending: Urology | Admitting: Urology

## 2022-06-08 DIAGNOSIS — C61 Malignant neoplasm of prostate: Secondary | ICD-10-CM

## 2022-06-08 MED ORDER — GADOPICLENOL 0.5 MMOL/ML IV SOLN
10.0000 mL | Freq: Once | INTRAVENOUS | Status: AC | PRN
  Administered 2022-06-08: 10 mL via INTRAVENOUS

## 2022-06-14 ENCOUNTER — Telehealth: Payer: Self-pay | Admitting: Family Medicine

## 2022-06-14 DIAGNOSIS — R413 Other amnesia: Secondary | ICD-10-CM

## 2022-06-14 NOTE — Telephone Encounter (Signed)
Noted. Vaccines already in chart

## 2022-06-14 NOTE — Telephone Encounter (Signed)
Patient would like to update his chart, he states he had his Covid and Flu vaccine done on October 13th of 2023. He got it done at the [facility] in [place].

## 2022-06-14 NOTE — Telephone Encounter (Signed)
Pt called to follow up on referral to neurology. Advised that they are not taking outside referrals due to staffing so pt will need to be referred elsewhere.   Also, pt said MRI showed diverticulitis and he wants to know what she recommends he do for that. Please call to advise.

## 2022-06-21 NOTE — Addendum Note (Signed)
Addended by: Kem Boroughs D on: 06/21/2022 10:18 AM   Modules accepted: Orders

## 2022-06-21 NOTE — Telephone Encounter (Signed)
Spoke with patient advised him about diverticulosis.  Also sent referral to [facility] medicine [facility] re[facility] to see Dr. Ilean Skill for neurophysch.

## 2022-06-22 ENCOUNTER — Ambulatory Visit: Payer: 59 | Admitting: Internal Medicine

## 2022-06-22 ENCOUNTER — Encounter: Payer: Self-pay | Admitting: Internal Medicine

## 2022-06-22 VITALS — BP 136/66 | HR 78 | Ht 74.0 in | Wt 302.6 lb

## 2022-06-22 DIAGNOSIS — E041 Nontoxic single thyroid nodule: Secondary | ICD-10-CM | POA: Diagnosis not present

## 2022-06-22 DIAGNOSIS — E1159 Type 2 diabetes mellitus with other circulatory complications: Secondary | ICD-10-CM

## 2022-06-22 DIAGNOSIS — E890 Postprocedural hypothyroidism: Secondary | ICD-10-CM | POA: Diagnosis not present

## 2022-06-22 DIAGNOSIS — Z794 Long term (current) use of insulin: Secondary | ICD-10-CM

## 2022-06-22 LAB — POCT GLYCOSYLATED HEMOGLOBIN (HGB A1C): Hemoglobin A1C: 6.9 % — AB (ref 4.0–5.6)

## 2022-06-22 MED ORDER — EMPAGLIFLOZIN 10 MG PO TABS: 10.0000 mg | ORAL_TABLET | Freq: Every day | ORAL | 3 refills | Status: DC

## 2022-06-22 NOTE — Patient Instructions (Addendum)
Please continue: - NPH 80 units in a.m. and 60 units at night  Please add: - Jardiance 10 mg before b'fast  Please continue Levothyroxine 112 mcg daily.  Take the thyroid hormone every day, with water, at least 30 minutes before breakfast, separated by at least 4 hours from: - acid reflux medications - calcium - iron - multivitamins  Please return for another visit in 4 months.   PATIENT INSTRUCTIONS FOR TYPE 2 DIABETES:  DIET AND EXERCISE Diet and exercise is an important part of diabetic treatment.  We recommended aerobic exercise in the form of brisk walking (working between 40-60% of maximal aerobic capacity, similar to brisk walking) for 150 minutes per week (such as 30 minutes five days per week) along with 3 times per week performing 'resistance' training (using various gauge rubber tubes with handles) 5-10 exercises involving the major muscle groups (upper body, lower body and core) performing 10-15 repetitions (or near fatigue) each exercise. Start at half the above goal but build slowly to reach the above goals. If limited by weight, joint pain, or disability, we recommend daily walking in a swimming pool with water up to waist to reduce pressure from joints while allow for adequate exercise.    BLOOD GLUCOSES Monitoring your blood glucoses is important for continued management of your diabetes. Please check your blood glucoses 2-4 times a day: fasting, before meals and at bedtime (you can rotate these measurements - e.g. one day check before the 3 meals, the next day check before 2 of the meals and before bedtime, etc.).   HYPOGLYCEMIA (low blood sugar) Hypoglycemia is usually a reaction to not eating, exercising, or taking too much insulin/ other diabetes drugs.  Symptoms include tremors, sweating, hunger, confusion, headache, etc. Treat IMMEDIATELY with 15 grams of Carbs: 4 glucose tablets  cup regular juice/soda 2 tablespoons raisins 4 teaspoons sugar 1 tablespoon  honey Recheck blood glucose in 15 mins and repeat above if still symptomatic/blood glucose <100.  RECOMMENDATIONS TO REDUCE YOUR RISK OF DIABETIC COMPLICATIONS: * Take your prescribed MEDICATION(S) * Follow a DIABETIC diet: Complex carbs, fiber rich foods, (monounsaturated and polyunsaturated) fats * AVOID saturated/trans fats, high fat foods, >2,300 mg salt per day. * EXERCISE at least 5 times a week for 30 minutes or preferably daily.  * DO NOT SMOKE OR DRINK more than 1 drink a day. * Check your FEET every day. Do not wear tightfitting shoes. Contact us if you develop an ulcer * See your EYE doctor once a year or more if needed * Get a FLU shot once a year * Get a PNEUMONIA vaccine once before and once after age 63 years  GOALS:  * Your Hemoglobin A1c of <7%  * fasting sugars need to be 80-130 * after meals sugars need to be <180 (2h after you start eating) * Your Systolic BP should be [id number] or lower  * Your Diastolic BP should be 80 or lower  * Your HDL (Good Cholesterol) should be 40 or higher  * Your LDL (Bad Cholesterol) should be ideally <70. * Your Triglycerides should be 150 or lower  * Your Urine microalbumin (kidney function) should be <30 * Your Body Mass Index should be 25 or lower   Please consider the following ways to cut down carbs and fat and increase fiber and micronutrients in your diet: - substitute whole grain for white bread or pasta - substitute brown rice for white rice - substitute 90-calorie flat bread pieces for slices of  bread when possible - substitute sweet potatoes or yams for white potatoes - substitute humus for margarine - substitute tofu for cheese when possible - substitute almond or rice milk for regular milk (would not drink soy milk daily due to concern for soy estrogen influence on breast cancer risk) - substitute dark chocolate for other sweets when possible - substitute water - can add lemon or orange slices for taste - for diet sodas  (artificial sweeteners will trick your body that you can eat sweets without getting calories and will lead you to overeating and weight gain in the long run) - do not skip breakfast or other meals (this will slow down the metabolism and will result in more weight gain over time)  - can try smoothies made from fruit and almond/rice milk in am instead of regular breakfast - can also try old-fashioned (not instant) oatmeal made with almond/rice milk in am - order the dressing on the side when eating salad at a restaurant (pour less than half of the dressing on the salad) - eat as little meat as possible - can try juicing, but should not forget that juicing will get rid of the fiber, so would alternate with eating raw veg./fruits or drinking smoothies - use as little oil as possible, even when using olive oil - can dress a salad with a mix of balsamic vinegar and lemon juice, for e.g. - use agave nectar, stevia sugar, or regular sugar rather than artificial sweateners - steam or broil/roast veggies  - snack on veggies/fruit/nuts (unsalted, preferably) when possible, rather than processed foods - reduce or eliminate aspartame in diet (it is in diet sodas, chewing gum, etc) Read the labels!  Try to read Dr. Janene Harvey book: "Program for Reversing Diabetes" for other ideas for healthy eating.

## 2022-06-22 NOTE — Progress Notes (Signed)
Patient ID: Alex Rocha, male   DOB: 12/05/1955, 67 y.o.   MRN: [id number]  HPI: Alex Rocha is a 67 y.o.-year-old male, returning for follow-up for DM2, dx in 2000, insulin-dependent since 2014, controlled, with complications (cerebellar CVA, CKD stage III, peripheral neuropathy) and also thyroid nodule and post ablative hypothyroidism. previously saw Dr. Loanne Drilling, last visit 11 months ago.  DM2: Reviewed HbA1c: Lab Results  Component Value Date   HGBA1C 6.8 (H) 03/31/2022   HGBA1C 6.4 10/25/2021   HGBA1C 6.0 (A) 08/04/2021   HGBA1C 5.9 (A) 03/31/2021   HGBA1C 5.7 (A) 11/29/2020   HGBA1C 7.1 (H) 09/14/2020   HGBA1C 6.9 (A) 07/09/2020   HGBA1C 6.3 03/09/2020   HGBA1C 5.9 10/02/2019   HGBA1C 6.6 (H) 05/29/2019   Pt is on a regimen of: - NPH 80 units in a.m. and 60 units at night Also on cinnamon 500 mg daily. He tried Metformin in the past - tolerated well, from what he can remember  Pt checks his sugars >4x a day a day and they are:    Lowest sugar was 51 - seldom; he has hypoglycemia awareness at 70.  Highest sugar was 250.  Glucometer: none  Pt's meals are: - Breakfast:  fruit - Lunch: salad , sandwich - Dinner: meat (chicken or grilled pork or beef) + starch (low carb pasta) + veggies; veggie burgers; may eat out - Snacks: no sweet drinks; crackers or cookies  - + stage 3 CKD, last BUN/creatinine:  Lab Results  Component Value Date   BUN 42 (H) 03/31/2022   BUN 44 (H) 10/25/2021   CREATININE 1.65 (H) 03/31/2022   CREATININE 1.71 (H) 10/25/2021  On Cozaar 100 mg daily.  -+ HL; last set of lipids: Lab Results  Component Value Date   CHOL 168 03/31/2022   HDL 42.80 03/31/2022   LDLCALC 105 (H) 03/31/2022   LDLDIRECT 107.0 06/10/2020   TRIG 101.0 03/31/2022   CHOLHDL 4 03/31/2022  On fenofibrate 160 mg daily, omega-3 fatty acids.  - last eye exam was in 09/21/2021. No DR reportedly. He had cataract Sx - summer 2023.  - + numbness and tingling in his  feet. Last foot exam 03/2022.  Thyroid nodule:  Thyroid U/S (01/01/2013): Right thyroid lobe: 6.6 x 2.9 x 2.9 cm several rounded nodules are present  within the right lobe. The largest is a 2.3 x 1.6 cm solid nodule in  the mid right thyroid gland.   Left thyroid lobe: 5.7 x 2.4 x 2.4 cm.  No nodules visualized.   Isthmus Thickness: 0.4 cm   No nodules visualized.   Lymphadenopathy: None visualized.   IMPRESSION:  1. Multiple nodules within the right lobe of thyroid gland. The  largest nodule measures 2.3 cm and is solid. Findings meet consensus  criteria for biopsy.   Thyroid uptake and scan (01/21/2013): Normal 24 hr radio iodine uptake of 17.3%.  Dominant right lobe nodule measuring 2.3 cm diameter on ultrasound is cold on scintigraphy.  Malignancy not excluded.   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  Solitary 15 x 17 x 16 mm hypoechoic solid nodule in the lower pole of the right gland. This has decreased in size compared to 23 x 17 x 16 mm previously. The background thyroid parenchyma is mildly heterogeneous.   Left thyroid lobe: 3.8 x 1.3 x 1.5 cm. Small and relatively heterogeneous thyroid gland. No discrete nodule.  Isthmus Thickness: 0.5 cm.  No nodules visualized.   Lymphadenopathy: None visualized.   IMPRESSION: 1. Interval involution of the previously noted right lower pole thyroid nodule which presently measures a maximum of 17 mm compared to 23 mm on 01/01/2013. Decrease in size over time is highly consistent with a benign process. 2. No additional thyroid nodules identified. 3. The background thyroid gland is mildly heterogeneous.  Pt denies: - feeling nodules in neck - hoarseness - dysphagia - choking  Post ablative hypothyroidism  Pt is on levothyroxine 112 mcg daily, taken: - in am - fasting - at least 30 min from b'fast - no calcium - no iron - no  multivitamins - no PPIs - not on Biotin  Reviewed his TSH levels: Lab Results  Component Value Date   TSH 1.89 03/31/2022   TSH 1.59 10/25/2021   TSH 0.65 09/29/2021   TSH 1.82 12/10/2020   TSH 2.49 06/10/2020   TSH 2.42 03/09/2020   TSH 1.47 10/02/2019   TSH 0.76 09/11/2019   TSH 5.02 (H) 08/08/2019   TSH 3.62 05/29/2019   TSH 3.25 02/07/2019   TSH 1.58 12/04/2017   TSH 2.08 08/14/2016   TSH 2.50 06/18/2015   TSH 4.060 04/24/2014   TSH 2.70 03/09/2014   TSH 2.69 12/08/2013   TSH 5.04 (H) 08/25/2013   TSH 0.05 (L) 06/26/2013   TSH 0.05 (L) 06/18/2013   No FH of thyroid cancer. No h/o radiation tx to head or neck. No herbal supplements. No Biotin use. No recent steroids use.   He also has a history of OSA, HTN, prostate cancer, kidney stones, low testosterone, cataract.  ROS: + see HPI No increased urination, blurry vision, nausea, chest pain.  Past Medical History:  Diagnosis Date   Anemia    Cataract    Colon polyps    Diabetes mellitus    Environmental allergies    Hyperlipidemia    Hypertension    Hyperthyroidism    Kidney stones    Low testosterone    Prostate cancer [facility])    Sleep apnea    Past Surgical History:  Procedure Laterality Date   CATARACT EXTRACTION Left 09/2021   CATARACT EXTRACTION Right 11/2021   PROSTATE BIOPSY     VASECTOMY     Social History   Socioeconomic History   Marital status: Married    Spouse name: Not on file   Number of children: 2   Years of education: Not on file   Highest education level: Not on file  Occupational History   Occupation: Nurse, adult  Tobacco Use   Smoking status: Never   Smokeless tobacco: Never  Vaping Use   Vaping Use: Never used  Substance and Sexual Activity   Alcohol use: Yes    Comment: rare - on social occasions   Drug use: No   Sexual activity: Yes  Other Topics Concern   Not on file  Social History Narrative   Exercise----not as much--- 1-2 x a week    Social  Determinants of Health   Financial Resource Strain: Not on file  Food Insecurity: Not on file  Transportation Needs: Not on file  Physical Activity: Not on file  Stress: Not on file  Social Connections: Not on file  Intimate Partner Violence: Not on file   Current Outpatient Medications on File Prior to Visit  Medication Sig Dispense Refill   amoxicillin-clavulanate (AUGMENTIN) 875-125 MG tablet Take 1 tablet by mouth 2 (two) times daily. (Patient not taking: Reported  on 05/19/2022) 20 tablet 0   BD INSULIN SYRINGE U/F 31G X 5/16" 1 ML MISC 1 EACH BY OTHER ROUTE 2 (TWO) TIMES DAILY. AS DIRECTED 100 each 7   Cinnamon 500 MG capsule Take 500 mg by mouth daily.     co-enzyme Q-10 30 MG capsule Take 30 mg by mouth 3 (three) times daily.     Continuous Blood Gluc Sensor (DEXCOM G6 SENSOR) MISC 1 EACH BY DOES NOT APPLY ROUTE SEE ADMIN INSTRUCTIONS. CHANGE EVERY 10 DAYS 3 each 11   Continuous Blood Gluc Transmit (DEXCOM G6 TRANSMITTER) MISC Use as Directed 1 each 3   diclofenac sodium (VOLTAREN) 1 % GEL APPLY 4 G TOPICALLY 4 (FOUR) TIMES DAILY. 100 g 1   fenofibrate 160 MG tablet TAKE 1 TABLET BY MOUTH EVERY DAY 90 tablet 1   fluticasone (FLONASE) 50 MCG/ACT nasal spray Place 2 sprays into both nostrils daily. 16 g 6   insulin NPH Human (HUMULIN N) 100 UNIT/ML injection 80 units each morning, and 60 units each evening. 140 mL 1   levothyroxine (SYNTHROID) 112 MCG tablet TAKE 1 TABLET BY MOUTH EVERY DAY 30 tablet 5   loratadine (CLARITIN) 10 MG tablet TAKE 1 TABLET BY MOUTH EVERY DAY 90 tablet 1   losartan (COZAAR) 100 MG tablet TAKE 1 TABLET BY MOUTH EVERY DAY 90 tablet 1   Omega 3-6-9 Fatty Acids (OMEGA 3-6-9 COMPLEX PO) Take by mouth.     tamsulosin (FLOMAX) 0.4 MG CAPS capsule Take 0.4 mg by mouth daily.     Turmeric 1053 MG TABS Take by mouth.     No current facility-administered medications on file prior to visit.   Allergies  Allergen Reactions   [name] [Yohimbine] Palpitations    Family History  Problem Relation Age of Onset   Colon cancer Mother    Heart disease Mother    Hypertension Mother        Mother's side of the family   Diabetes Mother        Mother's side of the family   Hyperlipidemia Sister    Hypertension Sister    Diabetes Sister    Heart disease Sister    Osteoarthritis Sister    Diabetes Sister    Colon cancer Brother    Cancer Paternal Aunt        unknown type   Rectal cancer Other    Stomach cancer Other    Heart disease Other    Diabetes Other        father's family   Breast cancer Neg Hx    Pancreatic cancer Neg Hx    PE: BP 136/66 (BP Location: Left Arm, Patient Position: Sitting, Cuff Size: Normal)   Pulse 78   Ht '6\' 2"'$  (1.88 m)   Wt (!) 302 lb 9.6 oz (137.3 kg)   SpO2 97%   BMI 38.85 kg/m  Wt Readings from Last 3 Encounters:  06/22/22 (!) 302 lb 9.6 oz (137.3 kg)  05/19/22 (!) 301 lb (136.5 kg)  03/31/22 291 lb (132 kg)   Constitutional: overweight, in NAD Eyes:  EOMI, + B exophthalmos ENT: no neck masses, no cervical lymphadenopathy Cardiovascular: RRR, No MRG Respiratory: CTA B Musculoskeletal: no deformities Skin:no rashes Neurological: no tremor with outstretched hands  ASSESSMENT: 1. DM2, insulin-dependent, controlled, with complications - CVA -cerebellum -seen on MRI 04/26/2022 - CKD stage 3 - PN  2. Right thyroid nodule  3. Post ablative hypothyroidism  PLAN:  1. Patient with long-standing, controlled diabetes, on oral  antidiabetic regimen, with still poor control.  At today's visit, HbA1c is **. CGM interpretation: -At today's visit, we reviewed his CGM downloads: It appears that 60% of values are in target range (goal >70%), while 40% are higher than 180 (goal <25%), and 0% are lower than 70 (goal <4%).  The calculated average blood sugar is 171.  The projected HbA1c for the next 3 months (GMI) is 7.4%. -Reviewing the CGM trends, sugars appear to be worse in the last 2 weeks compared to the  previous 2 weeks (see HPI).  They are increasing after every meal, with increased blood sugars after breakfast and lunch in the last 2 weeks.  He does mention that he has been eating out more with his wife lately.  We discussed about the need to reduce eating out.  However, we also discussed that the regimen that contains only intermediate acting insulin is not covering good mealtime coverage.  I did suggest better mealtime coverage with an SGLT2 inhibitor.  Discussed about benefits including weight loss (he does have knee pain so weight loss would be indicated), improving cardiovascular and renal outcomes.  He agrees to try this.  I suggested Jardiance 10 mg daily before breakfast.  I sent a prescription for this to his pharmacy.  He is not on Medicare yet but will switch to this either this year.  I did advise him that with improving blood sugars, we may need to reduce his NPH doses.  I advised him to let me know if his sugars start trending low. - I suggested to:  Patient Instructions  Please continue: - NPH 80 units in a.m. and 60 units at night  Please add: - Jardiance 10 mg before b'fast  Please continue Levothyroxine 112 mcg daily.  Take the thyroid hormone every day, with water, at least 30 minutes before breakfast, separated by at least 4 hours from: - acid reflux medications - calcium - iron - multivitamins  Please return for another visit in 4 months.   - check sugars at different times of the day - check 4x a day, rotating checks - discussed about CBG targets for treatment: 80-130 mg/dL before meals and <180 mg/dL after meals; target HbA1c <7%. - given foot care handout  - given instructions for hypoglycemia management "15-15 rule"  - advised for yearly eye exams-she is up-to-date - Return to clinic in 3-4 months  2. Right thyroid nodule -With history of benign biopsy -see HPI -He had RAI treatment and the size of the nodule and the rest of the thyroid decreased  afterwards. -Follow-up ultrasound from 2016 showed a much smaller R nodule, with no need for follow-up -No neck compression symptoms or neck masses felt on palpation of his neck today  3. Post ablative hypothyroidism - latest thyroid labs reviewed with pt. >> normal: Lab Results  Component Value Date   TSH 1.89 03/31/2022  - he continues on LT4 112 mcg daily - pt feels good on this dose. - we discussed about taking the thyroid hormone every day, with water, >30 minutes before breakfast, separated by >4 hours from acid reflux medications, calcium, iron, multivitamins. Pt. is taking it correctly. -We will repeat his TFTs a year after the latest  - Total time spent for the visit: 40 min, in precharting, reviewing Dr. Cordelia Pen last note, obtaining medical information from the chart and from the pt, reviewing his  previous labs, evaluations, and treatments, reviewing his symptoms, counseling him about his diabetes and his thyroid disease (  please see the discussed topics above), and developing a plan to further investigate and treat them.  Philemon Kingdom, MD PhD [facility]

## 2022-07-04 ENCOUNTER — Encounter: Payer: Self-pay | Admitting: Psychology

## 2022-08-02 ENCOUNTER — Encounter: Payer: Self-pay | Admitting: Family Medicine

## 2022-08-10 ENCOUNTER — Encounter: Payer: Self-pay | Admitting: Family Medicine

## 2022-08-10 ENCOUNTER — Encounter: Payer: Self-pay | Admitting: Internal Medicine

## 2022-08-10 ENCOUNTER — Other Ambulatory Visit: Payer: Self-pay | Admitting: Family Medicine

## 2022-08-10 DIAGNOSIS — E119 Type 2 diabetes mellitus without complications: Secondary | ICD-10-CM

## 2022-08-10 MED ORDER — DEXCOM G6 SENSOR MISC: 1.0000 | 3 refills | Status: DC

## 2022-08-10 MED ORDER — DEXCOM G6 TRANSMITTER MISC: 3 refills | Status: DC

## 2022-08-10 MED ORDER — "INSULIN SYRINGE-NEEDLE U-100 31G X 5/16"" 1 ML MISC": 3 refills | Status: DC

## 2022-08-10 NOTE — Telephone Encounter (Signed)
Addressed in a separate encounter

## 2022-08-19 ENCOUNTER — Other Ambulatory Visit: Payer: Self-pay | Admitting: Family Medicine

## 2022-10-11 ENCOUNTER — Other Ambulatory Visit: Payer: Self-pay | Admitting: Family Medicine

## 2022-11-01 ENCOUNTER — Ambulatory Visit: Payer: 59 | Admitting: Internal Medicine

## 2022-11-01 NOTE — Progress Notes (Deleted)
Patient ID: Alex Rocha, male   DOB: [birth year], 67 y.o.   MRN: [id number]  HPI: Alex Rocha is a 67 y.o.-year-old male, returning for follow-up for DM2, dx in 2000, insulin-dependent since 2014, controlled, with complications (cerebellar CVA, CKD stage III, peripheral neuropathy) and also thyroid nodule and post ablative hypothyroidism. previously saw Dr. Everardo All, but last visit with me 4 months ago.  Interim history: No increased urination, blurry vision, nausea, chest pain.  DM2: Reviewed HbA1c: Lab Results  Component Value Date   HGBA1C 6.9 (A) 06/22/2022   HGBA1C 6.8 (H) 03/31/2022   HGBA1C 6.4 10/25/2021   HGBA1C 6.0 (A) 08/04/2021   HGBA1C 5.9 (A) 03/31/2021   HGBA1C 5.7 (A) 11/29/2020   HGBA1C 7.1 (H) 09/14/2020   HGBA1C 6.9 (A) 07/09/2020   HGBA1C 6.3 03/09/2020   HGBA1C 5.9 10/02/2019   Pt is on a regimen of: - NPH 80 units in a.m. and 60 units at night - Jardiance 10 mg before breakfast-started 06/2022 Also on cinnamon 500 mg daily. He tried Metformin in the past - tolerated well, from what he can remember  Pt checks his sugars >4x a day a day and they are:  Previously:    Lowest sugar was 51 - seldom; he has hypoglycemia awareness at 70.  Highest sugar was 250.  Glucometer: none  Pt's meals are: - Breakfast:  fruit - Lunch: salad , sandwich - Dinner: meat (chicken or grilled pork or beef) + starch (low carb pasta) + veggies; veggie burgers; may eat out - Snacks: no sweet drinks; crackers or cookies  - + stage 3 CKD, last BUN/creatinine:  Lab Results  Component Value Date   BUN 42 (H) 03/31/2022   BUN 44 (H) 10/25/2021   CREATININE 1.65 (H) 03/31/2022   CREATININE 1.71 (H) 10/25/2021  On Cozaar 100 mg daily.  -+ HL; last set of lipids: Lab Results  Component Value Date   CHOL 168 03/31/2022   HDL 42.80 03/31/2022   LDLCALC 105 (H) 03/31/2022   LDLDIRECT 107.0 06/10/2020   TRIG 101.0 03/31/2022   CHOLHDL 4 03/31/2022  On fenofibrate  160 mg daily, omega-3 fatty acids.  - last eye exam was in 09/21/2021. No DR reportedly. He had cataract Sx - summer 2023.  - + numbness and tingling in his feet. Last foot exam 03/2022.  Thyroid nodule:  Thyroid U/S (01/01/2013): Right thyroid lobe: 6.6 x 2.9 x 2.9 cm several rounded nodules are present  within the right lobe. The largest is a 2.3 x 1.6 cm solid nodule in  the mid right thyroid gland.   Left thyroid lobe: 5.7 x 2.4 x 2.4 cm.  No nodules visualized.   Isthmus Thickness: 0.4 cm   No nodules visualized.   Lymphadenopathy: None visualized.   IMPRESSION:  1. Multiple nodules within the right lobe of thyroid gland. The  largest nodule measures 2.3 cm and is solid. Findings meet consensus  criteria for biopsy.   Thyroid uptake and scan (01/21/2013): Normal 24 hr radio iodine uptake of 17.3%.  Dominant right lobe nodule measuring 2.3 cm diameter on ultrasound is cold on scintigraphy.  Malignancy not excluded.   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  Solitary 15 x 17 x 16 mm hypoechoic solid nodule in the lower pole of the right gland. This has decreased in size compared to 23 x 17 x 16 mm previously. The background thyroid parenchyma  is mildly heterogeneous.   Left thyroid lobe: 3.8 x 1.3 x 1.5 cm. Small and relatively heterogeneous thyroid gland. No discrete nodule.   Isthmus Thickness: 0.5 cm.  No nodules visualized.   Lymphadenopathy: None visualized.   IMPRESSION: 1. Interval involution of the previously noted right lower pole thyroid nodule which presently measures a maximum of 17 mm compared to 23 mm on 01/01/2013. Decrease in size over time is highly consistent with a benign process. 2. No additional thyroid nodules identified. 3. The background thyroid gland is mildly heterogeneous.  Pt denies: - feeling nodules in neck - hoarseness - dysphagia -  choking  Post ablative hypothyroidism  Pt is on levothyroxine 112 mcg daily, taken: - in am - fasting - at least 30 min from b'fast - no calcium - no iron - no multivitamins - no PPIs - not on Biotin  Reviewed his TSH levels: Lab Results  Component Value Date   TSH 1.89 03/31/2022   TSH 1.59 10/25/2021   TSH 0.65 09/29/2021   TSH 1.82 12/10/2020   TSH 2.49 06/10/2020   TSH 2.42 03/09/2020   TSH 1.47 10/02/2019   TSH 0.76 09/11/2019   TSH 5.02 (H) 08/08/2019   TSH 3.62 05/29/2019   TSH 3.25 02/07/2019   TSH 1.58 12/04/2017   TSH 2.08 08/14/2016   TSH 2.50 06/18/2015   TSH 4.060 04/24/2014   TSH 2.70 03/09/2014   TSH 2.69 12/08/2013   TSH 5.04 (H) 08/25/2013   TSH 0.05 (L) 06/26/2013   TSH 0.05 (L) 06/18/2013   No FH of thyroid cancer. No h/o radiation tx to head or neck. No herbal supplements. No Biotin use. No recent steroids use.   He also has a history of OSA, HTN, prostate cancer, kidney stones, low testosterone, cataract.  ROS: + see HPI  Past Medical History:  Diagnosis Date   Anemia    Cataract    Colon polyps    Diabetes mellitus    Environmental allergies    Hyperlipidemia    Hypertension    Hyperthyroidism    Kidney stones    Low testosterone    Prostate cancer [facility])    Sleep apnea    Past Surgical History:  Procedure Laterality Date   CATARACT EXTRACTION Left 09/2021   CATARACT EXTRACTION Right 11/2021   PROSTATE BIOPSY     VASECTOMY     Social History   Socioeconomic History   Marital status: Married    Spouse name: Not on file   Number of children: 2   Years of education: Not on file   Highest education level: Not on file  Occupational History   Occupation: Location manager  Tobacco Use   Smoking status: Never   Smokeless tobacco: Never  Vaping Use   Vaping status: Never Used  Substance and Sexual Activity   Alcohol use: Yes    Comment: rare - on social occasions   Drug use: No   Sexual activity: Yes  Other  Topics Concern   Not on file  Social History Narrative   Exercise----not as much--- 1-2 x a week    Social Determinants of Health   Financial Resource Strain: Not on file  Food Insecurity: Not on file  Transportation Needs: Not on file  Physical Activity: Not on file  Stress: Not on file  Social Connections: Not on file  Intimate Partner Violence: Not on file   Current Outpatient Medications on File Prior to Visit  Medication Sig Dispense Refill   amoxicillin-clavulanate (  AUGMENTIN) 875-125 MG tablet Take 1 tablet by mouth 2 (two) times daily. 20 tablet 0   Cinnamon 500 MG capsule Take 500 mg by mouth daily.     co-enzyme Q-10 30 MG capsule Take 30 mg by mouth 3 (three) times daily.     Continuous Glucose Sensor (DEXCOM G6 SENSOR) MISC 1 each by Does not apply route See admin instructions. Change every 10 days 9 each 3   Continuous Glucose Transmitter (DEXCOM G6 TRANSMITTER) MISC Use as Directed 1 each 3   diclofenac sodium (VOLTAREN) 1 % GEL APPLY 4 G TOPICALLY 4 (FOUR) TIMES DAILY. 100 g 1   empagliflozin (JARDIANCE) 10 MG TABS tablet Take 1 tablet (10 mg total) by mouth daily before breakfast. 90 tablet 3   fenofibrate 160 MG tablet Take 1 tablet (160 mg total) by mouth daily. 90 tablet 0   fluticasone (FLONASE) 50 MCG/ACT nasal spray Place 2 sprays into both nostrils daily. 16 g 6   insulin NPH Human (HUMULIN N) 100 UNIT/ML injection 80 units each morning, and 60 units each evening. 140 mL 1   Insulin Syringe-Needle U-100 (BD INSULIN SYRINGE U/F) 31G X 5/16" 1 ML MISC 1 EACH BY OTHER ROUTE 2 (TWO) TIMES DAILY. AS DIRECTED 200 each 3   levothyroxine (SYNTHROID) 112 MCG tablet TAKE 1 TABLET BY MOUTH EVERY DAY 30 tablet 5   loratadine (CLARITIN) 10 MG tablet TAKE 1 TABLET BY MOUTH EVERY DAY 90 tablet 1   losartan (COZAAR) 100 MG tablet Take 1 tablet (100 mg total) by mouth daily. Pt needs office visit for further refills 30 tablet 0   Omega 3-6-9 Fatty Acids (OMEGA 3-6-9 COMPLEX PO)  Take by mouth.     tamsulosin (FLOMAX) 0.4 MG CAPS capsule Take 0.4 mg by mouth daily.     Turmeric 1053 MG TABS Take by mouth.     No current facility-administered medications on file prior to visit.   Allergies  Allergen Reactions   [name] [Yohimbine] Palpitations   Family History  Problem Relation Age of Onset   Colon cancer Mother    Heart disease Mother    Hypertension Mother        Mother's side of the family   Diabetes Mother        Mother's side of the family   Hyperlipidemia Sister    Hypertension Sister    Diabetes Sister    Heart disease Sister    Osteoarthritis Sister    Diabetes Sister    Colon cancer Brother    Cancer Paternal Aunt        unknown type   Rectal cancer Other    Stomach cancer Other    Heart disease Other    Diabetes Other        father's family   Breast cancer Neg Hx    Pancreatic cancer Neg Hx    PE: There were no vitals taken for this visit. Wt Readings from Last 3 Encounters:  06/22/22 (!) 302 lb 9.6 oz (137.3 kg)  05/19/22 (!) 301 lb (136.5 kg)  03/31/22 291 lb (132 kg)   Constitutional: overweight, in NAD Eyes:  EOMI, + B exophthalmos ENT: no neck masses, no cervical lymphadenopathy Cardiovascular: RRR, No MRG Respiratory: CTA B Musculoskeletal: no deformities Skin:no rashes Neurological: no tremor with outstretched hands  ASSESSMENT: 1. DM2, insulin-dependent, controlled, with complications - CVA -cerebellum -seen on MRI 04/26/2022 - CKD stage 3 - PN  2. Right thyroid nodule  3. Post ablative hypothyroidism  PLAN:  1. Patient with longstanding, fairly well-controlled type 2 diabetes, on intermediate acting insulin and now SGLT2 inhibitor added at last visit.  At that time, HbA1c was 6.9%, higher.  Sugars were worse in the 2-week prior to the appointment, compared to the previous 2 weeks.  They were increasing after every meal.  He mentions that he was eating out more.  We discussed about the need to reduce eating out but  he also agreed to add Jardiance after discussion about cardiovascular and renal benefits. CGM interpretation: -At today's visit, we reviewed his CGM downloads: It appears that *** of values are in target range (goal >70%), while *** are higher than 180 (goal <25%), and *** are lower than 70 (goal <4%).  The calculated average blood sugar is ***.  The projected HbA1c for the next 3 months (GMI) is ***. -Reviewing the CGM trends, ***  - I suggested to:  Patient Instructions  Please continue: - NPH 80 units in a.m. and 60 units at night - Jardiance 10 mg before b'fast  Please continue Levothyroxine 112 mcg daily.  Take the thyroid hormone every day, with water, at least 30 minutes before breakfast, separated by at least 4 hours from: - acid reflux medications - calcium - iron - multivitamins  Please return for another visit in 4 months.   - we checked his HbA1c: 7%  - advised to check sugars at different times of the day - 4x a day, rotating check times - advised for yearly eye exams >> he is UTD - return to clinic in 4 months  2. Right thyroid nodule -With serial benign biopsy (see HPI) -He had RAI treatment and afterwards the size of his thyroid gland and also his thyroid nodule decreased -A follow-up ultrasound from 2016 showed a much smaller right nodule, with no need for follow-up -No neck compression symptoms or neck masses felt on palpation of his neck today  3. Post ablative hypothyroidism - latest thyroid labs reviewed with pt. >> normal: Lab Results  Component Value Date   TSH 1.89 03/31/2022  - he continues on LT4 112 mcg daily - pt feels good on this dose. - we discussed about taking the thyroid hormone every day, with water, >30 minutes before breakfast, separated by >4 hours from acid reflux medications, calcium, iron, multivitamins. Pt. is taking it correctly. - will check thyroid tests at next visit  Carlus Pavlov, MD PhD [facility]

## 2022-11-02 LAB — LAB REPORT - SCANNED
Creatinine, POC: 95.3 mg/dL
EGFR: 38

## 2022-11-04 ENCOUNTER — Other Ambulatory Visit: Payer: Self-pay | Admitting: Family Medicine

## 2022-11-06 ENCOUNTER — Encounter: Payer: Self-pay | Admitting: *Deleted

## 2022-11-08 ENCOUNTER — Encounter: Payer: Self-pay | Admitting: Physician Assistant

## 2022-11-08 ENCOUNTER — Other Ambulatory Visit: Payer: Self-pay | Admitting: Family Medicine

## 2022-11-14 ENCOUNTER — Other Ambulatory Visit: Payer: Self-pay | Admitting: Family Medicine

## 2022-11-21 ENCOUNTER — Telehealth: Payer: Self-pay | Admitting: Family Medicine

## 2022-11-21 NOTE — Telephone Encounter (Signed)
**  Pt is scheduled for 6 Month Follow Up on 8.9.24**  Prescription Request  11/21/2022  Is this a "Controlled Substance" medicine? No  LOV: Visit date not found  What is the name of the medication or equipment?   levothyroxine (SYNTHROID) 112 MCG tablet [[id number]]  Have you contacted your pharmacy to request a refill? Yes   Which pharmacy would you like this sent to?  [id number] [address] [place] [address] Phone: [phone] Fax: [phone]    Patient notified that their request is being sent to the clinical staff for review and that they should receive a response within 2 business days.   Please advise at Mobile [phone] (mobile)

## 2022-11-22 MED ORDER — LEVOTHYROXINE SODIUM 112 MCG PO TABS: 112.0000 ug | ORAL_TABLET | Freq: Every day | ORAL | 0 refills | Status: DC

## 2022-11-22 NOTE — Telephone Encounter (Signed)
Refills sent

## 2022-11-22 NOTE — Addendum Note (Signed)
Addended by: Roxanne Gates on: 11/22/2022 08:54 AM   Modules accepted: Orders

## 2022-11-24 ENCOUNTER — Encounter: Payer: Self-pay | Admitting: Family Medicine

## 2022-11-24 ENCOUNTER — Ambulatory Visit: Payer: 59 | Admitting: Family Medicine

## 2022-11-24 VITALS — BP 124/78 | HR 70 | Temp 98.5°F | Resp 18 | Ht 74.0 in | Wt 297.6 lb

## 2022-11-24 DIAGNOSIS — I10 Essential (primary) hypertension: Secondary | ICD-10-CM | POA: Diagnosis not present

## 2022-11-24 DIAGNOSIS — E785 Hyperlipidemia, unspecified: Secondary | ICD-10-CM

## 2022-11-24 DIAGNOSIS — E890 Postprocedural hypothyroidism: Secondary | ICD-10-CM | POA: Diagnosis not present

## 2022-11-24 DIAGNOSIS — E1169 Type 2 diabetes mellitus with other specified complication: Secondary | ICD-10-CM | POA: Diagnosis not present

## 2022-11-24 DIAGNOSIS — N1832 Chronic kidney disease, stage 3b: Secondary | ICD-10-CM

## 2022-11-24 DIAGNOSIS — Z7984 Long term (current) use of oral hypoglycemic drugs: Secondary | ICD-10-CM

## 2022-11-24 LAB — COMPREHENSIVE METABOLIC PANEL
ALT: 31 U/L (ref 0–53)
AST: 32 U/L (ref 0–37)
Albumin: 4.7 g/dL (ref 3.5–5.2)
Alkaline Phosphatase: 38 U/L — ABNORMAL LOW (ref 39–117)
BUN: 44 mg/dL — ABNORMAL HIGH (ref 6–23)
CO2: 26 mEq/L (ref 19–32)
Calcium: 9.4 mg/dL (ref 8.4–10.5)
Chloride: 103 mEq/L (ref 96–112)
Creatinine, Ser: 1.56 mg/dL — ABNORMAL HIGH (ref 0.40–1.50)
GFR: 46.53 mL/min — ABNORMAL LOW (ref >60.00)
Glucose, Bld: 122 mg/dL — ABNORMAL HIGH (ref 70–99)
Potassium: 4.8 mEq/L (ref 3.5–5.1)
Sodium: 136 mEq/L (ref 135–145)
Total Bilirubin: 0.4 mg/dL (ref 0.2–1.2)
Total Protein: 7.2 g/dL (ref 6.0–8.3)

## 2022-11-24 LAB — CBC WITH DIFFERENTIAL/PLATELET
Basophils Absolute: 0.1 10*3/uL (ref 0.0–0.1)
Basophils Relative: 1.2 % (ref 0.0–3.0)
Eosinophils Absolute: 0.2 10*3/uL (ref 0.0–0.7)
Eosinophils Relative: 3.2 % (ref 0.0–5.0)
HCT: 38.2 % — ABNORMAL LOW (ref 39.0–52.0)
Hemoglobin: 12.3 g/dL — ABNORMAL LOW (ref 13.0–17.0)
Lymphocytes Relative: 37.3 % (ref 12.0–46.0)
Lymphs Abs: 1.8 10*3/uL (ref 0.7–4.0)
MCHC: 32.3 g/dL (ref 30.0–36.0)
MCV: 94.5 fl (ref 78.0–100.0)
Monocytes Absolute: 0.5 10*3/uL (ref 0.1–1.0)
Monocytes Relative: 9.6 % (ref 3.0–12.0)
Neutro Abs: 2.4 10*3/uL (ref 1.4–7.7)
Neutrophils Relative %: 48.7 % (ref 43.0–77.0)
Platelets: 274 10*3/uL (ref 150.0–400.0)
RBC: 4.04 Mil/uL — ABNORMAL LOW (ref 4.22–5.81)
RDW: 13.9 % (ref 11.5–15.5)
WBC: 4.9 10*3/uL (ref 4.0–10.5)

## 2022-11-24 LAB — LIPID PANEL
Cholesterol: 178 mg/dL (ref 0–200)
HDL: 35.5 mg/dL — ABNORMAL LOW (ref >39.00)
LDL Cholesterol: 111 mg/dL — ABNORMAL HIGH (ref 0–99)
NonHDL: 142.56
Total CHOL/HDL Ratio: 5
Triglycerides: 157 mg/dL — ABNORMAL HIGH (ref 0.0–149.0)
VLDL: 31.4 mg/dL (ref 0.0–40.0)

## 2022-11-24 LAB — TSH: TSH: 2.3 u[IU]/mL (ref 0.35–5.50)

## 2022-11-24 NOTE — Assessment & Plan Note (Signed)
Well controlled, no changes to meds. Encouraged heart healthy diet such as the DASH diet and exercise as tolerated.  °

## 2022-11-24 NOTE — Progress Notes (Signed)
Established Patient Office Visit  Subjective   Patient ID: JO SELLARDS, male    DOB: August 05, 1955  Age: 67 y.o. MRN: [id number]  Chief Complaint  Patient presents with   Hypertension   Hyperlipidemia   Hypothyroidism   Follow-up    HPI Discussed the use of AI scribe software for clinical note transcription with the patient, who gave verbal consent to proceed.  History of Present Illness   The patient, with a history of hypertension and diabetes, presents for a routine follow-up and medication refill. He expresses [facility] for not [facility] medication refills, resulting in multiple trips to the pharmacy each month. He plans to switch to a mail-order pharmacy upon transitioning to Medicare next month. The patient is also considering the mail-order service offered by the pharmacy at [facility], which can synchronize medication refills and provide pill packs.  The patient continues to use the Dexcom device for diabetes management and reports satisfaction with the device. He sees Dr. Fayrene Helper for diabetes management and has regular finger stick glucose checks, negating the need for an A1c test during this visit.  The patient also reports having cataract surgery last August and plans to schedule a follow-up appointment with his eye doctor this week.      Patient Active Problem List   Diagnosis Date Noted   Preventative health care 12/10/2020   Anemia 10/19/2020   Malignant neoplasm of prostate (HCC) 05/11/2020   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   Uncontrolled type 2 diabetes mellitus with hyperglycemia (HCC) 10/02/2019   Urinary frequency 10/02/2019   Chills (without fever) 10/02/2019   Dysuria 09/19/2019   Stage 3b chronic kidney disease (HCC) 09/19/2019   Urinary tract infection without hematuria 08/08/2019   ETD (eustachian tube  dysfunction) 06/08/2018   Acute left-sided low back pain with left-sided sciatica 05/22/2017   Tingling in extremities 08/14/2016   Arthralgia 08/14/2016   Arthritis 05/29/2016   De Quervain's tenosynovitis, right 05/29/2016   Sleep apnea 12/06/2015   Type 2 diabetes mellitus without complication, without long-term current use of insulin (HCC) 06/19/2015   Light headedness 01/11/2015   Gastroesophageal reflux disease without esophagitis 02/13/2014   Gout 01/27/2014   Hypothyroidism following radioiodine therapy 12/08/2013   Sinusitis 08/25/2013   Multinodular goiter 01/22/2013   HTN (hypertension) 11/25/2012   Hyperlipidemia LDL goal <70 11/25/2012   Left otitis media 11/25/2012   Low testosterone 11/25/2012   Past Medical History:  Diagnosis Date   Anemia    Cataract    Colon polyps    Diabetes mellitus    Environmental allergies    Hyperlipidemia    Hypertension    Hyperthyroidism    Kidney stones    Low testosterone    Prostate cancer (HCC)    Sleep apnea    Past Surgical History:  Procedure Laterality Date   CATARACT EXTRACTION Left 09/2021   CATARACT EXTRACTION Right 11/2021   PROSTATE BIOPSY     VASECTOMY     Social History   Tobacco Use   Smoking status: Never   Smokeless tobacco: Never  Vaping Use   Vaping status: Never Used  Substance Use Topics   Alcohol use: Yes    Comment: rare - on social occasions  Drug use: No   Social History   Socioeconomic History   Marital status: Married    Spouse name: Not on file   Number of children: 2   Years of education: Not on file   Highest education level: Not on file  Occupational History   Occupation: Location manager  Tobacco Use   Smoking status: Never   Smokeless tobacco: Never  Vaping Use   Vaping status: Never Used  Substance and Sexual Activity   Alcohol use: Yes    Comment: rare - on social occasions   Drug use: No   Sexual activity: Yes  Other Topics Concern   Not on file   Social History Narrative   Exercise----not as much--- 1-2 x a week    Social Determinants of Health   Financial Resource Strain: Not on file  Food Insecurity: Not on file  Transportation Needs: Not on file  Physical Activity: Not on file  Stress: Not on file  Social Connections: Not on file  Intimate Partner Violence: Not on file   Family Status  Relation Name Status   Mother  Deceased at age 42       chf   Father  Deceased at age 93   Sister deb Alive   Sister  Alive   Brother  English as a second language teacher  (Not Specified)   Other  (Not Specified)   Neg Hx  (Not Specified)  No partnership data on file   Family History  Problem Relation Age of Onset   Colon cancer Mother    Heart disease Mother    Hypertension Mother        Mother's side of the family   Diabetes Mother        Mother's side of the family   Hyperlipidemia Sister    Hypertension Sister    Diabetes Sister    Heart disease Sister    Osteoarthritis Sister    Diabetes Sister    Colon cancer Brother    Cancer Paternal Aunt        unknown type   Rectal cancer Other    Stomach cancer Other    Heart disease Other    Diabetes Other        father's family   Breast cancer Neg Hx    Pancreatic cancer Neg Hx    Allergies  Allergen Reactions   [name] [Yohimbine] Palpitations      Review of Systems  Constitutional:  Negative for fever and malaise/fatigue.  HENT:  Negative for congestion.   Eyes:  Negative for blurred vision.  Respiratory:  Negative for cough and shortness of breath.   Cardiovascular:  Negative for chest pain, palpitations and leg swelling.  Gastrointestinal:  Negative for vomiting.  Musculoskeletal:  Negative for back pain.  Skin:  Negative for rash.  Neurological:  Negative for loss of consciousness and headaches.      Objective:     BP 124/78 (BP Location: Left Arm, Patient Position: Sitting, Cuff Size: Large)   Pulse 70   Temp 98.5 F (36.9 C) (Oral)   Resp 18   Ht 6\' 2"  (1.88 m)    Wt 297 lb 9.6 oz (135 kg)   SpO2 96%   BMI 38.21 kg/m  BP Readings from Last 3 Encounters:  11/24/22 124/78  06/22/22 136/66  05/19/22 138/80   Wt Readings from Last 3 Encounters:  11/24/22 297 lb 9.6 oz (135 kg)  06/22/22 (!) 302 lb 9.6 oz (137.3 kg)  05/19/22 Marland Kitchen)  301 lb (136.5 kg)   SpO2 Readings from Last 3 Encounters:  11/24/22 96%  06/22/22 97%  05/19/22 99%      Physical Exam Vitals and nursing note reviewed.  Constitutional:      General: He is not in acute distress.    Appearance: Normal appearance. He is well-developed.  HENT:     Head: Normocephalic and atraumatic.  Eyes:     General: No scleral icterus.       Right eye: No discharge.        Left eye: No discharge.  Cardiovascular:     Rate and Rhythm: Normal rate and regular rhythm.     Heart sounds: No murmur heard. Pulmonary:     Effort: Pulmonary effort is normal. No respiratory distress.     Breath sounds: Normal breath sounds.  Musculoskeletal:        General: Normal range of motion.     Cervical back: Normal range of motion and neck supple.     Right lower leg: No edema.     Left lower leg: No edema.  Skin:    General: Skin is warm and dry.  Neurological:     Mental Status: He is alert and oriented to person, place, and time.  Psychiatric:        Mood and Affect: Mood normal.        Behavior: Behavior normal.        Thought Content: Thought content normal.        Judgment: Judgment normal.      No results found for any visits on 11/24/22.  Last CBC Lab Results  Component Value Date   WBC 5.6 03/31/2022   HGB 11.9 (L) 03/31/2022   HCT 35.3 (L) 03/31/2022   MCV 94.0 03/31/2022   MCH 31.1 08/01/2019   RDW 13.1 03/31/2022   PLT 252.0 03/31/2022   Last metabolic panel Lab Results  Component Value Date   GLUCOSE 110 (H) 03/31/2022   NA 140 03/31/2022   K 5.0 03/31/2022   CL 106 03/31/2022   CO2 26 03/31/2022   BUN 42 (H) 03/31/2022   CREATININE 1.65 (H) 03/31/2022   EGFR  38.0 11/02/2022   CALCIUM 10.1 03/31/2022   PROT 7.4 03/31/2022   ALBUMIN 4.6 03/31/2022   BILITOT 0.4 03/31/2022   ALKPHOS 35 (L) 03/31/2022   AST 26 03/31/2022   ALT 28 03/31/2022   ANIONGAP 8 08/01/2019   Last lipids Lab Results  Component Value Date   CHOL 168 03/31/2022   HDL 42.80 03/31/2022   LDLCALC 105 (H) 03/31/2022   LDLDIRECT 107.0 06/10/2020   TRIG 101.0 03/31/2022   CHOLHDL 4 03/31/2022   Last hemoglobin A1c Lab Results  Component Value Date   HGBA1C 6.9 (A) 06/22/2022   Last thyroid functions Lab Results  Component Value Date   TSH 1.89 03/31/2022   T4TOTAL 6.0 04/24/2014   Last vitamin D No results found for: "25OHVITD2", "25OHVITD3", "VD25OH" Last vitamin B12 and Folate Lab Results  Component Value Date   VITAMINB12 1,487 (H) 10/25/2021      The 10-year ASCVD risk score (Arnett DK, et al., 2019) is: 26%    Assessment & Plan:   Problem List Items Addressed This Visit       Unprioritized   Stage 3b chronic kidney disease (HCC)   Relevant Orders   Comprehensive metabolic panel   Hypothyroidism following radioiodine therapy    Check labs  Con't synthroid      Relevant  Orders   TSH   Hyperlipidemia LDL goal <70    Tolerating statin, encouraged heart healthy diet, avoid trans fats, minimize simple carbs and saturated fats. Increase exercise as tolerated       HTN (hypertension) - Primary    Well controlled, no changes to meds. Encouraged heart healthy diet such as the DASH diet and exercise as tolerated.         Relevant Orders   CBC with Differential/Platelet   Comprehensive metabolic panel   Other Visit Diagnoses     Hyperlipidemia, unspecified hyperlipidemia type       Hyperlipidemia associated with type 2 diabetes mellitus (HCC)       Relevant Orders   Comprehensive metabolic panel   Lipid panel     Assessment and Plan    Medication Management Difficulty with [facility] at [facility]. Considering mail order  pharmacy. -Consider using [facility] mail order pharmacy for convenience and synchronization.  Diabetes Management Using Dexcom for glucose monitoring. No recent A1C due to finger stick monitoring by Dr. Fayrene Helper. -Continue current management with Dr. Fayrene Helper.  Eye Health Cataract surgery in August 2023, overdue for follow-up. -Schedule appointment with eye doctor for post-operative follow-up.  General Health Maintenance No current complaints or concerns. -Return in six months for physical unless issues arise before then.        No follow-ups on file.    Donato Schultz, DO

## 2022-11-24 NOTE — Assessment & Plan Note (Signed)
Check labs  Con't synthroid 

## 2022-11-24 NOTE — Assessment & Plan Note (Signed)
Tolerating statin, encouraged heart healthy diet, avoid trans fats, minimize simple carbs and saturated fats. Increase exercise as tolerated 

## 2022-12-20 ENCOUNTER — Other Ambulatory Visit: Payer: Self-pay | Admitting: Family Medicine

## 2022-12-29 ENCOUNTER — Ambulatory Visit: Payer: Medicare Other | Admitting: Internal Medicine

## 2022-12-29 ENCOUNTER — Encounter: Payer: Self-pay | Admitting: Internal Medicine

## 2022-12-29 VITALS — BP 124/70 | HR 73 | Ht 74.0 in | Wt 298.8 lb

## 2022-12-29 DIAGNOSIS — Z794 Long term (current) use of insulin: Secondary | ICD-10-CM | POA: Diagnosis not present

## 2022-12-29 DIAGNOSIS — E890 Postprocedural hypothyroidism: Secondary | ICD-10-CM | POA: Diagnosis not present

## 2022-12-29 DIAGNOSIS — Z7984 Long term (current) use of oral hypoglycemic drugs: Secondary | ICD-10-CM | POA: Diagnosis not present

## 2022-12-29 DIAGNOSIS — E119 Type 2 diabetes mellitus without complications: Secondary | ICD-10-CM

## 2022-12-29 LAB — POCT GLYCOSYLATED HEMOGLOBIN (HGB A1C): Hemoglobin A1C: 6.3 % — AB (ref 4.0–5.6)

## 2022-12-29 MED ORDER — DEXCOM G7 SENSOR MISC: 3.0000 | 4 refills | Status: DC

## 2022-12-29 NOTE — Progress Notes (Signed)
Patient ID: Alex Rocha, male   DOB: [birth year], 67 y.o.   MRN: [id number]  HPI: Alex Rocha is a 67 y.o.-year-old male, returning for follow-up for DM2, dx in 2000, insulin-dependent since 2014, controlled, with complications (cerebellar CVA, CKD stage III, peripheral neuropathy) and also thyroid nodule and post ablative hypothyroidism. previously saw Dr. Everardo All, last visit with me 6 months ago.  Interim history: No increased urination, blurry vision, nausea, chest pain. He started to exercise >1 mo ago. Now has some R knee pain - did not exercise this week.  DM2: Reviewed HbA1c: Lab Results  Component Value Date   HGBA1C 6.9 (A) 06/22/2022   HGBA1C 6.8 (H) 03/31/2022   HGBA1C 6.4 10/25/2021   HGBA1C 6.0 (A) 08/04/2021   HGBA1C 5.9 (A) 03/31/2021   HGBA1C 5.7 (A) 11/29/2020   HGBA1C 7.1 (H) 09/14/2020   HGBA1C 6.9 (A) 07/09/2020   HGBA1C 6.3 03/09/2020   HGBA1C 5.9 10/02/2019   Pt is on a regimen of: - NPH 80 units in a.m. and 60 >> 0-40 units at night  - Jardiance 10 mg before breakfast - started 06/2022 Also on cinnamon 500 mg daily. He tried Metformin in the past - tolerated well, from what he can remember  Pt checks his sugars >4x a day a day and they are:  Previously:   Lowest sugar was 51 - seldom; he has hypoglycemia awareness at 70.  Highest sugar was 250.  Glucometer: none  Pt's meals are: - Breakfast:  fruit - Lunch: salad , sandwich - Dinner: meat (chicken or grilled pork or beef) + starch (low carb pasta) + veggies; veggie burgers; may eat out - Snacks: no sweet drinks; crackers or cookies  - + stage 3 CKD, last BUN/creatinine:  Lab Results  Component Value Date   BUN 44 (H) 11/24/2022   BUN 42 (H) 03/31/2022   CREATININE 1.56 (H) 11/24/2022   CREATININE 1.65 (H) 03/31/2022   Lab Results  Component Value Date   MICRALBCREAT 0.9 03/31/2022   MICRALBCREAT 9 10/19/2021   MICRALBCREAT 0.7 09/29/2021   MICRALBCREAT 0.9 09/14/2020    MICRALBCREAT 0.6 06/10/2020   MICRALBCREAT 0.6 10/02/2019   MICRALBCREAT 4 09/05/2019   MICRALBCREAT 0.6 05/22/2017   MICRALBCREAT 2 06/18/2015   MICRALBCREAT 0.5 06/12/2014  On Cozaar 100 mg daily.  -+ HL; last set of lipids: Lab Results  Component Value Date   CHOL 178 11/24/2022   HDL 35.50 (L) 11/24/2022   LDLCALC 111 (H) 11/24/2022   LDLDIRECT 107.0 06/10/2020   TRIG 157.0 (H) 11/24/2022   CHOLHDL 5 11/24/2022  On fenofibrate 160 mg daily, omega-3 fatty acids.  - last eye exam was in 12/25/2022. No DR reportedly. He had cataract Sx - summer 2023.  - + numbness and tingling in his feet. Last foot exam 03/2022.  Thyroid nodule:  Thyroid U/S (01/01/2013): Right thyroid lobe: 6.6 x 2.9 x 2.9 cm several rounded nodules are present  within the right lobe. The largest is a 2.3 x 1.6 cm solid nodule in  the mid right thyroid gland.   Left thyroid lobe: 5.7 x 2.4 x 2.4 cm.  No nodules visualized.   Isthmus Thickness: 0.4 cm   No nodules visualized.   Lymphadenopathy: None visualized.   IMPRESSION:  1. Multiple nodules within the right lobe of thyroid gland. The  largest nodule measures 2.3 cm and is solid. Findings meet consensus  criteria for biopsy.   Thyroid uptake and scan (01/21/2013): Normal 24 hr radio iodine  uptake of 17.3%.  Dominant right lobe nodule measuring 2.3 cm diameter on ultrasound is cold on scintigraphy.  Malignancy not excluded.   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  Solitary 15 x 17 x 16 mm hypoechoic solid nodule in the lower pole of the right gland. This has decreased in size compared to 23 x 17 x 16 mm previously. The background thyroid parenchyma is mildly heterogeneous.   Left thyroid lobe: 3.8 x 1.3 x 1.5 cm. Small and relatively heterogeneous thyroid gland. No discrete nodule.   Isthmus Thickness: 0.5 cm.  No nodules visualized.    Lymphadenopathy: None visualized.   IMPRESSION: 1. Interval involution of the previously noted right lower pole thyroid nodule which presently measures a maximum of 17 mm compared to 23 mm on 01/01/2013. Decrease in size over time is highly consistent with a benign process. 2. No additional thyroid nodules identified. 3. The background thyroid gland is mildly heterogeneous.  Pt denies: - feeling nodules in neck - hoarseness - dysphagia - choking  Post ablative hypothyroidism  Pt is on levothyroxine 112 mcg daily, taken: - in am - fasting - at least 30 min from b'fast - no calcium - no iron - no multivitamins - no PPIs - not on Biotin  Reviewed his TSH levels: Lab Results  Component Value Date   TSH 2.30 11/24/2022   TSH 1.89 03/31/2022   TSH 1.59 10/25/2021   TSH 0.65 09/29/2021   TSH 1.82 12/10/2020   TSH 2.49 06/10/2020   TSH 2.42 03/09/2020   TSH 1.47 10/02/2019   TSH 0.76 09/11/2019   TSH 5.02 (H) 08/08/2019   TSH 3.62 05/29/2019   TSH 3.25 02/07/2019   TSH 1.58 12/04/2017   TSH 2.08 08/14/2016   TSH 2.50 06/18/2015   TSH 4.060 04/24/2014   TSH 2.70 03/09/2014   TSH 2.69 12/08/2013   TSH 5.04 (H) 08/25/2013   TSH 0.05 (L) 06/26/2013   No FH of thyroid cancer. No h/o radiation tx to head or neck. No herbal supplements. No Biotin use. No recent steroids use.   He also has a history of OSA, HTN, prostate cancer, kidney stones, low testosterone, cataract.  ROS: + see HPI +  Past Medical History:  Diagnosis Date   Anemia    Cataract    Colon polyps    Diabetes mellitus    Environmental allergies    Hyperlipidemia    Hypertension    Hyperthyroidism    Kidney stones    Low testosterone    Prostate cancer [facility])    Sleep apnea    Past Surgical History:  Procedure Laterality Date   CATARACT EXTRACTION Left 09/2021   CATARACT EXTRACTION Right 11/2021   PROSTATE BIOPSY     VASECTOMY     Social History   Socioeconomic History   Marital  status: Married    Spouse name: Not on file   Number of children: 2   Years of education: Not on file   Highest education level: Not on file  Occupational History   Occupation: Location manager  Tobacco Use   Smoking status: Never   Smokeless tobacco: Never  Vaping Use   Vaping status: Never Used  Substance and Sexual Activity   Alcohol use: Yes    Comment: rare - on social occasions   Drug use: No   Sexual activity: Yes  Other Topics Concern   Not on file  Social History Narrative   Exercise----not as much--- 1-2 x a week    Social Determinants of Corporate investment banker Strain: Not on file  Food Insecurity: Not on file  Transportation Needs: Not on file  Physical Activity: Not on file  Stress: Not on file  Social Connections: Not on file  Intimate Partner Violence: Not on file   Current Outpatient Medications on File Prior to Visit  Medication Sig Dispense Refill   Cinnamon 500 MG capsule Take 500 mg by mouth daily.     co-enzyme Q-10 30 MG capsule Take 30 mg by mouth 3 (three) times daily.     Continuous Glucose Sensor (DEXCOM G6 SENSOR) MISC 1 each by Does not apply route See admin instructions. Change every 10 days 9 each 3   Continuous Glucose Transmitter (DEXCOM G6 TRANSMITTER) MISC Use as Directed 1 each 3   diclofenac sodium (VOLTAREN) 1 % GEL APPLY 4 G TOPICALLY 4 (FOUR) TIMES DAILY. 100 g 1   empagliflozin (JARDIANCE) 10 MG TABS tablet Take 1 tablet (10 mg total) by mouth daily before breakfast. 90 tablet 3   fenofibrate 160 MG tablet TAKE 1 TABLET BY MOUTH EVERY DAY 90 tablet 0   fluticasone (FLONASE) 50 MCG/ACT nasal spray Place 2 sprays into both nostrils daily. 16 g 6   insulin NPH Human (HUMULIN N) 100 UNIT/ML injection 80 units each morning, and 60 units each evening. 140 mL 1   Insulin Syringe-Needle U-100 (BD INSULIN SYRINGE U/F) 31G X 5/16" 1 ML MISC 1 EACH BY OTHER ROUTE 2 (TWO) TIMES DAILY. AS DIRECTED 200 each 3   levothyroxine  (SYNTHROID) 112 MCG tablet TAKE 1 TABLET (112 MCG TOTAL) BY MOUTH DAILY. PT NEEDS OFFICE VISIT FOR FURTHER REFILLS. 30 tablet 0   loratadine (CLARITIN) 10 MG tablet TAKE 1 TABLET BY MOUTH EVERY DAY 90 tablet 1   losartan (COZAAR) 100 MG tablet Take 1 tablet (100 mg total) by mouth daily. Pt needs office visit for further refills 30 tablet 0   Omega 3-6-9 Fatty Acids (OMEGA 3-6-9 COMPLEX PO) Take by mouth.     tamsulosin (FLOMAX) 0.4 MG CAPS capsule Take 0.4 mg by mouth daily.     Turmeric 1053 MG TABS Take by mouth.     No current facility-administered medications on file prior to visit.   Allergies  Allergen Reactions   [name] [Yohimbine] Palpitations   Family History  Problem Relation Age of Onset   Colon cancer Mother    Heart disease Mother    Hypertension Mother        Mother's side of the family   Diabetes Mother        Mother's side of the family   Hyperlipidemia Sister    Hypertension Sister    Diabetes Sister    Heart disease Sister    Osteoarthritis Sister    Diabetes Sister    Colon cancer Brother    Cancer Paternal Aunt        unknown type   Rectal cancer Other    Stomach cancer Other    Heart disease Other    Diabetes Other        father's family   Breast cancer Neg Hx    Pancreatic cancer Neg Hx    PE: BP 124/70   Pulse 73   Ht 6\' 2"  (1.88 m)   Wt 298 lb 12.8 oz (135.5 kg)   SpO2 96%   BMI 38.36 kg/m  Wt Readings from Last  3 Encounters:  12/29/22 298 lb 12.8 oz (135.5 kg)  11/24/22 297 lb 9.6 oz (135 kg)  06/22/22 (!) 302 lb 9.6 oz (137.3 kg)   Constitutional: overweight, in NAD Eyes:  EOMI, + B exophthalmos ENT: no neck masses, no cervical lymphadenopathy Cardiovascular: RRR, No MRG Respiratory: CTA B Musculoskeletal: no deformities Skin:no rashes Neurological: no tremor with outstretched hands  ASSESSMENT: 1. DM2, insulin-dependent, controlled, with complications - CVA -cerebellum -seen on MRI 04/26/2022 - CKD stage 3 - PN  2. Right  thyroid nodule  3. Post ablative hypothyroidism  PLAN:  1. Patient with longstanding, uncontrolled, diabetes, on oral antidiabetic regimen, with slightly worse control at last visit, when an HbA1c level returned 6.9%, increased.  At that time, reviewing his CGM tracings, sugars were worse in the 2 weeks prior to appointment compared to the previous 2 weeks.  He mentions eating out more with his wife and we discussed about trying to reduce this.  At that time, I also suggested better mealtime coverage with an SGLT2 inhibitor, Jardiance, 10 mg daily. CGM interpretation: -At today's visit, we reviewed his CGM downloads: It appears that 91% of values are in target range (goal >70%), while 8% are higher than 180 (goal <25%), and 1% are lower than 70 (goal <4%).  The calculated average blood sugar is 137.  The projected HbA1c for the next 3 months ([facility]MI) is 6.6%. -Reviewing the CGM trends, sugars appear to be improved from before, mostly fluctuating within the target range with only occasional mildly hyperglycemic values after meals.  He does mention that sugars started to improve after he started to exercise approximately a month ago.  This week he has not been able to exercise due to knee pain but plans to restart at the end of the week. He does mention that he sometimes has to skip the evening insulin in the days that he exercises, to avoid low blood sugars overnight.  He did reduce his evening dose and will reduce it even further.  I did advise him to continue to reduce the dose if he notices that sugars are still dropping overnight. - I suggested to:  Patient Instructions  Please change: - NPH 80 units in a.m. and 20-30 units at night  Continue: - Jardiance 10 mg before b'fast  Please continue Levothyroxine 112 mcg daily.  Take the thyroid hormone every day, with water, at least 30 minutes before breakfast, separated by at least 4 hours from: - acid reflux medications - calcium - iron -  multivitamins  Please return for another visit in 4 months.   - we checked his HbA1c: 6.3% (lower) - advised to check sugars at different times of the day - 4x a day, rotating check times - advised for yearly eye exams >> he is UTD - return to clinic in 4 months  2. Right thyroid nodule -With history of benign biopsy -see HPI -He had RAI treatment and the size of the nodule and the rest of the thyroid decreased afterwards. -Follow-up ultrasound from 2016 showed a much smaller R nodule, with no need for follow-up -No neck compression symptoms or neck masses felt on palpation of his neck today  3. Post ablative hypothyroidism - latest thyroid labs reviewed with pt. >> normal: Lab Results  Component Value Date   TSH 2.30 11/24/2022  - he continues on LT4 112 mcg daily - pt feels good on this dose. - we discussed about taking the thyroid hormone every day, with water, >30 minutes  before breakfast, separated by >4 hours from acid reflux medications, calcium, iron, multivitamins. Pt. is taking it correctly.  Carlus Pavlov, MD PhD [facility]

## 2022-12-29 NOTE — Patient Instructions (Addendum)
Please change: - NPH 80 units in a.m. and 20-30 units at night  Continue: - Jardiance 10 mg before b'fast  Please continue Levothyroxine 112 mcg daily.  Take the thyroid hormone every day, with water, at least 30 minutes before breakfast, separated by at least 4 hours from: - acid reflux medications - calcium - iron - multivitamins  Please return for another visit in 4 months.

## 2023-01-11 ENCOUNTER — Other Ambulatory Visit: Payer: Self-pay | Admitting: Family Medicine

## 2023-01-14 ENCOUNTER — Other Ambulatory Visit: Payer: Self-pay | Admitting: Family Medicine

## 2023-02-04 ENCOUNTER — Other Ambulatory Visit: Payer: Self-pay | Admitting: Family Medicine

## 2023-02-04 DIAGNOSIS — E119 Type 2 diabetes mellitus without complications: Secondary | ICD-10-CM

## 2023-02-04 DIAGNOSIS — E890 Postprocedural hypothyroidism: Secondary | ICD-10-CM

## 2023-02-09 DIAGNOSIS — G4733 Obstructive sleep apnea (adult) (pediatric): Secondary | ICD-10-CM | POA: Diagnosis not present

## 2023-02-27 ENCOUNTER — Encounter: Payer: Self-pay | Admitting: Family Medicine

## 2023-03-09 ENCOUNTER — Encounter: Payer: Self-pay | Admitting: Family Medicine

## 2023-03-09 ENCOUNTER — Other Ambulatory Visit: Payer: Self-pay | Admitting: Family Medicine

## 2023-03-09 MED ORDER — HYDROCORTISONE-ACETIC ACID 1-2 % OT SOLN: 5.0000 [drp] | Freq: Two times a day (BID) | OTIC | 0 refills | Status: AC

## 2023-03-17 ENCOUNTER — Other Ambulatory Visit: Payer: Self-pay | Admitting: Family Medicine

## 2023-04-12 ENCOUNTER — Encounter: Payer: Medicare Other | Attending: Psychology | Admitting: Psychology

## 2023-04-12 ENCOUNTER — Encounter: Payer: Self-pay | Admitting: Psychology

## 2023-04-12 DIAGNOSIS — E119 Type 2 diabetes mellitus without complications: Secondary | ICD-10-CM | POA: Insufficient documentation

## 2023-04-12 DIAGNOSIS — G4733 Obstructive sleep apnea (adult) (pediatric): Secondary | ICD-10-CM | POA: Diagnosis not present

## 2023-04-12 DIAGNOSIS — R413 Other amnesia: Secondary | ICD-10-CM | POA: Diagnosis not present

## 2023-04-12 DIAGNOSIS — Z794 Long term (current) use of insulin: Secondary | ICD-10-CM

## 2023-04-12 DIAGNOSIS — I10 Essential (primary) hypertension: Secondary | ICD-10-CM | POA: Diagnosis not present

## 2023-04-12 DIAGNOSIS — C61 Malignant neoplasm of prostate: Secondary | ICD-10-CM | POA: Diagnosis present

## 2023-04-12 NOTE — Progress Notes (Signed)
Neuropsychological Consultation   Patient:   Alex Rocha   DOB:   05-03-1955  MR Number:  [id number]  Location:  [facility]  [facility] A DEPT OF [facility] [address] [place], [place] [address] [place] [address] Dept: [phone]           Date of Service:   04/12/2023  Location of Service and Individuals present: Today's visit was conducted in my outpatient clinic office with the patient myself present.  Start Time:   1 PM End Time:   3 PM  Patient Consent and Confidentiality: Consent for service and limits of confidentiality were reviewed including the fact that the patient had been referred for neuropsychological evaluation by his treating physician with this evaluation to be conducted and provided to the referring physician as well as being made available in the patient's electronic medical records for other appropriate professionals to have access.  The patient consents to proceed with the evaluation and report.  Consent for Evaluation and Treatment:  Signed:  Yes Explanation of Privacy Policies:  Signed:  Yes Discussion of Confidentiality Limits:  Yes  Provider/Observer:  Arley Phenix, Psy.D.       Clinical Neuropsychologist       Billing Code/Service: 96116/96121  Chief Complaint:     Chief Complaint  Patient presents with   Memory Loss   Other    Geographic disorientation    Reason for Service:    Alex Rocha is a 67 year old male referred for neuropsychological evaluation by his primary care physician Seabron Spates, DO due to ongoing issues with memory particularly short-term semantic memory but also episodic memory and times of geographic disorientation.  Patient has a past medical history including prostate cancer, type 2 diabetes with insulin use and prior uncontrolled state and hyperglycemia, obstructive sleep apnea, stage III chronic kidney disease, obstructive  sleep apnea with compliant CPAP use, GERD, hypertension and hyperlipidemia.  The patient describes worsening of new learning and particular short-term memory that he has noticed to be apparently worsening over the past year or so.  The patient describes events in the prior year of driving down roads that he was very familiar with and without forgetting where he was going or being confused in any way that nothing would look "familiar" and he would struggle identifying where he is on his path destination.  The patient reports that it was more frequent last year but has continued but less frequent more recently.  Patient notes the biggest issue now is that his short-term memory "is shot."  The patient reports that he will forget paperwork, appointments, people's names or other factual information almost immediately.  Patient describes greater semantic memory difficulties and episodic memory difficulties.  Patient denies other cognitive changes and denies other visual spatial or visual constructional difficulties, changes in executive functioning, changes in attention related to encoding factors etc.  The patient was diagnosed with obstructive sleep apnea many years ago and uses a CPAP machine diligently.  Patient takes very few naps during the day but does describe periods of fatigue he relates to his anemia and difficulty staying asleep at night.  Patient denies a family history of progressive dementia type symptoms.  He is the youngest of his siblings and notes that he is having to spend more time helping his siblings and has had a recent loss in the family but denies significant emotional distress or excessive worry.  Patient does report history of concussions most of which resulted from playing football.  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  The patient reports that he had 1 significant concussive  events but lots of smaller concussive events.  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  The patient reports that he is writing down information or using other strategies to aid with his memory difficulties but his memory issues are inconvenient for his family as they are regularly having to help him find things and bring things to him that he left at home without remembering.  Patient does note that over the past year that he "no longer has the sensation of being lost and will travel before he realizes he might be going in the wrong direction."  Medical History:   Past Medical History:  Diagnosis Date   Anemia    Cataract    Colon polyps    Diabetes mellitus    Environmental allergies    Hyperlipidemia    Hypertension    Hyperthyroidism    Kidney stones    Low testosterone    Prostate cancer [facility])    Sleep apnea          Patient Active Problem List   Diagnosis Date Noted   Preventative health care 12/10/2020   Anemia 10/19/2020   Malignant neoplasm of prostate (HCC) 05/11/2020   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   Uncontrolled type 2 diabetes mellitus with hyperglycemia (HCC) 10/02/2019   Urinary frequency 10/02/2019   Chills (without fever) 10/02/2019   Dysuria 09/19/2019   Stage 3b chronic kidney disease (HCC) 09/19/2019   Urinary tract infection without hematuria 08/08/2019   ETD (eustachian tube dysfunction) 06/08/2018   Acute left-sided low back pain with left-sided sciatica 05/22/2017   Tingling in extremities 08/14/2016   Arthralgia 08/14/2016   Arthritis 05/29/2016   De Quervain's tenosynovitis, right 05/29/2016   Sleep apnea 12/06/2015   Type 2 diabetes mellitus without complication, without long-term current use of insulin (HCC) 06/19/2015   Light headedness 01/11/2015   Gastroesophageal reflux disease without esophagitis 02/13/2014   Gout 01/27/2014   Hypothyroidism  following radioiodine therapy 12/08/2013   Sinusitis 08/25/2013   Multinodular goiter 01/22/2013   HTN (hypertension) 11/25/2012   Hyperlipidemia LDL goal <70 11/25/2012   Left otitis media 11/25/2012   Low testosterone 11/25/2012    Onset and Duration of Symptoms: Symptoms began developing a little over a year ago with short-term memory changes and disturbance in identifying where he was when traveling.  Progression of Symptoms: Patient reports that he is not having his many issues with geographic orientation but is having worsening issues with new learning/short-term memory with semantic greater than episodic symptoms. Additional Tests and Measures from other records:  Neuroimaging Results: The patient had an MRI conducted on 04/26/2022 after identifying changes in mental status, short-term memory, episodes of confusion and past history of prostate cancer.  This MRI was interpreted by Alex Loge, DO.  Multifocal T2 flair hyperintense signal abnormalities were noted within the cerebral white matter, nonspecific but compatible with mild chronic small vessel ischemic disease.  There are also chronic microhemorrhages noted within the mid right frontal lobe and right temporal occipital junction.  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  **the findings noted in the MRI review would be  consistent with a sudden development of visual spatial and geographic disorientation due to right frontotemporoparietal junction region of the brain and the right frontal lobe potentially affecting retrieval of recently learned information.  Delete that laboratory Tests: Patient has been diligently working with endocrinologist around his diabetes but continues with elevations in his A1c.  Patient also identified with hyperlipidemia and hypercholesterolemia and lab work along with elevated blood glucose levels but better managed now.  Anemia also noted in blood work.  The  vitamins within normal limits but it does appear he may be taking B12 with recent elevations of B12 1 year ago versus 6 years ago.  Sleep: Patient notes that his sleep is very inconsistent.  Patient notes that often times he will wake up during the night and have difficulty going back to sleep.  The patient was diagnosed with obstructive sleep apnea and is diligent with his CPAP use.  He is not taking naps very often.  Diet Pattern: Patient is working on having a good diet and is working closely with his endocrinologist around his diabetes and working with nephrology around his chronic kidney disease.  Behavioral Observation/Mental Status:   Alex Rocha  presents as a 67 y.o.-year-old Right handed African American Male who appeared his stated age. his dress was Appropriate and he was Well Groomed and his manners were Appropriate to the situation.  his participation was indicative of Appropriate behaviors.  There were not physical disabilities noted.  he displayed an appropriate level of cooperation and motivation.    Interactions:    Active Appropriate  Attention:   within normal limits and attention span and concentration were age appropriate  Memory:   within normal limits; recent and remote memory intact  Visuo-spatial:   not examined  Speech (Volume):  normal  Speech:   normal; normal  Thought Process:  Coherent and Relevant  Coherent and Logical  Though Content:  WNL; not suicidal and not homicidal  Orientation:   person, place, time/date, and situation  Judgment:   Good  Planning:   Good  Affect:    Appropriate  Mood:    Euthymic  Insight:   Good  Intelligence:   normal  Marital Status/Living:  The patient was born in [place] along with 3 siblings.  The patient current lives with his wife of 35 years with 1 previous marriage that lasted for 11 months.  The patient has no children.  Educational and Occupational History:     Highest Level of Education:    The patient graduated from high school and then attended [employer] maintaining a good GPA (3.1) and played college football at [employer].  Educational and Career Achievements: Patient studied criminal justice in college and made the [employer] and left school after graduating.  The patient reports that he did take 1 semester of graduate school before going into his occupation.  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  Hobbies and Interests: Golf   Psychiatric History:  No prior psychiatric history  Delete that History of Substance Use or Abuse:  No concerns of substance abuse are reported.  Patient rarely has any alcohol and no other substance use.  Family Med/Psych History:  Family History  Problem Relation Age of Onset   Colon cancer Mother    Heart disease Mother    Hypertension Mother        Mother's side of the family  Diabetes Mother        Mother's side of the family   Hyperlipidemia Sister    Hypertension Sister    Diabetes Sister    Heart disease Sister    Osteoarthritis Sister    Diabetes Sister    Colon cancer Brother    Cancer Paternal Aunt        unknown type   Rectal cancer Other    Stomach cancer Other    Heart disease Other    Diabetes Other        father's family   Breast cancer Neg Hx    Pancreatic cancer Neg Hx     Impression/DX:   Alex Rocha is a 67 year old male referred for neuropsychological evaluation by his primary care physician Seabron Spates, DO due to ongoing issues with memory particularly short-term semantic memory but also episodic memory and times of geographic disorientation.  Patient has a past medical history including prostate cancer, type 2 diabetes with insulin use and prior uncontrolled state and hyperglycemia, obstructive sleep apnea, stage III chronic kidney disease, obstructive sleep apnea with compliant CPAP use, GERD, hypertension and  hyperlipidemia.  The patient describes worsening of new learning and particular short-term memory that he has noticed to be apparently worsening over the past year or so.  The patient describes events in the prior year of driving down roads that he was very familiar with and without forgetting where he was going or being confused in any way that nothing would look "familiar" and he would struggle identifying where he is on his path destination.  The patient reports that it was more frequent last year but has continued but less frequent more recently.  Patient notes the biggest issue now is that his short-term memory "is shot."  The patient reports that he will forget paperwork, appointments, people's names or other factual information almost immediately.  Patient describes greater semantic memory difficulties and episodic memory difficulties.  Patient denies other cognitive changes and denies other visual spatial or visual constructional difficulties, changes in executive functioning, changes in attention related to encoding factors etc.  Disposition/Plan:  We have set the patient for formal neuropsychological testing and he will complete a foundational battery of the Wechsler Adult Intelligence Scale's and Wechsler Memory Scale's along with the control or word association test and once these are completed a determination will be made as to any other potential testing that would be needed.  Formal report will be produced with diagnostic considerations and recommendations and made available/forwarded along to his referring physician.  I will also sit down with the patient go over the results of the neuropsychological evaluation.  Diagnosis:    Memory loss  Primary hypertension  Obstructive sleep apnea syndrome  Type 2 diabetes mellitus without complication, without long-term current use of insulin (HCC)  Malignant neoplasm of prostate (HCC)        Note: This document was prepared using Dragon voice  recognition software and may include unintentional dictation errors.   Electronically Signed   _______________________ Arley Phenix, Psy.D. Clinical Neuropsychologist

## 2023-05-01 ENCOUNTER — Encounter: Payer: Self-pay | Admitting: Internal Medicine

## 2023-05-01 ENCOUNTER — Ambulatory Visit: Payer: Medicare Other | Admitting: Internal Medicine

## 2023-05-01 VITALS — BP 120/70 | HR 83 | Ht 74.0 in | Wt 292.8 lb

## 2023-05-01 DIAGNOSIS — E890 Postprocedural hypothyroidism: Secondary | ICD-10-CM

## 2023-05-01 DIAGNOSIS — E041 Nontoxic single thyroid nodule: Secondary | ICD-10-CM

## 2023-05-01 DIAGNOSIS — Z794 Long term (current) use of insulin: Secondary | ICD-10-CM

## 2023-05-01 DIAGNOSIS — E119 Type 2 diabetes mellitus without complications: Secondary | ICD-10-CM

## 2023-05-01 LAB — POCT GLYCOSYLATED HEMOGLOBIN (HGB A1C): Hemoglobin A1C: 6.1 % — AB (ref 4.0–5.6)

## 2023-05-01 NOTE — Progress Notes (Signed)
 Patient ID: Alex Rocha, male   DOB: Mar 24, 1956, 68 y.o.   MRN: [id number]  HPI: Alex Rocha is a 68 y.o.-year-old male, returning for follow-up for DM2, dx in 2000, insulin -dependent since 2014, controlled, with complications (cerebellar CVA, CKD stage III, peripheral neuropathy) and also thyroid  nodule and post ablative hypothyroidism. previously saw Dr. Kassie, last visit with me 4 months ago.  Interim history: No increased urination, blurry vision, nausea, chest pain.  DM2: Reviewed HbA1c: Lab Results  Component Value Date   HGBA1C 6.3 (A) 12/29/2022   HGBA1C 6.9 (A) 06/22/2022   HGBA1C 6.8 (H) 03/31/2022   HGBA1C 6.4 10/25/2021   HGBA1C 6.0 (A) 08/04/2021   HGBA1C 5.9 (A) 03/31/2021   HGBA1C 5.7 (A) 11/29/2020   HGBA1C 7.1 (H) 09/14/2020   HGBA1C 6.9 (A) 07/09/2020   HGBA1C 6.3 03/09/2020   Pt is on a regimen of: - NPH 80 units in a.m. and 60 >> 0-40 >> 20-30 >> 30 units at night  - Jardiance  10 mg before breakfast - started 06/2022 Also on cinnamon 500 mg daily. He tried Metformin  in the past - tolerated well, from what he can remember  Pt checks his sugars >4x a day a day and they are:  Prev.:  Previously:   Lowest sugar was 51 - seldom >> 67; he has hypoglycemia awareness at 70.  Highest sugar was 250 >> 229.  Glucometer: none  Pt's meals are: - Breakfast:  fruit - Lunch: salad , sandwich - Dinner: meat (chicken or grilled pork or beef) + starch (low carb pasta) + veggies; veggie burgers; may eat out - Snacks: no sweet drinks; crackers or cookies  - + stage 3 CKD - sees nephrology, last BUN/creatinine:  Lab Results  Component Value Date   BUN 44 (H) 11/24/2022   BUN 42 (H) 03/31/2022   CREATININE 1.56 (H) 11/24/2022   CREATININE 1.65 (H) 03/31/2022   Lab Results  Component Value Date   MICRALBCREAT 0.9 03/31/2022   MICRALBCREAT 9 10/19/2021   MICRALBCREAT 0.7 09/29/2021   MICRALBCREAT 0.9 09/14/2020   MICRALBCREAT 0.6 06/10/2020    MICRALBCREAT 0.6 10/02/2019   MICRALBCREAT 4 09/05/2019   MICRALBCREAT 0.6 05/22/2017   MICRALBCREAT 2 06/18/2015   MICRALBCREAT 0.5 06/12/2014  On Cozaar  100 mg daily.  -+ HL; last set of lipids: Lab Results  Component Value Date   CHOL 178 11/24/2022   HDL 35.50 (L) 11/24/2022   LDLCALC 111 (H) 11/24/2022   LDLDIRECT 107.0 06/10/2020   TRIG 157.0 (H) 11/24/2022   CHOLHDL 5 11/24/2022  On fenofibrate  160 mg daily, omega-3 fatty acids.  - last eye exam was in 12/25/2022. No DR reportedly. He had cataract Sx - summer 2023.  - + numbness and tingling in his feet. Last foot exam 03/2022.  Thyroid  nodule:  Thyroid  U/S (01/01/2013): Right thyroid  lobe: 6.6 x 2.9 x 2.9 cm several rounded nodules are present  within the right lobe. The largest is a 2.3 x 1.6 cm solid nodule in  the mid right thyroid  gland.   Left thyroid  lobe: 5.7 x 2.4 x 2.4 cm.  No nodules visualized.   Isthmus Thickness: 0.4 cm   No nodules visualized.   Lymphadenopathy: None visualized.   IMPRESSION:  1. Multiple nodules within the right lobe of thyroid  gland. The  largest nodule measures 2.3 cm and is solid. Findings meet consensus  criteria for biopsy.   Thyroid  uptake and scan (01/21/2013): Normal 24 hr radio iodine uptake of 17.3%.  Dominant  right lobe nodule measuring 2.3 cm diameter on ultrasound is cold on scintigraphy.  Malignancy not excluded.   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  Solitary 15 x 17 x 16 mm hypoechoic solid nodule in the lower pole of the right gland. This has decreased in size compared to 23 x 17 x 16 mm previously. The background thyroid  parenchyma is mildly heterogeneous.   Left thyroid  lobe: 3.8 x 1.3 x 1.5 cm. Small and relatively heterogeneous thyroid  gland. No discrete nodule.   Isthmus Thickness: 0.5 cm.  No nodules visualized.   Lymphadenopathy: None visualized.    IMPRESSION: 1. Interval involution of the previously noted right lower pole thyroid  nodule which presently measures a maximum of 17 mm compared to 23 mm on 01/01/2013. Decrease in size over time is highly consistent with a benign process. 2. No additional thyroid  nodules identified. 3. The background thyroid  gland is mildly heterogeneous.  Pt denies: - feeling nodules in neck - hoarseness - dysphagia - choking  Post ablative hypothyroidism  Pt is on levothyroxine  112 mcg daily, taken: - in am - fasting - at least 30 min from b'fast - no calcium  - no iron - no multivitamins - no PPIs - not on Biotin  Reviewed his TSH levels: Lab Results  Component Value Date   TSH 2.30 11/24/2022   TSH 1.89 03/31/2022   TSH 1.59 10/25/2021   TSH 0.65 09/29/2021   TSH 1.82 12/10/2020   TSH 2.49 06/10/2020   TSH 2.42 03/09/2020   TSH 1.47 10/02/2019   TSH 0.76 09/11/2019   TSH 5.02 (H) 08/08/2019   TSH 3.62 05/29/2019   TSH 3.25 02/07/2019   TSH 1.58 12/04/2017   TSH 2.08 08/14/2016   TSH 2.50 06/18/2015   TSH 4.060 04/24/2014   TSH 2.70 03/09/2014   TSH 2.69 12/08/2013   TSH 5.04 (H) 08/25/2013   TSH 0.05 (L) 06/26/2013   No FH of thyroid  cancer. No h/o radiation tx to head or neck. No herbal supplements. No Biotin use. No recent steroids use.   He also has a history of OSA, HTN, prostate cancer, kidney stones, low testosterone, cataract.  ROS: + see HPI +  Past Medical History:  Diagnosis Date   Anemia    Cataract    Colon polyps    Diabetes mellitus    Environmental allergies    Hyperlipidemia    Hypertension    Hyperthyroidism    Kidney stones    Low testosterone    Prostate cancer [facility])    Sleep apnea    Past Surgical History:  Procedure Laterality Date   CATARACT EXTRACTION Left 09/2021   CATARACT EXTRACTION Right 11/2021   PROSTATE BIOPSY     VASECTOMY     Social History   Socioeconomic History   Marital status: Married    Spouse name: Not on  file   Number of children: 2   Years of education: Not on file   Highest education level: Not on file  Occupational History   Occupation: location manager  Tobacco Use   Smoking status: Never   Smokeless tobacco: Never  Vaping Use   Vaping status: Never Used  Substance and Sexual Activity   Alcohol use: Yes    Comment: rare - on social occasions   Drug use: No   Sexual activity: Yes  Other Topics Concern   Not on file  Social History Narrative  Exercise----not as much--- 1-2 x a week    Social Drivers of Corporate Investment Banker Strain: Not on file  Food Insecurity: Not on file  Transportation Needs: Not on file  Physical Activity: Not on file  Stress: Not on file  Social Connections: Not on file  Intimate Partner Violence: Not on file   Current Outpatient Medications on File Prior to Visit  Medication Sig Dispense Refill   acetic acid -hydrocortisone  (VOSOL -HC) OTIC solution Place 5 drops into both ears 2 (two) times daily. 10 mL 0   Cinnamon 500 MG capsule Take 500 mg by mouth daily.     co-enzyme Q-10 30 MG capsule Take 30 mg by mouth 3 (three) times daily.     Continuous Glucose Sensor (DEXCOM G7 SENSOR) MISC 3 each by Does not apply route every 30 (thirty) days. Apply 1 sensor every 10 days 9 each 4   diclofenac  sodium (VOLTAREN ) 1 % GEL APPLY 4 G TOPICALLY 4 (FOUR) TIMES DAILY. 100 g 1   empagliflozin  (JARDIANCE ) 10 MG TABS tablet Take 1 tablet (10 mg total) by mouth daily before breakfast. 90 tablet 3   fenofibrate  160 MG tablet TAKE 1 TABLET BY MOUTH EVERY DAY 30 tablet 2   fluticasone  (FLONASE ) 50 MCG/ACT nasal spray Place 2 sprays into both nostrils daily. 16 g 6   insulin  NPH Human (HUMULIN N) 100 UNIT/ML injection INJECT 80 UNITS EACH MORNING, AND 60 UNITS EACH EVENING. 120 mL 3   Insulin  Syringe-Needle U-100 (BD INSULIN  SYRINGE U/F) 31G X 5/16 1 ML MISC 1 EACH BY OTHER ROUTE 2 (TWO) TIMES DAILY. AS DIRECTED 200 each 3   levothyroxine  (SYNTHROID ) 112  MCG tablet Take 1 tablet (112 mcg total) by mouth daily. 90 tablet 1   loratadine  (CLARITIN ) 10 MG tablet TAKE 1 TABLET BY MOUTH EVERY DAY 90 tablet 1   losartan  (COZAAR ) 100 MG tablet Take 1 tablet (100 mg total) by mouth daily. 90 tablet 1   Omega 3-6-9 Fatty Acids (OMEGA 3-6-9 COMPLEX PO) Take by mouth.     tamsulosin (FLOMAX) 0.4 MG CAPS capsule Take 0.4 mg by mouth daily.     Turmeric 1053 MG TABS Take by mouth.     No current facility-administered medications on file prior to visit.   Allergies  Allergen Reactions   [name] [Yohimbine] Palpitations   Family History  Problem Relation Age of Onset   Colon cancer Mother    Heart disease Mother    Hypertension Mother        Mother's side of the family   Diabetes Mother        Mother's side of the family   Hyperlipidemia Sister    Hypertension Sister    Diabetes Sister    Heart disease Sister    Osteoarthritis Sister    Diabetes Sister    Colon cancer Brother    Cancer Paternal Aunt        unknown type   Rectal cancer Other    Stomach cancer Other    Heart disease Other    Diabetes Other        father's family   Breast cancer Neg Hx    Pancreatic cancer Neg Hx    PE: BP 120/70   Pulse 83   Ht 6' 2 (1.88 m)   Wt 292 lb 12.8 oz (132.8 kg)   SpO2 96%   BMI 37.59 kg/m  Wt Readings from Last 3 Encounters:  05/01/23 292 lb 12.8 oz (132.8 kg)  12/29/22 298 lb 12.8 oz (135.5 kg)  11/24/22 297 lb 9.6 oz (135 kg)   Constitutional: overweight, in NAD Eyes:  EOMI, + B exophthalmos ENT: no neck masses, no cervical lymphadenopathy Cardiovascular: RRR, No MRG Respiratory: CTA B Musculoskeletal: no deformities Skin:no rashes Neurological: no tremor with outstretched hands Diabetic Foot Exam - Simple   Simple Foot Form Diabetic Foot exam was performed with the following findings: Yes 05/01/2023  3:15 PM  Visual Inspection No deformities, no ulcerations, no other skin breakdown bilaterally: Yes Sensation Testing Intact  to touch and monofilament testing bilaterally: Yes Pulse Check Posterior Tibialis and Dorsalis pulse intact bilaterally: Yes Comments Absent halluceal toenails (removed as they were ingrown).    ASSESSMENT: 1. DM2, insulin -dependent, controlled, with complications - CVA -cerebellum -seen on MRI 04/26/2022 - CKD stage 3 - PN  2. Right thyroid  nodule  3. Post ablative hypothyroidism  PLAN:  1. Patient with longstanding, uncontrolled, diabetes, on intermediate acting insulin  and oral regimen with SGLT2 inhibitor, with improving control at last visit.  At that time, HbA1c was 6.3%.  Sugars were mostly fluctuating within the target range with only occasional mild hyperglycemic values after meals.  He mentions that they started to improve after starting exercise 1 months prior to the visit.  However, he sometimes had to skip his evening insulin  in the days that he was exercising to avoid low blood sugars overnight.  He did reduce his evening dose and I advised him to reduce it further.   CGM interpretation: -At today's visit, we reviewed his CGM downloads: It appears that 96% of values are in target range (goal >70%), while 4% are higher than 180 (goal <25%), and 0% are lower than 70 (goal <4%).  The calculated average blood sugar is 130.  The projected HbA1c for the next 3  months (GMI) is 6.4%. -Reviewing the CGM trends, sugars appear to be well-controlled, fluctuating within the target range.  He has no lows.  He has an occasional hyperglycemic peaks but these are quite mild and not frequent.  For now, I suggested to continue the same regimen but at next visit, if the sugars remain controlled, will need to decrease the insulin  doses. - I suggested to:  Patient Instructions  Please change: - NPH 80 units in a.m. and 30 units at night - Jardiance  10 mg before b'fast  Please continue Levothyroxine  112 mcg daily.  Take the thyroid  hormone every day, with water, at least 30 minutes before  breakfast, separated by at least 4 hours from: - acid reflux medications - calcium  - iron - multivitamins  Please return for another visit in 4-6 months.   - we checked his HbA1c: 6.1% (slightly lower) - advised to check sugars at different times of the day - 4x a day, rotating check times - advised for yearly eye exams >> he is UTD - return to clinic in 4-6 months  2. Right thyroid  nodule -With history of benign biopsy -see HPI -He had RAI treatment and the size of the nodule and the rest of the thyroid  decreased afterwards. -Follow-up ultrasound from 2016 showed a much smaller R nodule, with no need for follow-up -Has no neck compression symptoms or masses felt on palpation of her neck today  3. Post ablative hypothyroidism - latest thyroid  labs reviewed with pt. >> normal: Lab Results  Component Value Date   TSH 2.30 11/24/2022  - he continues on LT4 112 mcg daily - pt feels good on this dose. - we  discussed about taking the thyroid  hormone every day, with water, >30 minutes before breakfast, separated by >4 hours from acid reflux medications, calcium , iron, multivitamins. Pt. is taking it correctly.  Lela Fendt, MD PhD [facility] Endocrinology

## 2023-05-01 NOTE — Patient Instructions (Addendum)
 Please change: - NPH 80 units in a.m. and 30 units at night - Jardiance  10 mg before b'fast  Please continue Levothyroxine  112 mcg daily.  Take the thyroid  hormone every day, with water, at least 30 minutes before breakfast, separated by at least 4 hours from: - acid reflux medications - calcium  - iron - multivitamins  Please return for another visit in 4-6 months.

## 2023-05-01 NOTE — Addendum Note (Signed)
 Addended by: Pollie Meyer on: 05/01/2023 03:50 PM   Modules accepted: Orders

## 2023-05-03 ENCOUNTER — Ambulatory Visit: Payer: Medicare Other

## 2023-05-10 ENCOUNTER — Encounter: Payer: Medicare Other | Attending: Psychology

## 2023-05-10 DIAGNOSIS — G4733 Obstructive sleep apnea (adult) (pediatric): Secondary | ICD-10-CM | POA: Diagnosis not present

## 2023-05-10 DIAGNOSIS — I10 Essential (primary) hypertension: Secondary | ICD-10-CM | POA: Diagnosis not present

## 2023-05-10 DIAGNOSIS — R413 Other amnesia: Secondary | ICD-10-CM | POA: Insufficient documentation

## 2023-05-10 NOTE — Progress Notes (Signed)
Behavioral Observations:  The patient appeared well-groomed and appropriately dressed. His manners were polite and appropriate to the situation. The patient's attitude towards testing was positive and his effort was good.  Neuropsychology Note  Alex Rocha completed 135 minutes of neuropsychological testing with technician, Staci Acosta, BA, under the supervision of Arley Phenix, PsyD., Clinical Neuropsychologist. The patient did not appear overtly distressed by the testing session, per behavioral observation or via self-report to the technician. Rest breaks were offered.   Clinical Decision Making: In considering the patient's current level of functioning, level of presumed impairment, nature of symptoms, emotional and behavioral responses during clinical interview, level of literacy, and observed level of motivation/effort, a battery of tests was selected by Dr. Kieth Brightly during initial consultation on 04/12/2023. This was communicated to the technician. Communication between the neuropsychologist and technician was ongoing throughout the testing session and changes were made as deemed necessary based on patient performance on testing, technician observations and additional pertinent factors such as those listed above.  Tests Administered: Controlled Oral Word Association Test (COWAT; FAS & Animals)  Wechsler Adult Intelligence Scale, 4th Edition (WAIS-IV) Wechsler Memory Scale, 4th Edition (WMS-IV); Adult Battery   Results:  COWAT: FAS total= 40 Z= 0.11 Animals total= 28 Z= 2.21   WAIS-IV: Composite Score Summary  Scale Sum of Scaled Scores Composite Score Percentile Rank 95% Conf. Interval Qualitative Description  Verbal Comprehension 36 VCI 110 75 104-115 High Average  Perceptual Reasoning 29 PRI 98 45 92-104 Average  Working Memory 25 WMI 114 82 106-120 High Average  Processing Speed 18 PSI 94 34 86-103 Average  Full Scale 108 FSIQ 105 63 101-109 Average   General Ability 65 GAI 104 61 99-109 Average   Verbal Comprehension Subtests Summary  Subtest Raw Score Scaled Score Percentile Rank Reference Group Scaled Score SEM  Similarities 22 9 37 9 1.04  Vocabulary 51 14 91 15 0.67  Information 20 13 84 14 0.73  The scaled scores in the Reference Group Scaled Score column are based on the performance of examinees aged 20:0-34:11 (i.e., the reference group). See Chapter 6 of the WAIS-IV Technical and Interpretive Manual for more information.  Perceptual Reasoning Subtests Summary  Subtest Raw Score Scaled Score Percentile Rank Reference Group Scaled Score SEM  Block Design 24 8 25 6  1.08  Matrix Reasoning 18 13 84 9 0.90  Visual Puzzles 9 8 25 6  0.85   Working Librarian, academic Raw Score Scaled Score Percentile Rank Reference Group Scaled Score SEM  Digit Span 27 11 63 9 0.79  Arithmetic 18 14 91 13 0.99   Processing Speed Subtests Summary  Subtest Raw Score Scaled Score Percentile Rank Reference Group Scaled Score SEM  Symbol Search 23 9 37 6 1.31  Coding 50 9 37 6 0.99    WMS-IV: Index Score Summary  Index Sum of Scaled Scores Index Score Percentile Rank 95% Confidence Interval Qualitative Descriptor  Auditory Memory (AMI) 48 112 79 105-118 High Average  Visual Memory (VMI) 20 69 2 65-76 Extremely Low  Visual Working Memory (VWMI) 15 85 16 79-93 Low Average  Immediate Memory (IMI) 39 98 45 92-104 Average  Delayed Memory (DMI) 29 81 10 75-89 Low Average    Primary Subtest Scaled Score Summary  Subtest Domain Raw Score Scaled Score Percentile Rank  Logical Memory I AM 32 13 84  Logical Memory II AM 28 13 84  Verbal Paired Associates I AM 30 11 63  Verbal Paired Associates  II AM 10 11 63  Designs I VM 34 1 0.1  Designs II VM 30 4 2   Visual Reproduction I VM 40 14 91  Visual Reproduction II VM 0 1 0.1  Spatial Addition VWM 8 9 37  Symbol Span VWM 10 6 9      Auditory Memory Process Score Summary  Process  Score Raw Score Scaled Score Percentile Rank Cumulative Percentage (Base Rate)  LM II Recognition 26 - - >75%  VPA II Recognition 40 - - >75%   Visual Memory Process Score Summary  Process Score Raw Score Scaled Score Percentile Rank Cumulative Percentage (Base Rate)  DE I Content 15 1 0.1 -  DE I Spatial 7 3 1  -  DE II Content 18 4 2  -  DE II Spatial 6 5 5  -  DE II Recognition 13 - - 26-50%  VR II Recognition 3 - - 10-16%     ABILITY-MEMORY ANALYSIS  Ability Score:  VCI: 110 Date of Testing:  WAIS-IV; WMS-IV 2023/05/10  Predicted Difference Method   Index Predicted WMS-IV Index Score Actual WMS-IV Index Score Difference Critical Value  Significant Difference Y/N Base Rate  Auditory Memory 105 112 -7 10.15 N   Visual Memory 105 69 36 9.30 Y <1%  Visual Working Memory 106 85 21 11.68 Y 5%  Immediate Memory 106 98 8 11.15 N   Delayed Memory 105 81 24 11.49 Y 3-4%  Statistical significance (critical value) at the .01 level.    Feedback to Patient: Alex Rocha will return on 10/11/2023 for an interactive feedback session with Dr. Kieth Brightly at which time his test performances, clinical impressions and treatment recommendations will be reviewed in detail. The patient understands he can contact our office should he require our assistance before this time.  135 minutes spent face-to-face with patient administering standardized tests, 30 minutes spent scoring Radiographer, therapeutic). [CPT [id number], [id number]]  Full report to follow.

## 2023-05-14 ENCOUNTER — Other Ambulatory Visit: Payer: Self-pay | Admitting: Internal Medicine

## 2023-05-15 DIAGNOSIS — I129 Hypertensive chronic kidney disease with stage 1 through stage 4 chronic kidney disease, or unspecified chronic kidney disease: Secondary | ICD-10-CM | POA: Diagnosis not present

## 2023-05-15 DIAGNOSIS — N2581 Secondary hyperparathyroidism of renal origin: Secondary | ICD-10-CM | POA: Diagnosis not present

## 2023-05-15 DIAGNOSIS — E785 Hyperlipidemia, unspecified: Secondary | ICD-10-CM | POA: Diagnosis not present

## 2023-05-15 DIAGNOSIS — E1122 Type 2 diabetes mellitus with diabetic chronic kidney disease: Secondary | ICD-10-CM | POA: Diagnosis not present

## 2023-05-15 DIAGNOSIS — N1832 Chronic kidney disease, stage 3b: Secondary | ICD-10-CM | POA: Diagnosis not present

## 2023-05-18 ENCOUNTER — Encounter: Payer: Self-pay | Admitting: Internal Medicine

## 2023-06-13 ENCOUNTER — Encounter: Payer: Medicare Other | Attending: Psychology | Admitting: Psychology

## 2023-06-13 ENCOUNTER — Other Ambulatory Visit: Payer: Self-pay | Admitting: Family Medicine

## 2023-06-13 DIAGNOSIS — I6990 Unspecified sequelae of unspecified cerebrovascular disease: Secondary | ICD-10-CM

## 2023-06-13 DIAGNOSIS — Z794 Long term (current) use of insulin: Secondary | ICD-10-CM | POA: Diagnosis not present

## 2023-06-13 DIAGNOSIS — I10 Essential (primary) hypertension: Secondary | ICD-10-CM | POA: Insufficient documentation

## 2023-06-13 DIAGNOSIS — G4733 Obstructive sleep apnea (adult) (pediatric): Secondary | ICD-10-CM | POA: Diagnosis not present

## 2023-06-13 DIAGNOSIS — E119 Type 2 diabetes mellitus without complications: Secondary | ICD-10-CM

## 2023-06-13 DIAGNOSIS — R413 Other amnesia: Secondary | ICD-10-CM | POA: Insufficient documentation

## 2023-07-05 ENCOUNTER — Ambulatory Visit (INDEPENDENT_AMBULATORY_CARE_PROVIDER_SITE_OTHER): Admitting: Family Medicine

## 2023-07-05 ENCOUNTER — Other Ambulatory Visit

## 2023-07-05 ENCOUNTER — Encounter: Payer: Self-pay | Admitting: Family Medicine

## 2023-07-05 VITALS — BP 122/78 | HR 70 | Temp 98.9°F | Resp 18 | Ht 74.0 in | Wt 293.8 lb

## 2023-07-05 DIAGNOSIS — D649 Anemia, unspecified: Secondary | ICD-10-CM | POA: Diagnosis not present

## 2023-07-05 DIAGNOSIS — E890 Postprocedural hypothyroidism: Secondary | ICD-10-CM

## 2023-07-05 DIAGNOSIS — E1169 Type 2 diabetes mellitus with other specified complication: Secondary | ICD-10-CM

## 2023-07-05 DIAGNOSIS — Z794 Long term (current) use of insulin: Secondary | ICD-10-CM

## 2023-07-05 DIAGNOSIS — N1832 Chronic kidney disease, stage 3b: Secondary | ICD-10-CM | POA: Diagnosis not present

## 2023-07-05 DIAGNOSIS — I10 Essential (primary) hypertension: Secondary | ICD-10-CM | POA: Diagnosis not present

## 2023-07-05 DIAGNOSIS — E785 Hyperlipidemia, unspecified: Secondary | ICD-10-CM

## 2023-07-05 DIAGNOSIS — M1711 Unilateral primary osteoarthritis, right knee: Secondary | ICD-10-CM

## 2023-07-05 DIAGNOSIS — E119 Type 2 diabetes mellitus without complications: Secondary | ICD-10-CM

## 2023-07-05 LAB — CBC WITH DIFFERENTIAL/PLATELET
Basophils Absolute: 0 10*3/uL (ref 0.0–0.1)
Basophils Relative: 1 % (ref 0.0–3.0)
Eosinophils Absolute: 0.1 10*3/uL (ref 0.0–0.7)
Eosinophils Relative: 2.8 % (ref 0.0–5.0)
HCT: 37.9 % — ABNORMAL LOW (ref 39.0–52.0)
Hemoglobin: 12.7 g/dL — ABNORMAL LOW (ref 13.0–17.0)
Lymphocytes Relative: 42.3 % (ref 12.0–46.0)
Lymphs Abs: 1.8 10*3/uL (ref 0.7–4.0)
MCHC: 33.6 g/dL (ref 30.0–36.0)
MCV: 94.7 fl (ref 78.0–100.0)
Monocytes Absolute: 0.4 10*3/uL (ref 0.1–1.0)
Monocytes Relative: 9.1 % (ref 3.0–12.0)
Neutro Abs: 1.9 10*3/uL (ref 1.4–7.7)
Neutrophils Relative %: 44.8 % (ref 43.0–77.0)
Platelets: 259 10*3/uL (ref 150.0–400.0)
RBC: 4 Mil/uL — ABNORMAL LOW (ref 4.22–5.81)
RDW: 13.7 % (ref 11.5–15.5)
WBC: 4.2 10*3/uL (ref 4.0–10.5)

## 2023-07-05 LAB — COMPREHENSIVE METABOLIC PANEL
ALT: 29 U/L (ref 0–53)
AST: 30 U/L (ref 0–37)
Albumin: 5 g/dL (ref 3.5–5.2)
Alkaline Phosphatase: 36 U/L — ABNORMAL LOW (ref 39–117)
BUN: 27 mg/dL — ABNORMAL HIGH (ref 6–23)
CO2: 28 meq/L (ref 19–32)
Calcium: 10.1 mg/dL (ref 8.4–10.5)
Chloride: 103 meq/L (ref 96–112)
Creatinine, Ser: 1.6 mg/dL — ABNORMAL HIGH (ref 0.40–1.50)
GFR: 44.94 mL/min — ABNORMAL LOW (ref >60.00)
Glucose, Bld: 70 mg/dL (ref 70–99)
Potassium: 4.9 meq/L (ref 3.5–5.1)
Sodium: 139 meq/L (ref 135–145)
Total Bilirubin: 0.5 mg/dL (ref 0.2–1.2)
Total Protein: 7.8 g/dL (ref 6.0–8.3)

## 2023-07-05 LAB — LIPID PANEL
Cholesterol: 172 mg/dL (ref 0–200)
HDL: 46.8 mg/dL (ref >39.00)
LDL Cholesterol: 99 mg/dL (ref 0–99)
NonHDL: 125.03
Total CHOL/HDL Ratio: 4
Triglycerides: 132 mg/dL (ref 0.0–149.0)
VLDL: 26.4 mg/dL (ref 0.0–40.0)

## 2023-07-05 LAB — TSH: TSH: 2.02 u[IU]/mL (ref 0.35–5.50)

## 2023-07-05 LAB — IBC + FERRITIN
Ferritin: 244.5 ng/mL (ref 22.0–322.0)
Iron: 80 ug/dL (ref 42–165)
Saturation Ratios: 22 % (ref 20.0–50.0)
TIBC: 364 ug/dL (ref 250.0–450.0)
Transferrin: 260 mg/dL (ref 212.0–360.0)

## 2023-07-05 LAB — VITAMIN B12: Vitamin B-12: 499 pg/mL (ref 211–911)

## 2023-07-05 MED ORDER — INSULIN SYRINGE-NEEDLE U-100 31G X 5/16" 1 ML MISC: 3 refills | Status: AC

## 2023-07-05 NOTE — Patient Instructions (Signed)
 Chronic Knee Pain, Adult Knee pain that lasts longer than 3 months is called chronic knee pain. You may have pain in one or both knees. Symptoms of chronic knee pain may also include swelling and stiffness. Many conditions can cause chronic knee pain. The most common cause is wear and tear of your knee joint as you get older. Other possible causes include: A disease that causes inflammation of the knee, such as rheumatoid arthritis. This usually affects both knees. A condition called inflammatory arthritis, such as gout. An injury to the knee that causes arthritis. An injury to the knee that damages the ligaments. Ligaments are tissues that connect bones to each other. Runner's knee or pain behind the kneecap. Treatment for chronic knee pain depends on the cause. The main treatments for chronic knee pain are: Doing exercises to help your knee move better and get stronger, called physical therapy. Losing weight if you are overweight. This condition may also be treated with medicines, injections, a knee sleeve or brace, and by using crutches. You health care provider may also recommend rest, ice, pressure (compression), and elevation, also called RICE therapy. Follow these instructions at home: If you have a knee sleeve or brace that can be taken off:  Wear the knee sleeve or brace as told by your provider. Take it off only if your provider says that you can. Check the skin around it every day. Tell your provider if you see problems. Loosen the knee sleeve or brace if your toes tingle, are numb, or turn cold and blue. Keep the knee sleeve or brace clean and dry. Bathing If the knee sleeve or brace is not waterproof: Do not let it get wet. Cover it when you take a bath or shower. Use a cover that does not let any water in. Managing pain, stiffness, and swelling     If told, put heat on the area. Do this as often as told. Use the heat source that your provider recommends, such as a moist  heat pack or a heating pad. If you have a knee sleeve or brace that you can take off, remove it as told. Place a towel between your skin and the heat source. Leave the heat on for 20-30 minutes. If told, put ice on the area. If you have a knee sleeve or brace that you can take off, remove it as told. Put ice in a plastic bag. Place a towel between your skin and the bag. Leave the ice on for 20 minutes, 2-3 times a day. If your skin turns bright red, remove the ice or heat right away to prevent skin damage. The risk of damage is higher if you cannot feel pain, heat, or cold. Move your toes often to reduce stiffness and swelling. Raise the injured area above the level of your heart while you are sitting or lying down. Use a pillow to support your foot as needed. Activity Avoid activities where both feet leave the ground at the same time. Avoid running, jumping rope, and doing jumping jacks. Follow the exercise plan that your provider made for you. Your provider may suggest that you: Avoid activities that make knee pain worse. This may mean that you need to change your exercise routines, sports, or job duties. Wear shoes with cushioned soles. Avoid sports that require running and sudden changes in direction. Do physical therapy. Physical therapy helps your knee move better and get stronger. Exercise as told. Do exercises that increase balance and strength, such as  tai chi and yoga. Do not stand or walk on your injured knee until you're told it's okay. Use crutches as told. Return to normal activities when you're told. Ask what things are safe for you to do. General instructions Take your medicines only as told by your provider. If you are overweight, work with your provider and an expert in healthy eating called a dietitian to set goals to lose weight. Losing even a little weight can reduce knee pain. Being overweight can make your knee hurt more. Do not smoke, vape, or use products with  nicotine or tobacco in them. If you need help quitting, talk with your provider. Keep all follow-up visits. Your provider will monitor your pain and try other treatments if needed. Contact a health care provider if: You have knee pain that is not getting better or gets worse. You are not able to do your exercises due to knee pain. Get help right away if: Your knee swells and the swelling becomes worse. You cannot move your knee. You have severe knee pain. This information is not intended to replace advice given to you by your health care provider. Make sure you discuss any questions you have with your health care provider. Document Revised: 01/04/2023 Document Reviewed: 05/29/2022 Elsevier Patient Education  2024 [employer].

## 2023-07-05 NOTE — Progress Notes (Signed)
 Established Patient Office Visit  Subjective   Patient ID: Alex Rocha, male    DOB: 1955/10/03  Age: 68 y.o. MRN: [id number]  Chief Complaint  Patient presents with   Arthritis    Right knee and possibly in his hands. Pt would like to discuss gel shots     HPI Discussed the use of AI scribe software for clinical note transcription with the patient, who gave verbal consent to proceed.  History of Present Illness   Alex Rocha is a 68 year old male with right knee arthritis who presents for a referral to sports medicine for consideration of gel injections.  He has a history of right knee arthritis, which has worsened recently, particularly after he started exercising. The pain has become severe enough to limit his physical activity, and he has resorted to taking ibuprofen despite his kidney disease, as he 'couldn't even get up the stairs'. He uses Voltaren gel nightly, which provides some relief, and wears a splint that he finds helpful.  He feels 'wasted all the time' due to anemia and vitamin D deficiency. He is not currently taking vitamin D or iron supplements because of his kidney condition. He experiences fatigue but has not noticed any blood in his stool or changes in bowel movement color.  He has neuropathy in his feet, which has not worsened recently. He attributes some improvement to adjustments in his footwear, although he still finds it difficult to wear socks.  He uses a Dexcom to monitor his glucose levels, which have been stable, with a recent A1c of 6.1%.  [place] golfing, although he now uses a cart for 18 holes due to his neuropathy. He has been to the eye doctor within the last year and obtained new glasses, which will be a year old in the fall.      Patient Active Problem List   Diagnosis Date Noted   Preventative health care 12/10/2020   Anemia 10/19/2020   Malignant neoplasm of prostate (HCC) 05/11/2020   Atypical small  acinar proliferation of prostate 03/09/2020   Uncontrolled type 2 diabetes mellitus with hyperglycemia (HCC) 10/02/2019   Urinary frequency 10/02/2019   Chills (without fever) 10/02/2019   Dysuria 09/19/2019   Stage 3b chronic kidney disease (HCC) 09/19/2019   Urinary tract infection without hematuria 08/08/2019   ETD (eustachian tube dysfunction) 06/08/2018   Acute left-sided low back pain with left-sided sciatica 05/22/2017   Tingling in extremities 08/14/2016   Arthralgia 08/14/2016   Arthritis 05/29/2016   De Quervain's tenosynovitis, right 05/29/2016   Sleep apnea 12/06/2015   Type 2 diabetes mellitus without complication, without long-term current use of insulin (HCC) 06/19/2015   Light headedness 01/11/2015   Gastroesophageal reflux disease without esophagitis 02/13/2014   Gout 01/27/2014   Hypothyroidism following radioiodine therapy 12/08/2013   Sinusitis 08/25/2013   Multinodular goiter 01/22/2013   HTN (hypertension) 11/25/2012   Hyperlipidemia LDL goal <70 11/25/2012   Left otitis media 11/25/2012   Low testosterone 11/25/2012   Past Medical History:  Diagnosis Date   Anemia    Cataract    Colon polyps    Diabetes mellitus    Environmental allergies    Hyperlipidemia    Hypertension    Hyperthyroidism    Kidney stones    Low testosterone    Prostate cancer (HCC)    Sleep apnea    Past Surgical History:  Procedure Laterality Date   CATARACT EXTRACTION Left 09/2021  CATARACT EXTRACTION Right 11/2021   PROSTATE BIOPSY     VASECTOMY     Social History   Tobacco Use   Smoking status: Never   Smokeless tobacco: Never  Vaping Use   Vaping status: Never Used  Substance Use Topics   Alcohol use: Yes    Comment: rare - on social occasions   Drug use: No   Social History   Socioeconomic History   Marital status: Married    Spouse name: Not on file   Number of children: 2   Years of education: Not on file   Highest education level: Not on file   Occupational History   Occupation: Location manager  Tobacco Use   Smoking status: Never   Smokeless tobacco: Never  Vaping Use   Vaping status: Never Used  Substance and Sexual Activity   Alcohol use: Yes    Comment: rare - on social occasions   Drug use: No   Sexual activity: Yes  Other Topics Concern   Not on file  Social History Narrative   Exercise----not as much--- 1-2 x a week    Social Drivers of Corporate investment banker Strain: Not on file  Food Insecurity: Not on file  Transportation Needs: Not on file  Physical Activity: Not on file  Stress: Not on file  Social Connections: Not on file  Intimate Partner Violence: Not on file   Family Status  Relation Name Status   Mother  Deceased at age 20       chf   Father  Deceased at age 51   Sister deb Alive   Sister  Alive   Brother  English as a second language teacher  (Not Specified)   Other  (Not Specified)   Neg Hx  (Not Specified)  No partnership data on file   Family History  Problem Relation Age of Onset   Colon cancer Mother    Heart disease Mother    Hypertension Mother        Mother's side of the family   Diabetes Mother        Mother's side of the family   Hyperlipidemia Sister    Hypertension Sister    Diabetes Sister    Heart disease Sister    Osteoarthritis Sister    Diabetes Sister    Colon cancer Brother    Cancer Paternal Aunt        unknown type   Rectal cancer Other    Stomach cancer Other    Heart disease Other    Diabetes Other        father's family   Breast cancer Neg Hx    Pancreatic cancer Neg Hx    Allergies  Allergen Reactions   [name] [Yohimbine] Palpitations      Review of Systems  Constitutional:  Negative for chills, fever and malaise/fatigue.  HENT:  Negative for congestion and hearing loss.   Eyes:  Negative for blurred vision and discharge.  Respiratory:  Negative for cough, sputum production and shortness of breath.   Cardiovascular:  Negative for chest pain,  palpitations and leg swelling.  Gastrointestinal:  Negative for abdominal pain, blood in stool, constipation, diarrhea, heartburn, nausea and vomiting.  Genitourinary:  Negative for dysuria, frequency, hematuria and urgency.  Musculoskeletal:  Positive for joint pain. Negative for back pain, falls and myalgias.  Skin:  Negative for rash.  Neurological:  Negative for dizziness, sensory change, loss of consciousness, weakness and headaches.  Endo/Heme/Allergies:  Negative for  environmental allergies. Does not bruise/bleed easily.  Psychiatric/Behavioral:  Negative for depression and suicidal ideas. The patient is not nervous/anxious and does not have insomnia.       Objective:     BP 122/78 (BP Location: Left Arm, Patient Position: Sitting, Cuff Size: Large)   Pulse 70   Temp 98.9 F (37.2 C) (Oral)   Resp 18   Ht 6\' 2"  (1.88 m)   Wt 293 lb 12.8 oz (133.3 kg)   SpO2 97%   BMI 37.72 kg/m  BP Readings from Last 3 Encounters:  07/05/23 122/78  05/01/23 120/70  12/29/22 124/70   Wt Readings from Last 3 Encounters:  07/05/23 293 lb 12.8 oz (133.3 kg)  05/01/23 292 lb 12.8 oz (132.8 kg)  12/29/22 298 lb 12.8 oz (135.5 kg)   SpO2 Readings from Last 3 Encounters:  07/05/23 97%  05/01/23 96%  12/29/22 96%    Physical Exam Vitals and nursing note reviewed.  Constitutional:      General: He is not in acute distress.    Appearance: Normal appearance. He is well-developed.  HENT:     Head: Normocephalic and atraumatic.  Eyes:     General: No scleral icterus.       Right eye: No discharge.        Left eye: No discharge.  Cardiovascular:     Rate and Rhythm: Normal rate and regular rhythm.     Heart sounds: No murmur heard. Pulmonary:     Effort: Pulmonary effort is normal. No respiratory distress.     Breath sounds: Normal breath sounds.  Musculoskeletal:        General: Swelling and tenderness present.     Cervical back: Normal range of motion and neck supple.     Right  knee: Swelling present. Decreased range of motion. Tenderness present over the medial joint line, MCL and LCL.     Left knee: Normal.     Right lower leg: No edema.     Left lower leg: No edema.  Skin:    General: Skin is warm and dry.  Neurological:     Mental Status: He is alert and oriented to person, place, and time.  Psychiatric:        Mood and Affect: Mood normal.        Behavior: Behavior normal.        Thought Content: Thought content normal.        Judgment: Judgment normal.      No results found for any visits on 07/05/23.  Last CBC Lab Results  Component Value Date   WBC 4.9 11/24/2022   HGB 12.3 (L) 11/24/2022   HCT 38.2 (L) 11/24/2022   MCV 94.5 11/24/2022   MCH 31.1 08/01/2019   RDW 13.9 11/24/2022   PLT 274.0 11/24/2022   Last metabolic panel Lab Results  Component Value Date   GLUCOSE 122 (H) 11/24/2022   NA 136 11/24/2022   K 4.8 11/24/2022   CL 103 11/24/2022   CO2 26 11/24/2022   BUN 44 (H) 11/24/2022   CREATININE 1.56 (H) 11/24/2022   GFR 46.53 (L) 11/24/2022   CALCIUM 9.4 11/24/2022   PROT 7.2 11/24/2022   ALBUMIN 4.7 11/24/2022   BILITOT 0.4 11/24/2022   ALKPHOS 38 (L) 11/24/2022   AST 32 11/24/2022   ALT 31 11/24/2022   ANIONGAP 8 08/01/2019   Last lipids Lab Results  Component Value Date   CHOL 178 11/24/2022   HDL 35.50 (L) 11/24/2022  LDLCALC 111 (H) 11/24/2022   LDLDIRECT 107.0 06/10/2020   TRIG 157.0 (H) 11/24/2022   CHOLHDL 5 11/24/2022   Last hemoglobin A1c Lab Results  Component Value Date   HGBA1C 6.1 (A) 05/01/2023   Last thyroid functions Lab Results  Component Value Date   TSH 2.30 11/24/2022   T4TOTAL 6.0 04/24/2014   Last vitamin D No results found for: "25OHVITD2", "25OHVITD3", "VD25OH" Last vitamin B12 and Folate Lab Results  Component Value Date   VITAMINB12 1,487 (H) 10/25/2021      The 10-year ASCVD risk score (Arnett DK, et al., 2019) is: 27.9%    Assessment & Plan:   Problem List Items  Addressed This Visit       Unprioritized   Anemia   Relevant Orders   IBC + Ferritin   Vitamin B12   Fecal occult blood, imunochemical([facility])   Stage 3b chronic kidney disease (HCC)   Relevant Orders   Comprehensive metabolic panel   Hypothyroidism following radioiodine therapy   Relevant Orders   TSH   HTN (hypertension)   Relevant Orders   Comprehensive metabolic panel   Other Visit Diagnoses       Primary osteoarthritis of right knee    -  Primary   Relevant Orders   Ambulatory referral to Sports Medicine     Type 2 diabetes mellitus without complication, with long-term current use of insulin (HCC)       Relevant Medications   Insulin Syringe-Needle U-100 (BD INSULIN SYRINGE U/F) 31G X 5/16" 1 ML MISC   Other Relevant Orders   Comprehensive metabolic panel     Hyperlipidemia associated with type 2 diabetes mellitus (HCC)       Relevant Orders   Lipid panel   CBC with Differential/Platelet     Assessment and Plan    Right Knee Osteoarthritis   Chronic right knee osteoarthritis with worsening pain, exacerbated by exercise. He has been using ibuprofen for pain relief despite contraindications due to kidney disease. Interested in gel injections for the knee, but insurance typically requires prior steroid injections. A referral to sports medicine for evaluation of eligibility for gel injections has been made.  Anemia   Chronic anemia likely related to kidney disease. Reports fatigue and has not been taking iron supplements due to kidney concerns. Plan to check iron levels and consider referral to hematology for potential iron infusions if levels are low.  Chronic Kidney Disease   Chronic kidney disease impacts the management of anemia and vitamin D deficiency. He is not taking supplements due to kidney concerns.  Vitamin D Deficiency   Chronic vitamin D deficiency, not currently treated with supplements due to kidney concerns. Advised to increase sun exposure  for natural vitamin D synthesis.  Diabetes Mellitus   Type 2 diabetes mellitus managed with insulin. Recent A1c was 6.1, with current levels around 6.3, indicating good control. He monitors glucose levels with a Dexcom device. Continue current diabetes management and monitoring.  Diabetic Neuropathy   Chronic diabetic neuropathy in the feet is well-managed with adjusted footwear and avoiding socks due to discomfort.  General Health Maintenance   Had an eye exam within the last year and is due for another in the fall. Schedule eye exam in the fall.  Follow-up   Advised to follow up with the sports medicine specialist and to contact the office if there is no response within a week. A follow-up appointment with the primary care provider is scheduled for six months.  Return in about 6 months (around 01/05/2024), or if symptoms worsen or fail to improve.    Donato Schultz, DO

## 2023-07-06 LAB — FECAL OCCULT BLOOD, IMMUNOCHEMICAL: Fecal Occult Bld: NEGATIVE

## 2023-07-09 ENCOUNTER — Other Ambulatory Visit: Payer: Self-pay | Admitting: Family Medicine

## 2023-07-09 NOTE — Progress Notes (Unsigned)
 Rubin Payor, PhD, LAT, ATC acting as a scribe for Clementeen Graham, MD.  HAMMAD FINKLER is a 68 y.o. male who presents to [facility] today for R knee pain. Pt was previously seen by Dr. Denyse Amass in 2021 and was given a R knee steroid injection.  Prior steroid injection he thinks lasted about 8 months.  He worked pretty well for 8 months but then pain returned.  Today, pt c/o R knee pain never fully resolved, but was exacerbated over the past 2-wks. Pain seems to be exacerbated from recently starting to exercise. Pt locates pain to the anterior aspect of his R knee and deep w/in the joint. +swelling.  Dx imaging: 03/19/20 R knee XR  Pertinent review of systems: No fevers or chills  Relevant historical information: Hypertension and diabetes.   Exam:  BP 124/80   Pulse 69   Ht 6\' 2"  (1.88 m)   Wt 291 lb (132 kg)   SpO2 95%   BMI 37.36 kg/m  General: Well Developed, well nourished, and in no acute distress.   MSK: Right knee moderate effusion normal motion with crepitation.    Lab and Radiology Results  Procedure: Real-time Ultrasound Guided Injection of right knee joint superior lateral patella space Device: Philips Affiniti 50G/GE Logiq Images permanently stored and available for review in PACS Verbal informed consent obtained.  Discussed risks and benefits of procedure. Warned about infection, bleeding, hyperglycemia damage to structures among others. Patient expresses understanding and agreement Time-out conducted.   Noted no overlying erythema, induration, or other signs of local infection.   Skin prepped in a sterile fashion.   Local anesthesia: Topical Ethyl chloride.   With sterile technique and under real time ultrasound guidance: 40 mg of Kenalog and 2 mL of Marcaine injected into knee joint. Fluid seen entering the joint capsule.   Completed without difficulty   Pain immediately resolved suggesting accurate placement of the  medication.   Advised to call if fevers/chills, erythema, induration, drainage, or persistent bleeding.   Images permanently stored and available for review in the ultrasound unit.  Impression: Technically successful ultrasound guided injection.    X-ray images obtained today personally and independently interpreted. Moderate to severe medial DJD.  Severe patellofemoral DJD.  No acute fractures are visible. Await formal radiology review   Assessment and Plan: 68 y.o. male with chronic right knee pain due to DJD.  Plan for repeat steroid injection.  If this injection does not last long enough or work well enough next step would be gel injections which we could get authorized.  He will keep me updated with how he is doing.  Ultimately he may require knee replacement at some point in the future.   PDMP not reviewed this encounter. Orders Placed This Encounter  Procedures   [place] LIMITED JOINT SPACE STRUCTURES LOW RIGHT(NO LINKED CHARGES)    Reason for Exam (SYMPTOM  OR DIAGNOSIS REQUIRED):   right knee pain    Preferred imaging location?:   [facility] Knee AP/LAT W/Sunrise Right    Standing Status:   Future    Number of Occurrences:   1    Expiration Date:   08/10/2023    Reason for Exam (SYMPTOM  OR DIAGNOSIS REQUIRED):   right knee pain    Preferred imaging location?:   [facility] [place]   No orders of the defined types were placed in this encounter.    Discussed warning signs  or symptoms. Please see discharge instructions. Patient expresses understanding.   The above documentation has been reviewed and is accurate and complete Clementeen Graham, M.D.

## 2023-07-10 ENCOUNTER — Ambulatory Visit: Admitting: Family Medicine

## 2023-07-10 ENCOUNTER — Ambulatory Visit: Payer: Medicare Other | Admitting: Psychology

## 2023-07-10 ENCOUNTER — Ambulatory Visit (INDEPENDENT_AMBULATORY_CARE_PROVIDER_SITE_OTHER)

## 2023-07-10 ENCOUNTER — Other Ambulatory Visit: Payer: Self-pay

## 2023-07-10 VITALS — BP 124/80 | HR 69 | Ht 74.0 in | Wt 291.0 lb

## 2023-07-10 DIAGNOSIS — M25561 Pain in right knee: Secondary | ICD-10-CM

## 2023-07-10 DIAGNOSIS — M1711 Unilateral primary osteoarthritis, right knee: Secondary | ICD-10-CM | POA: Diagnosis not present

## 2023-07-10 DIAGNOSIS — G8929 Other chronic pain: Secondary | ICD-10-CM | POA: Diagnosis not present

## 2023-07-10 DIAGNOSIS — M25461 Effusion, right knee: Secondary | ICD-10-CM | POA: Diagnosis not present

## 2023-07-10 NOTE — Patient Instructions (Signed)
Thank you for coming in today.   Please get an Xray today before you leave   You received an injection today. Seek immediate medical attention if the joint becomes red, extremely painful, or is oozing fluid.   Check back as needed

## 2023-07-13 ENCOUNTER — Other Ambulatory Visit: Payer: Self-pay | Admitting: Family Medicine

## 2023-07-21 ENCOUNTER — Encounter: Payer: Self-pay | Admitting: Family Medicine

## 2023-07-23 ENCOUNTER — Ambulatory Visit
Admission: EM | Admit: 2023-07-23 | Discharge: 2023-07-23 | Disposition: A | Discharge status: Home / Self Care | Attending: Emergency Medicine | Admitting: Emergency Medicine

## 2023-07-23 ENCOUNTER — Other Ambulatory Visit: Payer: Self-pay

## 2023-07-23 ENCOUNTER — Encounter: Payer: Self-pay | Admitting: Family Medicine

## 2023-07-23 ENCOUNTER — Other Ambulatory Visit: Payer: Self-pay | Admitting: Family Medicine

## 2023-07-23 ENCOUNTER — Encounter: Payer: Self-pay | Admitting: Emergency Medicine

## 2023-07-23 DIAGNOSIS — R5383 Other fatigue: Secondary | ICD-10-CM

## 2023-07-23 DIAGNOSIS — A084 Viral intestinal infection, unspecified: Secondary | ICD-10-CM | POA: Diagnosis not present

## 2023-07-23 LAB — POC COVID19/FLU A&B COMBO
Covid Antigen, POC: NEGATIVE
Influenza A Antigen, POC: NEGATIVE
Influenza B Antigen, POC: NEGATIVE

## 2023-07-23 MED ORDER — AZITHROMYCIN 500 MG PO TABS: 500.0000 mg | ORAL_TABLET | Freq: Every day | ORAL | 0 refills | Status: AC

## 2023-07-23 MED ORDER — LOPERAMIDE HCL 2 MG PO CAPS: 2.0000 mg | ORAL_CAPSULE | Freq: Four times a day (QID) | ORAL | 0 refills | Status: DC | PRN

## 2023-07-23 NOTE — Progress Notes (Signed)
 Right knee x-ray shows medium arthritis.  This looks a little worse compared to prior x-rays.

## 2023-07-23 NOTE — Discharge Instructions (Addendum)
 Your symptoms are most likely caused by a virus, it will work its way out your system over the next few days if no improvement seen by Friday may pick up azithromycin from the pharmacy  Covid and flu test negatvie   You can use Imodium  to help with diarrhea, and be mindful over use of this medication may cause opposite effect constipation  You can use over-the-counter ibuprofen or Tylenol, which ever you have at home, to help manage fevers  Continue to promote hydration throughout the day by using electrolyte replacement solution such as Gatorade, body armor, Pedialyte, which ever you have at home  Try eating bland foods such as bread, rice, toast, fruit which are easier on the stomach to digest, avoid foods that are overly spicy, overly seasoned or greasy

## 2023-07-23 NOTE — ED Triage Notes (Signed)
 Patient presents to [facility] for evaluation of weakness after helping set up a church event on Saturday that made him go home and take a nap.  Sunday he woke up, still felt bad, but no specific symptoms.  Sunday, after church, he began to have diarrhea and has persisted since.  Denies abdominal pain.  C/o chills and feeling hot, but unknown if fever.  Patient state she is also anemic right now

## 2023-07-24 NOTE — ED Provider Notes (Signed)
 Alex Rocha    CSN: [id number] Arrival date & time: 07/23/23  1817      History   Chief Complaint Chief Complaint  Patient presents with   Diarrhea    HPI Alex Rocha is a 68 y.o. male.   Patient presents for evaluation of increased fatigue and malaise present for 2 days, beginning yesterday evening experiencing of subjective fever and watery diarrhea persisting into today.  Unable to tolerate food and liquids.  No known sick contacts prior.  Has attempted use of Pepto-Bismol.  Denies abdominal pain, nausea or vomiting, URI symptoms.  Past Medical History:  Diagnosis Date   Anemia    Cataract    Colon polyps    Diabetes mellitus    Environmental allergies    Hyperlipidemia    Hypertension    Hyperthyroidism    Kidney stones    Low testosterone    Prostate cancer [facility])    Sleep apnea     Patient Active Problem List   Diagnosis Date Noted   Preventative health care 12/10/2020   Anemia 10/19/2020   Malignant neoplasm of prostate (HCC) 05/11/2020   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   Uncontrolled type 2 diabetes mellitus with hyperglycemia (HCC) 10/02/2019   Urinary frequency 10/02/2019   Chills (without fever) 10/02/2019   Dysuria 09/19/2019   Stage 3b chronic kidney disease (HCC) 09/19/2019   Urinary tract infection without hematuria 08/08/2019   ETD (eustachian tube dysfunction) 06/08/2018   Acute left-sided low back pain with left-sided sciatica 05/22/2017   Tingling in extremities 08/14/2016   Arthralgia 08/14/2016   Arthritis 05/29/2016   De Quervain's tenosynovitis, right 05/29/2016   Sleep apnea 12/06/2015   Type 2 diabetes mellitus without complication, without long-term current use of insulin (HCC) 06/19/2015   Light headedness 01/11/2015   Gastroesophageal reflux disease without esophagitis 02/13/2014   Gout 01/27/2014   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 Authorizing Provider  azithromycin (ZITHROMAX) 500 MG tablet Take 1 tablet (500 mg total) by mouth daily for 3 days. 07/27/23 07/30/23 Yes Jadie Allington, Elita Boone, NP  loperamide (IMODIUM) 2 MG capsule Take 1 capsule (2 mg total) by mouth 4 (four) times daily as needed for diarrhea or loose stools. 07/23/23  Yes Aidynn Polendo, Elita Boone, NP  acetic acid-hydrocortisone (VOSOL-HC) OTIC solution Place 5 drops into both ears 2 (two) times daily. 03/09/23   Donato Schultz, DO  Cinnamon 500 MG capsule Take 500 mg by mouth daily.    [provider]  co-enzyme Q-10 30 MG capsule Take 30 mg by mouth 3 (three) times daily.    [provider]  Continuous Glucose Sensor (DEXCOM G7 SENSOR) MISC 3 each by Does not apply route every 30 (thirty) days. Apply 1 sensor every 10 days 12/29/22   Carlus Pavlov, MD  diclofenac sodium (VOLTAREN) 1 % GEL APPLY 4 G TOPICALLY 4 (FOUR) TIMES DAILY. 03/04/18   Seabron Spates R, DO  fenofibrate 160 MG  tablet TAKE 1 TABLET (160 MG TOTAL) BY MOUTH DAILY. PT NEEDS OFFICE VISIT FOR FURTHER REFILLS. 07/10/23   Seabron Spates R, DO  fluticasone (FLONASE) 50 MCG/ACT nasal spray Place 2 sprays into both nostrils daily. 05/05/22   Zola Button, Myrene Buddy R, DO  insulin NPH Human (HUMULIN N) 100 UNIT/ML injection INJECT 80 UNITS EACH MORNING, AND 60 UNITS EACH EVENING. 02/05/23   Zola Button, Yvonne R, DO  Insulin Syringe-Needle U-100 (BD INSULIN SYRINGE U/F) 31G X 5/16" 1 ML MISC 1 EACH BY OTHER ROUTE 2 (TWO) TIMES DAILY. AS DIRECTED 07/05/23    Zola Button, Yvonne R, DO  JARDIANCE 10 MG TABS tablet TAKE 1 TABLET BY MOUTH DAILY BEFORE BREAKFAST. 05/15/23   Carlus Pavlov, MD  levothyroxine (SYNTHROID) 112 MCG tablet Take 1 tablet (112 mcg total) by mouth daily before breakfast. 07/13/23   Zola Button, Grayling Congress, DO  loratadine (CLARITIN) 10 MG tablet TAKE 1 TABLET BY MOUTH EVERY DAY 11/17/21   Zola Button, Grayling Congress, DO  losartan (COZAAR) 100 MG tablet Take 1 tablet (100 mg total) by mouth daily. 07/13/23   Seabron Spates R, DO  Omega 3-6-9 Fatty Acids (OMEGA 3-6-9 COMPLEX PO) Take by mouth.    [provider]  tamsulosin (FLOMAX) 0.4 MG CAPS capsule Take 0.4 mg by mouth daily. 04/28/20   [provider]  Turmeric 1053 MG TABS Take by mouth.    [provider]    Family History Family History  Problem Relation Age of Onset   Colon cancer Mother    Heart disease Mother    Hypertension Mother        Mother's side of the family   Diabetes Mother        Mother's side of the family   Hyperlipidemia Sister    Hypertension Sister    Diabetes Sister    Heart disease Sister    Osteoarthritis Sister    Diabetes Sister    Colon cancer Brother    Cancer Paternal Aunt        unknown type   Rectal cancer Other    Stomach cancer Other    Heart disease Other    Diabetes Other        father's family   Breast cancer Neg Hx    Pancreatic cancer Neg Hx     Social History Social History   Tobacco Use   Smoking status: Never   Smokeless tobacco: Never  Vaping Use   Vaping status: Never Used  Substance Use Topics   Alcohol use: Yes    Comment: rare - on social occasions   Drug use: No     Allergies   [name] [yohimbine]   Review of Systems Review of Systems  Gastrointestinal:  Positive for diarrhea.     Physical Exam Triage Vital Signs ED Triage Vitals  Encounter Vitals Group     BP 07/23/23 1931 136/75     Systolic BP Percentile --      Diastolic BP Percentile --      Pulse Rate  07/23/23 1931 80     Resp 07/23/23 1931 18     Temp 07/23/23 1931 98.9 F (37.2 C)     Temp Source 07/23/23 1931 Oral     SpO2 07/23/23 1931 98 %     Weight --      Height --      Head Circumference --      Peak Flow --  Pain Score 07/23/23 1932 0     Pain Loc --      Pain Education --      Exclude from Growth Chart --    No data found.  Updated Vital Signs BP 136/75 (BP Location: Left Arm)   Pulse 80   Temp 98.9 F (37.2 C) (Oral)   Resp 18   SpO2 98%   Visual Acuity Right Eye Distance:   Left Eye Distance:   Bilateral Distance:    Right Eye Near:   Left Eye Near:    Bilateral Near:     Physical Exam Constitutional:      Appearance: Normal appearance.  Eyes:     Extraocular Movements: Extraocular movements intact.  Pulmonary:     Effort: Pulmonary effort is normal.  Abdominal:     General: Abdomen is flat. Bowel sounds are increased.     Palpations: Abdomen is soft.     Tenderness: There is no abdominal tenderness.  Neurological:     Mental Status: He is alert and oriented to person, place, and time. Mental status is at baseline.      UC Treatments / Results  Labs (all labs ordered are listed, but only abnormal results are displayed) Labs Reviewed  POC COVID19/FLU A&B COMBO - Normal    EKG   Radiology No results found.  Procedures Procedures (including critical care time)  Medications Ordered in UC Medications - No data to display  Initial Impression / Assessment and Plan / UC Course  I have reviewed the triage vital signs and the nursing notes.  Pertinent labs & imaging results that were available during my care of the patient were reviewed by me and considered in my medical decision making (see chart for details).  Viral gastroenteritis  Vital signs are stable, patient in no signs of distress nontoxic-appearing, COVID and flu testing negative, discussed, increased bowel sounds on exam but no abdominal tenderness, stable for  outpatient management low suspicion for an acutely inflamed process, etiology most likely viral, prescribed Imodium for home use and watchful wait antibiotic placed at pharmacy if no pain, advise increase fluid intake to maintain hydration with food as tolerated, may follow-up with urgent care as needed Final Clinical Impressions(s) / UC Diagnoses   Final diagnoses:  Viral gastroenteritis     Discharge Instructions      Your symptoms are most likely caused by a virus, it will work its way out your system over the next few days if no improvement seen by Friday may pick up azithromycin from the pharmacy  Covid and flu test negatvie   You can use Imodium  to help with diarrhea, and be mindful over use of this medication may cause opposite effect constipation  You can use over-the-counter ibuprofen or Tylenol, which ever you have at home, to help manage fevers  Continue to promote hydration throughout the day by using electrolyte replacement solution such as Gatorade, body armor, Pedialyte, which ever you have at home  Try eating bland foods such as bread, rice, toast, fruit which are easier on the stomach to digest, avoid foods that are overly spicy, overly seasoned or greasy    ED Prescriptions     Medication Sig Dispense Auth. Provider   loperamide (IMODIUM) 2 MG capsule Take 1 capsule (2 mg total) by mouth 4 (four) times daily as needed for diarrhea or loose stools. 12 capsule Dreyton Roessner R, NP   azithromycin (ZITHROMAX) 500 MG tablet Take 1 tablet (500 mg total)  by mouth daily for 3 days. 3 tablet Valinda Hoar, NP      PDMP not reviewed this encounter.   Valinda Hoar, [place] 07/24/23 [phone]

## 2023-07-25 ENCOUNTER — Encounter: Payer: Medicare Other | Attending: Psychology | Admitting: Psychology

## 2023-07-25 DIAGNOSIS — Z794 Long term (current) use of insulin: Secondary | ICD-10-CM

## 2023-07-25 DIAGNOSIS — I10 Essential (primary) hypertension: Secondary | ICD-10-CM | POA: Diagnosis not present

## 2023-07-25 DIAGNOSIS — I6990 Unspecified sequelae of unspecified cerebrovascular disease: Secondary | ICD-10-CM | POA: Diagnosis not present

## 2023-07-25 DIAGNOSIS — E119 Type 2 diabetes mellitus without complications: Secondary | ICD-10-CM | POA: Diagnosis not present

## 2023-07-25 DIAGNOSIS — R413 Other amnesia: Secondary | ICD-10-CM | POA: Diagnosis not present

## 2023-07-25 DIAGNOSIS — G4733 Obstructive sleep apnea (adult) (pediatric): Secondary | ICD-10-CM | POA: Diagnosis not present

## 2023-08-02 ENCOUNTER — Ambulatory Visit: Payer: Medicare Other | Admitting: Psychology

## 2023-08-21 ENCOUNTER — Ambulatory Visit: Payer: Medicare Other | Admitting: Psychology

## 2023-09-12 ENCOUNTER — Encounter: Payer: Self-pay | Admitting: Psychology

## 2023-09-12 NOTE — Progress Notes (Signed)
 Neuropsychological Evaluation   Patient:  Alex Rocha   DOB: Jun 03, 1955  MR Number: [id number]  Location: [facility] [address] [place], [address] [place] [address] Dept: [phone]  Start: 11 AM End: 12 PM  Provider/Observer:     Marrion Sjogren PsyD  Chief Complaint:      Chief Complaint  Patient presents with   Memory Loss   Other    Geographic disorientation and visual spatial changes       Patient Information: Name:  Alex Rocha  Age: 68 years old Referring Physician: Roel Clarity, DO  Reason for Referral: Patient was referred for neuropsychological consultation and evaluation due to memory issues, particularly short-term semantic and episodic memory, and geographic disorientation. Medical History: Conditions: Prostate cancer, type 2 diabetes (insulin  use), hyperglycemia, obstructive sleep apnea (compliant with CPAP), stage III chronic kidney disease, GERD, hypertension, hyperlipidemia. Memory Issues: Worsening short-term memory over the past year, difficulty remembering paperwork, appointments, names, and other factual information. Episodes of geographic disorientation while driving familiar roads. Sleep and Fatigue: Sleep Apnea: Diagnosed many years ago, uses CPAP machine diligently. Fatigue: Few naps during the day, periods of fatigue related to anemia and difficulty staying asleep at night. Family History: Dementia: No family history of progressive dementia symptoms. Siblings: Youngest of his siblings, spends more time helping them, recent family loss but no significant emotional distress or excessive worry. 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 History of concussions, one significant and many smaller ones. 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 Memory Strategies: Compensation: Writes down information and uses other strategies to aid memory. Family regularly helps him find things he forgets. Improvement: No longer feels lost while traveling, though he may still go in the wrong direction. Medical History:                         Past Medical History:  Diagnosis Date   Anemia     Cataract     Colon polyps     Diabetes mellitus     Environmental allergies     Hyperlipidemia     Hypertension     Hyperthyroidism     Kidney stones     Low testosterone     Prostate cancer [facility])     Sleep apnea                                                                 Patient Active Problem List    Diagnosis Date Noted   Preventative health care 12/10/2020   Anemia 10/19/2020   Malignant neoplasm of prostate (HCC) 05/11/2020   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   Uncontrolled type 2 diabetes mellitus with hyperglycemia (HCC) 10/02/2019   Urinary frequency 10/02/2019   Chills (without fever) 10/02/2019   Dysuria 09/19/2019   Stage 3b chronic kidney disease (HCC) 09/19/2019   Urinary tract infection without hematuria 08/08/2019   ETD (eustachian tube dysfunction) 06/08/2018   Acute left-sided low back pain with left-sided sciatica 05/22/2017   Tingling in extremities 08/14/2016   Arthralgia 08/14/2016  Arthritis 05/29/2016   De Quervain's tenosynovitis, right 05/29/2016   Sleep apnea 12/06/2015   Type 2 diabetes mellitus without complication, without long-term current use of insulin  (HCC) 06/19/2015   Light headedness 01/11/2015   Gastroesophageal reflux disease without esophagitis 02/13/2014   Gout 01/27/2014   Hypothyroidism following radioiodine therapy 12/08/2013   Sinusitis 08/25/2013   Multinodular goiter 01/22/2013   HTN (hypertension) 11/25/2012   Hyperlipidemia LDL goal <70 11/25/2012   Left otitis media 11/25/2012   Low testosterone 11/25/2012      Onset and Duration  of Symptoms: Symptoms began developing a little over a year ago with short-term memory changes and disturbance in identifying where he was when traveling.   Progression of Symptoms: Patient reports that he is not having many issues with geographic orientation but is having worsening issues with new learning/short-term memory with semantic greater than episodic symptoms. Additional Tests and Measures from other records:   Neuroimaging Results: The patient had an MRI conducted on 04/26/2022 after identifying changes in mental status, short-term memory, episodes of confusion and past history of prostate cancer.  This MRI was interpreted by Bascom Lily, DO.  Multifocal T2 flair hyperintense signal abnormalities were noted within the cerebral white matter, nonspecific but compatible with mild chronic small vessel ischemic disease.  There are also chronic microhemorrhages noted within the mid right frontal lobe and right temporal occipital junction.  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   **the findings noted in the MRI review would be consistent with a sudden development of visual spatial and geographic disorientation due to right frontotemporoparietal junction region of the brain and the right frontal lobe potentially affecting retrieval of recently learned information.   Laboratory Tests: Patient has been diligently working with endocrinologist around his diabetes but continues with elevations in his A1c.  Patient also identified with hyperlipidemia and hypercholesterolemia and lab work along with elevated blood glucose levels but better managed now.  Anemia also noted in blood work.  B vitamins within normal limits but it does appear he may be taking B12 with recent elevations of B12 1 year ago versus 6 years ago.   Sleep: Patient notes that his sleep is very inconsistent.  Patient notes that often times he will wake up during the night and have difficulty  going back to sleep.  The patient was diagnosed with obstructive sleep apnea and is diligent with his CPAP use.  He is not taking naps very often.   Diet Pattern: Patient is working on having a good diet and is working closely with his endocrinologist around his diabetes and working with nephrology around his chronic kidney disease.  Tests Administered: Controlled Oral Word Association Test (COWAT; FAS & Animals)  Wechsler Adult Intelligence Scale, 4th Edition (WAIS-IV) Wechsler Memory Scale, 4th Edition (WMS-IV); Adult Battery   Participation Level:   Active  Participation Quality:  Appropriate      Behavioral Observation:  The patient appeared well-groomed and appropriately dressed. His manners were polite and appropriate to the situation. The patient's attitude towards testing was positive and his effort was good.   Well Groomed, Alert, and Appropriate.   Test Results:   Initially, an estimation was made as to premorbid intellectual and cognitive abilities to provide a comparison point for assessing and evaluating current objective neuropsychological test data.  The patient graduated from high school and then attended [employer], where he played football always maintaining a good GPA (3.1).  Patient studied criminal justice and  made the Dean's list prior to graduation.  Patient had 1 semester of graduate school before going into his occupational career.  Patient worked primarily Furniture conservator/restorer.  It is estimated that the patient was likely in the high average range relative to a normative population and we will utilize a premorbid estimate of operating roughly 1 standard deviation above the normative population.  Secondly, an estimation was made as to the validity of the current assessment.  The patient appeared to try his hardest throughout the testing procedures and maintained a positive attitude throughout and displayed good effort.  Embedded validity checks were also well within  normal limits without indication of attempts to mimic any type of difficulties.  This does appear to be a fair and valid assessment of the patient's current cognitive status.  COWAT: FAS total= 40 Z= 0.11 Animals total= 28 Z= 2.21  While the patient has not described any particular changes in expressive language functioning we did want to get a measure of both lexical and semantic fluency.  On the FAS test which assesses lexical fluency the patient performed just above the average score for age, education and gender matched comparison groups.  The patient did very well on semantic fluency measures (animal naming) where he was more than 2 standard deviation above his normative comparisons when matched to age, gender and educational levels.  There were no indications of expressive language changes noted.   WAIS-IV:           Composite Score Summary          Scale Sum of Scaled Scores Composite Score Percentile Rank 95% Conf. Interval Qualitative Description  Verbal Comprehension 36 VCI 110 75 104-115 High Average  Perceptual Reasoning 29 PRI 98 45 92-104 Average  Working Memory 25 WMI 114 82 106-120 High Average  Processing Speed 18 PSI 94 34 86-103 Average  Full Scale 108 FSIQ 105 63 101-109 Average  General Ability 65 GAI 104 61 99-109 Average    To provide a thorough assessment of a wide range of cognitive domains in a highly structured excellently normed battery the patient was administered the Wechsler Adult Intelligence Scale-IV.  While the patient is describing changes in cognitive function in particular memory changes his current score should not be seen as indicative of lifelong patterns but more descriptive of his current status.  2 Global composite scores were calculated in this measure.  The patient produced a full-scale IQ score of 105 which falls at the 63rd percentile and is in the average range and only slightly below predicted levels of premorbid functioning.  A similar  performance was noted for the general abilities index score which places less emphasis on attentional variables including auditory encoding and information processing speed.  The patient produced a general abilities index score of 104 which falls at the 61st percentile and again just below predicted levels of premorbid functioning.  This would suggest that there may be some isolated changes rather than global changes in cognitive functioning.  Individual composite scores show the patient's verbal comprehension to be likely well-maintained without significant change or difference from premorbid estimations.  The patient also was doing well on auditory encoding capacity or he performed consistent with premorbid estimates.  While still in the average range the patient had relative weakness with regard to visual-spatial and visual reasoning components and information processing speed.  However, these were only relative to his premorbid estimates and relative to the other test performances and the patient was still in  the average range in these domains.          Verbal Comprehension Subtests Summary        Subtest Raw Score Scaled Score Percentile Rank Reference Group Scaled Score SEM  Similarities 22 9 37 9 1.04  Vocabulary 51 14 91 15 0.67  Information 20 13 84 14 0.73   The patient showed some relative weakness but still in the average range relative to a normative population on measures of verbal reasoning and problem-solving and showed good maintenance on measures of vocabulary knowledge and his general fund of information.  These are consistent with premorbid estimates overall with some mild weakness with regard to verbal reasoning and problem-solving.  Perceptual Reasoning Subtests Summary        Subtest Raw Score Scaled Score Percentile Rank Reference Group Scaled Score SEM  Block Design 24 8 25 6  1.08  Matrix Reasoning 18 13 84 9 0.90  Visual Puzzles 9 8 25 6  0.85   The patient had some general  scatter in subtest within the perceptual reasoning domain.  His performance on this index was in the average range relative to normative population but this is simplifying some of the scatter noted.  The patient did particularly well and consistent with premorbid functioning on measures of nonverbal reasoning and broad visual intelligence along with perceptual organizational skills.  The patient performed in the lower end of the average range but below predicted levels of premorbid functioning on measures of his capacity for part-whole recognition skills and ability to analyze geometric patterns as well as fluid visual reasoning skills and attention to visual detail.  Working Comptroller Raw Score Scaled Score Percentile Rank Reference Group Scaled Score SEM  Digit Span 27 11 63 9 0.79  Arithmetic 18 14 91 13 0.99   The patient produced a working memory index score of 114 which falls at [facility] and is in the high average range relative to normative population.  The patient did well on measures of primary auditory encoding and exceptionally well on his capacity to actively process information in his auditory Register.  There were no indications of any encoding or processing information in his auditory Register.  Processing Speed Subtests Summary        Subtest Raw Score Scaled Score Percentile Rank Reference Group Scaled Score SEM  Symbol Search 23 9 37 6 1.31  Coding 50 9 37 6 0.99    The patient produced a processing speed index score of 94 which falls in the 34th percentile and is in the average range relative to normative population.  This is roughly 1 standard deviation below predicted levels of premorbid functioning.  There was consistency in subtest performance with the patient performing somewhat slower than predicted levels for visual scanning, visual searching and overall speed of mental operations (focus execute abilities).    WMS-IV:         Index  Score Summary        Index Sum of Scaled Scores Index Score Percentile Rank 95% Confidence Interval Qualitative Descriptor  Auditory Memory (AMI) 48 112 79 105-118 High Average  Visual Memory (VMI) 20 69 2 65-76 Extremely Low  Visual Working Memory (VWMI) 15 85 16 79-93 Low Average  Immediate Memory (IMI) 39 98 45 92-104 Average  Delayed Memory (DMI) 29 81 10 75-89 Low Average    In order to provide an objective assessment of a wide range of attention and  memory domains the patient was administered the Wechsler Memory Scale-IV.  On the Wechsler Adult Intelligence Scale the patient did well on primary auditory encoding and did exceptionally well in his capacity to actively process information in his auditory Register.  The patient had much greater difficulties with regard to visual encoding and active processing of visual information.  On the visual working memory subtest the patient produced an index score of 85 which fell at the 16th percentile in the low average range.  This is roughly 2 standard deviations below predicted levels of premorbid function and significantly below his capacity with regard to auditory encoding.  Breaking memory functions down between auditory versus visual memory produces the most clear and objective findings consistent with the patient's subjective reports.  The patient performed well within predicted levels of premorbid functioning on measures of auditory memory, which is also consistent with his efficient auditory encoding capacity.  The patient produced an auditory memory index score of 112 which falls at the 79th percentile and is in the high average range relative to normative population.  This is an sharp contrast to the patient's performance for visual memory capacity and he produced a visual memory index score of 69 which falls at the 2nd percentile and in the extremely low range relative to normative population.  The patient also showed significant loss of  information over period of delay but this was almost all accounted for for visual memory components where there was a significant loss of information over period of delay.  Also, the patient did not improve nearly as much on cued/recall visual memory challenges versus auditory cued/recall.         Primary Subtest Scaled Score Summary       Subtest Domain Raw Score Scaled Score Percentile Rank  Logical Memory I AM 32 13 84  Logical Memory II AM 28 13 84  Verbal Paired Associates I AM 30 11 63  Verbal Paired Associates II AM 10 11 63  Designs I VM 34 1 0.1  Designs II VM 30 4 2   Visual Reproduction I VM 40 14 91  Visual Reproduction II VM 0 1 0.1  Spatial Addition VWM 8 9 37  Symbol Span VWM 10 6 9               Auditory Memory Process Score Summary      Process Score Raw Score Scaled Score Percentile Rank Cumulative Percentage (Base Rate)  LM II Recognition 26 - - >75%  VPA II Recognition 40 - - >75%         Visual Memory Process Score Summary      Process Score Raw Score Scaled Score Percentile Rank Cumulative Percentage (Base Rate)  DE I Content 15 1 0.1 -  DE I Spatial 7 3 1  -  DE II Content 18 4 2  -  DE II Spatial 6 5 5  -  DE II Recognition 13 - - 26-50%  VR II Recognition 3 - - 10-16%    Summary of Results:   The results of the current neuropsychological evaluation show only minimal change in global cognitive functioning with mild relative weaknesses with regard to visual information processing speed and visual-spatial and visual reasoning and problem-solving capacity.  The patient's language based skills including expressive language (lexical and semantic fluency) vocabulary knowledge and general fund of information all appear to be well-preserved and consistent with premorbid estimates.  The patient also does exceptionally well with regard to auditory encoding capacity and his  ability to have actively process information in his auditory Register.  The patient also does very  well and consistent with premorbid estimates with regard to auditory memory and learning.  There is a marked inconsistent pattern of deficits with regard to visual memory and learning and to a lesser degree visual processing and visual-spatial capacity as well as visual encoding capacities.Rivka Chesterfield memory and visual processing appears to be the primary area of cognitive change.  Impression/Diagnosis:   The results of the current neuropsychological evaluation are generally consistent with the patient's subjective reports although he focuses on short-term memory difficulties that he attributes to both semantic type memories as well as episodic memories.  The patient also notes times of geographic disorientation and acute episodes in the recent past.  I suspect that visual processing disturbance and episodic memory are the greatest difficult areas for him in real-world settings.  The results of neuropsychological assessment are consistent with findings obtained on the MRI performed in January 2024.  While there were multiple T2 flair hyperintense signal abnormalities noted in cerebral white matter they were only consistent with mild chronic small vessel ischemic disease.  However, also noted were chronic microhemorrhages within the mid right frontal lobe, right temporal and occipital junctions and multiple small infarcts in the left cerebellar hemispheres.  The patient's neuropsychological test performance and timeline and description of cognitive changes he is noting are all consistent with what would be expected from findings on his MRI.  The geographic disorientation and episodic memory deficits would be consistent with the right frontotemporal parietal junction region being involved in right frontal lobe regions.  As far as diagnostic considerations, the patient does not show patterns of neuropsychological strengths and weaknesses or clinical symptoms consistent with a degenerative major neurocognitive  disorder such as Alzheimer's, Lewy body etc.  The patient's cognitive changes are very consistent with findings of the MRI performed in 2024.  The acute events of geographic disorientation would potentially be consistent with acute cerebrovascular events.  The more longstanding difficulties with episodic memory and visual spatial changes would also be consistent with these late effects.  As far as treatment recommendations, it is now likely been at least a year and a half to potentially longer since these more chronic microhemorrhages have occurred.  The patient has only mild small vessel disease and I suspect that these microhemorrhages are likely result of hypertensive event with some vulnerabilities due to type 2 diabetes and metabolic issues.  It is critical that the patient closely monitor blood pressure and follow medical advice around managing his history of high blood pressure and type 2 diabetes.  Also working on improving sleep pattern, getting regular sustained physical activity and careful adherence to a good diet will also be important going forward.  There does not appear to be a significant emotional disturbance without significant issues related to depression or anxiety etc.  The patient is coping rather well given these changes.  I will sit down with the patient go over the results of the current neuropsychological evaluation with this feedback being scheduled on April 9.  We will also be setting up the patient for a follow-up roughly 9 months after that feedback if the patient continues to experience progressive decline between the feedback session in January 2026.  Diagnosis:    Late effects of cerebrovascular accident  Memory loss  Obstructive sleep apnea syndrome  Type 2 diabetes mellitus without complication, without long-term current use of insulin  (HCC)   _____________________ Chapman Commodore, Psy.D.  Clinical Neuropsychologist

## 2023-09-12 NOTE — Progress Notes (Signed)
 Neuropsychological Evaluation   Patient:  Alex Rocha   DOB: 1956-01-12  MR Number: [id number]  Location: [facility] [address] [place], [address] [place] [address] Dept: [phone]  Start: 8 AM End: 9 AM  Provider/Observer:     Marrion Sjogren PsyD  Chief Complaint:      Chief Complaint  Patient presents with   Memory Loss   Cerebrovascular Accident   Sleeping Problem   Other    Visual-spatial and visual processing changes   07/25/2023 8 AM-9 AM: Today I provided feedback regarding the results of the recent neuropsychological evaluation.  I have included the background information in patient information as well as the summary of the neuropsychological evaluation below for convenience and the entire neuropsychological evaluative report can be found in the patient's EMR dated 06/12/2022.  We went over the results and reviewed by I feel like that the cognitive changes and memory changes are directly related to previous microvascular insult due to microvascular hemorrhage.  The patient has changes in right frontal, right frontal occipital and left cerebellar brain regions.      Patient Information: Name:  Alex Rocha  Age: 68 years old Referring Physician: Roel Clarity, DO  Reason for Referral: Patient was referred for neuropsychological consultation and evaluation due to memory issues, particularly short-term semantic and episodic memory, and geographic disorientation. Medical History: Conditions: Prostate cancer, type 2 diabetes (insulin  use), hyperglycemia, obstructive sleep apnea (compliant with CPAP), stage III chronic kidney disease, GERD, hypertension, hyperlipidemia. Memory Issues: Worsening short-term memory over the past year, difficulty remembering paperwork, appointments, names, and other factual information. Episodes of geographic disorientation while driving  familiar roads. Sleep and Fatigue: Sleep Apnea: Diagnosed many years ago, uses CPAP machine diligently. Fatigue: Few naps during the day, periods of fatigue related to anemia and difficulty staying asleep at night. Family History: Dementia: No family history of progressive dementia symptoms. Siblings: Youngest of his siblings, spends more time helping them, recent family loss but no significant emotional distress or excessive worry. 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 History of concussions, one significant and many smaller ones. 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 Memory Strategies: Compensation: Writes down information and uses other strategies to aid memory. Family regularly helps him find things he forgets. Improvement: No longer feels lost while traveling, though he may still go in the wrong direction. Impression/Diagnosis:   The results of the current neuropsychological evaluation are generally consistent with the patient's subjective reports although he focuses on short-term memory difficulties that he attributes to both semantic type memories as well as episodic memories.  The patient also notes times of geographic disorientation and acute episodes in the recent past.  I suspect that visual processing disturbance and episodic memory are the greatest difficult areas for him in real-world settings.  The results of neuropsychological assessment are consistent with findings obtained on the MRI performed in January 2024.  While there were multiple T2 flair hyperintense signal abnormalities noted in cerebral white matter they were only consistent with mild chronic small vessel ischemic disease.  However, also noted were chronic microhemorrhages within the mid right frontal lobe, right temporal and occipital junctions and multiple small infarcts in the left cerebellar  hemispheres.  The patient's neuropsychological test performance and timeline and description of cognitive changes he is noting are all consistent  with what would be expected from findings on his MRI.  The geographic disorientation and episodic memory deficits would be consistent with the right frontotemporal parietal junction region being involved in right frontal lobe regions.  As far as diagnostic considerations, the patient does not show patterns of neuropsychological strengths and weaknesses or clinical symptoms consistent with a degenerative major neurocognitive disorder such as Alzheimer's, Lewy body etc.  The patient's cognitive changes are very consistent with findings of the MRI performed in 2024.  The acute events of geographic disorientation would potentially be consistent with acute cerebrovascular events.  The more longstanding difficulties with episodic memory and visual spatial changes would also be consistent with these late effects.  As far as treatment recommendations, it is now likely been at least a year and a half to potentially longer since these more chronic microhemorrhages have occurred.  The patient has only mild small vessel disease and I suspect that these microhemorrhages are likely result of hypertensive event with some vulnerabilities due to type 2 diabetes and metabolic issues.  It is critical that the patient closely monitor blood pressure and follow medical advice around managing his history of high blood pressure and type 2 diabetes.  Also working on improving sleep pattern, getting regular sustained physical activity and careful adherence to a good diet will also be important going forward.  There does not appear to be a significant emotional disturbance without significant issues related to depression or anxiety etc.  The patient is coping rather well given these changes.  I will sit down with the patient go over the results of the current neuropsychological evaluation with  this feedback being scheduled on April 9.  We will also be setting up the patient for a follow-up roughly 9 months after that feedback if the patient continues to experience progressive decline between the feedback session in January 2026.  Diagnosis:    Late effects of cerebrovascular accident  Memory loss  Obstructive sleep apnea syndrome  Type 2 diabetes mellitus without complication, without long-term current use of insulin  (HCC)  Primary hypertension   _____________________ Chapman Commodore, Psy.D. Clinical Neuropsychologist

## 2023-10-11 ENCOUNTER — Ambulatory Visit: Payer: Medicare Other | Admitting: Psychology

## 2023-10-24 ENCOUNTER — Ambulatory Visit: Payer: Self-pay

## 2023-10-24 NOTE — Telephone Encounter (Signed)
 Pt has scheduled OV 10/25/2023.

## 2023-10-24 NOTE — Telephone Encounter (Signed)
 FYI Only or Action Required?: FYI only for provider.  Patient was last seen in primary care on 07/05/2023 by Antonio Meth, Jamee SAUNDERS, DO.  Called Nurse Triage reporting Sinusitis.  Symptoms began several days ago.  Interventions attempted: Nothing.  Symptoms are: gradually worsening.  Triage Disposition: No disposition on file. Apt tomorrow per request. Patient/caregiver understands and will follow disposition?:         Copied from CRM [id number]. Topic: Clinical - Red Word Triage >> Oct 24, 2023  9:15 AM [place] E wrote: [facility] that prompted transfer to Nurse Triage: Possible ear/sinus infection. Sinus pressure, ears clogged. Sharp pain from back of right ear down neck. Symptoms going on for 3 days. Reason for Disposition  [1] SEVERE pain AND [2] not improved 2 hours after pain medicine  Answer Assessment - Initial Assessment Questions 1. LOCATION: Where does it hurt?      Sinus pressure, ears clogged, sharp pain from back of right ear down neck, chills 2. ONSET: When did the sinus pain start?  (e.g., hours, days)      3 days 3. SEVERITY: How bad is the pain?   (Scale 1-10; mild, moderate or severe)   - MILD (1-3): doesn't interfere with normal activities    - MODERATE (4-7): interferes with normal activities (e.g., work or school) or awakens from sleep   - SEVERE (8-10): excruciating pain and patient unable to do any normal activities        moderate 4. RECURRENT SYMPTOM: Have you ever had sinus problems before? If Yes, ask: When was the last time? and What happened that time?      yes 5. NASAL CONGESTION: Is the nose blocked? If Yes, ask: Can you open it or must you breathe through your mouth?     yes 6. NASAL DISCHARGE: Do you have discharge from your nose? If so ask, What color?     no 7. FEVER: Do you have a fever? If Yes, ask: What is it, how was it measured, and when did it start?      Chills  8. OTHER SYMPTOMS: Do you have any other  symptoms? (e.g., sore throat, cough, earache, difficulty breathing)     Earache, sore throat 9. PREGNANCY: Is there any chance you are pregnant? When was your last menstrual period?     na  Protocols used: Sinus Pain or Congestion-A-AH

## 2023-10-25 ENCOUNTER — Ambulatory Visit (INDEPENDENT_AMBULATORY_CARE_PROVIDER_SITE_OTHER): Admitting: Physician Assistant

## 2023-10-25 ENCOUNTER — Encounter: Payer: Self-pay | Admitting: Physician Assistant

## 2023-10-25 VITALS — BP 128/70 | HR 88 | Temp 98.1°F | Ht 74.0 in | Wt 287.6 lb

## 2023-10-25 DIAGNOSIS — J029 Acute pharyngitis, unspecified: Secondary | ICD-10-CM

## 2023-10-25 DIAGNOSIS — H9201 Otalgia, right ear: Secondary | ICD-10-CM | POA: Diagnosis not present

## 2023-10-25 DIAGNOSIS — J020 Streptococcal pharyngitis: Secondary | ICD-10-CM

## 2023-10-25 DIAGNOSIS — H6501 Acute serous otitis media, right ear: Secondary | ICD-10-CM | POA: Diagnosis not present

## 2023-10-25 LAB — POC COVID19 BINAXNOW: SARS Coronavirus 2 Ag: NEGATIVE

## 2023-10-25 LAB — POCT RAPID STREP A (OFFICE): Rapid Strep A Screen: POSITIVE — AB

## 2023-10-25 MED ORDER — AMOXICILLIN 500 MG PO CAPS: 500.0000 mg | ORAL_CAPSULE | Freq: Two times a day (BID) | ORAL | 0 refills | Status: DC

## 2023-10-25 MED ORDER — AZELASTINE HCL 0.1 % NA SOLN: 1.0000 | Freq: Two times a day (BID) | NASAL | 0 refills | Status: DC

## 2023-10-25 NOTE — Progress Notes (Signed)
 Established patient visit   Patient: Alex Rocha   DOB: [birth year]   68 y.o. Male  MRN: [id number] Visit Date: 10/25/2023  Today's healthcare provider: Manuelita Flatness, PA-C   Cc. Ear pain, sore throat  Subjective    Pt reports x3-4 days of right ear pain, pressure, sinus pressure, sore throat. Denies fever, trouble swallowing. Used some vicks on a cotton swab in R ear to help relieve pain. Denies other otc meds, sick contacts.  Medications: Outpatient Medications Prior to Visit  Medication Sig   acetic acid -hydrocortisone  (VOSOL -HC) OTIC solution Place 5 drops into both ears 2 (two) times daily.   Cinnamon 500 MG capsule Take 500 mg by mouth daily.   co-enzyme Q-10 30 MG capsule Take 30 mg by mouth 3 (three) times daily.   Continuous Glucose Sensor (DEXCOM G7 SENSOR) MISC 3 each by Does not apply route every 30 (thirty) days. Apply 1 sensor every 10 days   diclofenac  sodium (VOLTAREN ) 1 % GEL APPLY 4 G TOPICALLY 4 (FOUR) TIMES DAILY.   fenofibrate  160 MG tablet TAKE 1 TABLET (160 MG TOTAL) BY MOUTH DAILY. PT NEEDS OFFICE VISIT FOR FURTHER REFILLS.   fluticasone  (FLONASE ) 50 MCG/ACT nasal spray Place 2 sprays into both nostrils daily.   insulin  NPH Human (HUMULIN N) 100 UNIT/ML injection INJECT 80 UNITS EACH MORNING, AND 60 UNITS EACH EVENING.   Insulin  Syringe-Needle U-100 (BD INSULIN  SYRINGE U/F) 31G X 5/16 1 ML MISC 1 EACH BY OTHER ROUTE 2 (TWO) TIMES DAILY. AS DIRECTED   JARDIANCE  10 MG TABS tablet TAKE 1 TABLET BY MOUTH DAILY BEFORE BREAKFAST.   levothyroxine  (SYNTHROID ) 112 MCG tablet Take 1 tablet (112 mcg total) by mouth daily before breakfast.   loperamide  (IMODIUM ) 2 MG capsule Take 1 capsule (2 mg total) by mouth 4 (four) times daily as needed for diarrhea or loose stools.   loratadine  (CLARITIN ) 10 MG tablet TAKE 1 TABLET BY MOUTH EVERY DAY   losartan  (COZAAR ) 100 MG tablet Take 1 tablet (100 mg total) by mouth daily.   Omega 3-6-9 Fatty Acids (OMEGA 3-6-9  COMPLEX PO) Take by mouth.   tamsulosin (FLOMAX) 0.4 MG CAPS capsule Take 0.4 mg by mouth daily.   Turmeric 1053 MG TABS Take by mouth.   No facility-administered medications prior to visit.    Review of Systems  Constitutional:  Negative for fatigue and fever.  HENT:  Positive for congestion, ear pain and sore throat.   Respiratory:  Negative for cough and shortness of breath.   Cardiovascular:  Negative for chest pain, palpitations and leg swelling.  Neurological:  Negative for dizziness and headaches.       Objective    BP 128/70   Pulse 88   Temp 98.1 F (36.7 C)   Ht 6' 2 (1.88 m)   Wt 287 lb 9.6 oz (130.5 kg)   SpO2 96%   BMI 36.93 kg/m    Physical Exam Constitutional:      General: He is awake.     Appearance: He is well-developed.  HENT:     Head: Normocephalic.     Left Ear: Tympanic membrane normal.     Ears:     Comments: Right TM with clear serous fluid/bubbles mild bulging. Canal normal Eyes:     Conjunctiva/sclera: Conjunctivae normal.  Cardiovascular:     Rate and Rhythm: Normal rate and regular rhythm.     Heart sounds: Normal heart sounds.  Pulmonary:     Effort:  Pulmonary effort is normal.     Breath sounds: Normal breath sounds.  Skin:    General: Skin is warm.  Neurological:     Mental Status: He is alert and oriented to person, place, and time.  Psychiatric:        Attention and Perception: Attention normal.        Mood and Affect: Mood normal.        Speech: Speech normal.        Behavior: Behavior is cooperative.      No results found for any visits on 10/25/23.  Assessment & Plan    Acute streptococcal pharyngitis -     Amoxicillin ; Take 1 capsule (500 mg total) by mouth 2 (two) times daily.  Dispense: 20 capsule; Refill: 0 -     Culture, Group A Strep  Non-recurrent acute serous otitis media of right ear -     Azelastine  HCl; Place 1 spray into both nostrils 2 (two) times daily. Use in each nostril as directed  Dispense: 30  mL; Refill: 0  Right ear pain -     POC COVID-19 BinaxNow  Sore throat -     POCT rapid strep A -     Culture, Group A Strep   Poc covid negative, poc strep positive. Ordering culture Rx amox bid x 10 days. Rx azelastine . Tylenol  otc, hydrate.  Return if symptoms worsen or fail to improve.       Manuelita Flatness, PA-C  [facility] [phone] (phone) [phone] (fax)  [facility]

## 2023-10-27 LAB — CULTURE, GROUP A STREP
Micro Number: 16682242
SPECIMEN QUALITY:: ADEQUATE

## 2023-10-29 ENCOUNTER — Encounter: Payer: Self-pay | Admitting: Internal Medicine

## 2023-10-29 ENCOUNTER — Ambulatory Visit: Payer: Self-pay | Admitting: Physician Assistant

## 2023-10-29 ENCOUNTER — Ambulatory Visit: Payer: Medicare Other | Admitting: Internal Medicine

## 2023-10-29 VITALS — BP 124/80 | HR 81 | Ht 74.0 in | Wt 285.6 lb

## 2023-10-29 DIAGNOSIS — E890 Postprocedural hypothyroidism: Secondary | ICD-10-CM

## 2023-10-29 DIAGNOSIS — Z794 Long term (current) use of insulin: Secondary | ICD-10-CM | POA: Diagnosis not present

## 2023-10-29 DIAGNOSIS — E119 Type 2 diabetes mellitus without complications: Secondary | ICD-10-CM | POA: Diagnosis not present

## 2023-10-29 DIAGNOSIS — E041 Nontoxic single thyroid nodule: Secondary | ICD-10-CM | POA: Diagnosis not present

## 2023-10-29 LAB — POCT GLYCOSYLATED HEMOGLOBIN (HGB A1C): Hemoglobin A1C: 5.6 % (ref 4.0–5.6)

## 2023-10-29 MED ORDER — INSULIN NPH (HUMAN) (ISOPHANE) 100 UNIT/ML ~~LOC~~ SUSP: SUBCUTANEOUS | Status: DC

## 2023-10-29 NOTE — Addendum Note (Signed)
 Addended by: CLEOTILDE ROLIN RAMAN on: 10/29/2023 04:38 PM   Modules accepted: Orders

## 2023-10-29 NOTE — Patient Instructions (Addendum)
 Please decrease: - NPH 70 units in a.m. and 15 units at night  Continue: - Jardiance  10 mg before b'fast  Please continue Levothyroxine  112 mcg daily.  Take the thyroid  hormone every day, with water, at least 30 minutes before breakfast, separated by at least 4 hours from: - acid reflux medications - calcium  - iron - multivitamins  Please return for another visit in 4-6 months.

## 2023-10-29 NOTE — Progress Notes (Signed)
 Patient ID: Alex Rocha, male   DOB: 1955/09/23, 68 y.o.   MRN: [id number]  HPI: Alex Rocha is a 68 y.o.-year-old male, returning for follow-up for DM2, dx in 2000, insulin -dependent since 2014, controlled, with complications (cerebellar CVA, CKD stage III, peripheral neuropathy) and also thyroid  nodule and post ablative hypothyroidism. previously saw Dr. Kassie, last visit with me 6 months ago.  Interim history: No increased urination, blurry vision, nausea, chest pain. He had acute strep pharyngitis few days ago. This is better. He continues to have memory loss and also fatigue after his CVA (but improved). He is exercising more - both cardio and strength.  DM2: Reviewed HbA1c: Lab Results  Component Value Date   HGBA1C 6.1 (A) 05/01/2023   HGBA1C 6.3 (A) 12/29/2022   HGBA1C 6.9 (A) 06/22/2022   HGBA1C 6.8 (H) 03/31/2022   HGBA1C 6.4 10/25/2021   HGBA1C 6.0 (A) 08/04/2021   HGBA1C 5.9 (A) 03/31/2021   HGBA1C 5.7 (A) 11/29/2020   HGBA1C 7.1 (H) 09/14/2020   HGBA1C 6.9 (A) 07/09/2020   Pt is on a regimen of: - NPH 80 units in a.m. and 60 >> 0-40 >> 20-30 >> 30 units at night  - Jardiance  10 mg before breakfast - started 06/2022 Also on cinnamon 500 mg daily. He tried Metformin  in the past - tolerated well, from what he can remember  Pt checks his sugars >4x a day a day and they are:  Prev.:  Prev.:  Lowest sugar was 51 - seldom >> 67 >> 42; he has hypoglycemia awareness at 70.  Highest sugar was 250 >> 229 >> 200.  Glucometer: none  Pt's meals are: - Breakfast:  fruit - Lunch: salad , sandwich - Dinner: meat (chicken or grilled pork or beef) + starch (low carb pasta) + veggies; veggie burgers; may eat out - Snacks: no sweet drinks; crackers or cookies  - + stage 3 CKD - sees nephrology, last BUN/creatinine:  Lab Results  Component Value Date   BUN 27 (H) 07/05/2023   BUN 44 (H) 11/24/2022   CREATININE 1.60 (H) 07/05/2023   CREATININE 1.56 (H)  11/24/2022   Lab Results  Component Value Date   MICRALBCREAT 9 10/19/2021   MICRALBCREAT 4 09/05/2019   MICRALBCREAT 2 06/18/2015  On Cozaar  100 mg daily.  -+ HL; last set of lipids: Lab Results  Component Value Date   CHOL 172 07/05/2023   HDL 46.80 07/05/2023   LDLCALC 99 07/05/2023   LDLDIRECT 107.0 06/10/2020   TRIG 132.0 07/05/2023   CHOLHDL 4 07/05/2023  On fenofibrate  160 mg daily, omega-3 fatty acids.  - last eye exam was in 12/25/2022. No DR reportedly. He had cataract Sx - summer 2023.  - + numbness and tingling in his feet. Last foot exam 05/01/2023.  Thyroid  nodule:  Thyroid  U/S (01/01/2013): Right thyroid  lobe: 6.6 x 2.9 x 2.9 cm several rounded nodules are present  within the right lobe. The largest is a 2.3 x 1.6 cm solid nodule in  the mid right thyroid  gland.   Left thyroid  lobe: 5.7 x 2.4 x 2.4 cm.  No nodules visualized.   Isthmus Thickness: 0.4 cm   No nodules visualized.   Lymphadenopathy: None visualized.   IMPRESSION:  1. Multiple nodules within the right lobe of thyroid  gland. The  largest nodule measures 2.3 cm and is solid. Findings meet consensus  criteria for biopsy.   Thyroid  uptake and scan (01/21/2013): Normal 24 hr radio iodine uptake of 17.3%.  Dominant  right lobe nodule measuring 2.3 cm diameter on ultrasound is cold on scintigraphy.  Malignancy not excluded.   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  Solitary 15 x 17 x 16 mm hypoechoic solid nodule in the lower pole of the right gland. This has decreased in size compared to 23 x 17 x 16 mm previously. The background thyroid  parenchyma is mildly heterogeneous.   Left thyroid  lobe: 3.8 x 1.3 x 1.5 cm. Small and relatively heterogeneous thyroid  gland. No discrete nodule.   Isthmus Thickness: 0.5 cm.  No nodules visualized.   Lymphadenopathy: None visualized.   IMPRESSION: 1. Interval  involution of the previously noted right lower pole thyroid  nodule which presently measures a maximum of 17 mm compared to 23 mm on 01/01/2013. Decrease in size over time is highly consistent with a benign process. 2. No additional thyroid  nodules identified. 3. The background thyroid  gland is mildly heterogeneous.  Pt denies: - feeling nodules in neck - hoarseness - dysphagia - choking  Post ablative hypothyroidism  Pt is on levothyroxine  112 mcg daily, taken: - in am - fasting - at least 30 min from b'fast - no calcium  - no iron - no multivitamins - no PPIs - not on Biotin On B12.  Reviewed his TSH levels: Lab Results  Component Value Date   TSH 2.02 07/05/2023   TSH 2.30 11/24/2022   TSH 1.89 03/31/2022   TSH 1.59 10/25/2021   TSH 0.65 09/29/2021   TSH 1.82 12/10/2020   TSH 2.49 06/10/2020   TSH 2.42 03/09/2020   TSH 1.47 10/02/2019   TSH 0.76 09/11/2019   TSH 5.02 (H) 08/08/2019   TSH 3.62 05/29/2019   TSH 3.25 02/07/2019   TSH 1.58 12/04/2017   TSH 2.08 08/14/2016   TSH 2.50 06/18/2015   TSH 4.060 04/24/2014   TSH 2.70 03/09/2014   TSH 2.69 12/08/2013   TSH 5.04 (H) 08/25/2013   No FH of thyroid  cancer. No h/o radiation tx to head or neck. No herbal supplements. No Biotin use. No recent steroids use.   He also has a history of OSA, HTN, prostate cancer, kidney stones, low testosterone, cataract.  ROS: + see HPI +  Past Medical History:  Diagnosis Date   Anemia    Cataract    Colon polyps    Diabetes mellitus    Environmental allergies    Hyperlipidemia    Hypertension    Hyperthyroidism    Kidney stones    Low testosterone    Prostate cancer [facility])    Sleep apnea    Past Surgical History:  Procedure Laterality Date   CATARACT EXTRACTION Left 09/2021   CATARACT EXTRACTION Right 11/2021   PROSTATE BIOPSY     VASECTOMY     Social History   Socioeconomic History   Marital status: Married    Spouse name: Not on file   Number of  children: 2   Years of education: Not on file   Highest education level: Not on file  Occupational History   Occupation: Location manager  Tobacco Use   Smoking status: Never   Smokeless tobacco: Never  Vaping Use   Vaping status: Never Used  Substance and Sexual Activity   Alcohol use: Yes    Comment: rare - on social occasions   Drug use: No   Sexual activity: Yes  Other Topics Concern   Not on file  Social History Narrative  Exercise----not as much--- 1-2 x a week    Social Drivers of Corporate investment banker Strain: Not on file  Food Insecurity: Not on file  Transportation Needs: Not on file  Physical Activity: Not on file  Stress: Not on file  Social Connections: Not on file  Intimate Partner Violence: Not on file   Current Outpatient Medications on File Prior to Visit  Medication Sig Dispense Refill   acetic acid -hydrocortisone  (VOSOL -HC) OTIC solution Place 5 drops into both ears 2 (two) times daily. 10 mL 0   amoxicillin  (AMOXIL ) 500 MG capsule Take 1 capsule (500 mg total) by mouth 2 (two) times daily. 20 capsule 0   azelastine  (ASTELIN ) 0.1 % nasal spray Place 1 spray into both nostrils 2 (two) times daily. Use in each nostril as directed 30 mL 0   Cinnamon 500 MG capsule Take 500 mg by mouth daily.     co-enzyme Q-10 30 MG capsule Take 30 mg by mouth 3 (three) times daily.     Continuous Glucose Sensor (DEXCOM G7 SENSOR) MISC 3 each by Does not apply route every 30 (thirty) days. Apply 1 sensor every 10 days 9 each 4   diclofenac  sodium (VOLTAREN ) 1 % GEL APPLY 4 G TOPICALLY 4 (FOUR) TIMES DAILY. 100 g 1   fenofibrate  160 MG tablet TAKE 1 TABLET (160 MG TOTAL) BY MOUTH DAILY. PT NEEDS OFFICE VISIT FOR FURTHER REFILLS. 90 tablet 1   fluticasone  (FLONASE ) 50 MCG/ACT nasal spray Place 2 sprays into both nostrils daily. 16 g 6   insulin  NPH Human (HUMULIN N) 100 UNIT/ML injection INJECT 80 UNITS EACH MORNING, AND 60 UNITS EACH EVENING. 120 mL 3   Insulin   Syringe-Needle U-100 (BD INSULIN  SYRINGE U/F) 31G X 5/16 1 ML MISC 1 EACH BY OTHER ROUTE 2 (TWO) TIMES DAILY. AS DIRECTED 200 each 3   JARDIANCE  10 MG TABS tablet TAKE 1 TABLET BY MOUTH DAILY BEFORE BREAKFAST. 30 tablet 11   levothyroxine  (SYNTHROID ) 112 MCG tablet Take 1 tablet (112 mcg total) by mouth daily before breakfast. 90 tablet 1   loperamide  (IMODIUM ) 2 MG capsule Take 1 capsule (2 mg total) by mouth 4 (four) times daily as needed for diarrhea or loose stools. 12 capsule 0   loratadine  (CLARITIN ) 10 MG tablet TAKE 1 TABLET BY MOUTH EVERY DAY 90 tablet 1   losartan  (COZAAR ) 100 MG tablet Take 1 tablet (100 mg total) by mouth daily. 90 tablet 1   Omega 3-6-9 Fatty Acids (OMEGA 3-6-9 COMPLEX PO) Take by mouth.     tamsulosin (FLOMAX) 0.4 MG CAPS capsule Take 0.4 mg by mouth daily.     Turmeric 1053 MG TABS Take by mouth.     No current facility-administered medications on file prior to visit.   Allergies  Allergen Reactions   [name] [Yohimbine] Palpitations   Family History  Problem Relation Age of Onset   Colon cancer Mother    Heart disease Mother    Hypertension Mother        Mother's side of the family   Diabetes Mother        Mother's side of the family   Hyperlipidemia Sister    Hypertension Sister    Diabetes Sister    Heart disease Sister    Osteoarthritis Sister    Diabetes Sister    Colon cancer Brother    Cancer Paternal Aunt        unknown type   Rectal cancer Other    Stomach  cancer Other    Heart disease Other    Diabetes Other        father's family   Breast cancer Neg Hx    Pancreatic cancer Neg Hx    PE: BP 124/80   Pulse 81   Ht 6' 2 (1.88 m)   Wt 285 lb 9.6 oz (129.5 kg)   SpO2 97%   BMI 36.67 kg/m  Wt Readings from Last 10 Encounters:  10/29/23 285 lb 9.6 oz (129.5 kg)  10/25/23 287 lb 9.6 oz (130.5 kg)  07/10/23 291 lb (132 kg)  07/05/23 293 lb 12.8 oz (133.3 kg)  05/01/23 292 lb 12.8 oz (132.8 kg)  12/29/22 298 lb 12.8 oz (135.5  kg)  11/24/22 297 lb 9.6 oz (135 kg)  06/22/22 (!) 302 lb 9.6 oz (137.3 kg)  05/19/22 (!) 301 lb (136.5 kg)  03/31/22 291 lb (132 kg)   Constitutional: overweight, in NAD Eyes:  EOMI, + B exophthalmos ENT: no neck masses, no cervical lymphadenopathy Cardiovascular: RRR, No MRG Respiratory: CTA B Musculoskeletal: no deformities Skin:no rashes Neurological: no tremor with outstretched hands  ASSESSMENT: 1. DM2, insulin -dependent, controlled, with complications - CVA -cerebellum -seen on MRI 04/26/2022 - CKD stage 3 - PN  2. Right thyroid  nodule  3. Post ablative hypothyroidism  PLAN:  1. Patient with longstanding, uncontrolled, type 2 diabetes, on intermediate acting insulin  and SGLT2 inhibitor, with improving control.  At last visit, HbA1c was lower, at goal, at 6.1%.  Sugars also appears to be well-controlled, fluctuating within the target range, and without lows.  He had occasional hyperglycemic peaks but these were quite mild and infrequent.  We did not change his regimen.   CGM interpretation: -At today's visit, we reviewed his CGM downloads: It appears that 96% of values are in target range (goal >70%), while 2% are higher than 180 (goal <25%), and 2% are lower than 70 (goal <4%).  The calculated average blood sugar is 122.  The projected HbA1c for the next 3 months (GMI) is 6.2%. -Reviewing the CGM trends, sugars appear fluctuating within the target range, with occasional low blood sugars between 4 and 7 AM, but with the trend of dropping his blood sugars overnight.  I advised him to reduce his NPH dose at night by 50%.  Also, since he occasionally has lows during the day, advised him to reduce his NPH dose in the morning, also.  At next visit, I am hoping that we can stop the NPH at night.  Will continue the same dose of Jardiance  for now. -He started to exercise since last visit and I believe that this may be the reason why his sugars and HbA1c improved.  I advised him to continue  with this as this improves his insulin  sensitivity and allows us  to use less insulin .  He also lost 7 pounds since last visit. - I suggested to:  Patient Instructions  Please change: - NPH 80 units in a.m. and 30 units at night - Jardiance  10 mg before b'fast  Please continue Levothyroxine  112 mcg daily.  Take the thyroid  hormone every day, with water, at least 30 minutes before breakfast, separated by at least 4 hours from: - acid reflux medications - calcium  - iron - multivitamins  Please return for another visit in 4-6 months.   - we checked his HbA1c: 5.6% (lower) - advised to check sugars at different times of the day - 4x a day, rotating check times - advised for yearly eye exams >>  he is UTD - return to clinic in 4-6 months  2. Right thyroid  nodule -With history of benign biopsy -see HPI -He had RAI treatment and the size of the nodule and the rest of the thyroid  decreased afterwards. -Follow-up ultrasound from 2016 showed a much smaller R nodule, with no need for follow-up - No neck compression symptoms or masses felt on palpation of his neck today  3. Post ablative hypothyroidism - latest thyroid  labs reviewed with pt. >> normal: Lab Results  Component Value Date   TSH 2.02 07/05/2023  - he continues on LT4 112 mcg daily - pt feels good on this dose. - we discussed about taking the thyroid  hormone every day, with water, >30 minutes before breakfast, separated by >4 hours from acid reflux medications, calcium , iron, multivitamins. Pt. is taking it correctly.  Lela Fendt, MD PhD [facility] Endocrin[facility]

## 2023-11-01 ENCOUNTER — Telehealth: Payer: Self-pay | Admitting: Family Medicine

## 2023-11-01 DIAGNOSIS — E559 Vitamin D deficiency, unspecified: Secondary | ICD-10-CM | POA: Diagnosis not present

## 2023-11-01 DIAGNOSIS — N2581 Secondary hyperparathyroidism of renal origin: Secondary | ICD-10-CM | POA: Diagnosis not present

## 2023-11-01 DIAGNOSIS — N1832 Chronic kidney disease, stage 3b: Secondary | ICD-10-CM | POA: Diagnosis not present

## 2023-11-01 DIAGNOSIS — E785 Hyperlipidemia, unspecified: Secondary | ICD-10-CM | POA: Diagnosis not present

## 2023-11-01 DIAGNOSIS — E1122 Type 2 diabetes mellitus with diabetic chronic kidney disease: Secondary | ICD-10-CM | POA: Diagnosis not present

## 2023-11-01 DIAGNOSIS — I129 Hypertensive chronic kidney disease with stage 1 through stage 4 chronic kidney disease, or unspecified chronic kidney disease: Secondary | ICD-10-CM | POA: Diagnosis not present

## 2023-11-01 NOTE — Telephone Encounter (Signed)
 Copied from CRM [id number]. Topic: Medicare AWV >> Nov 01, 2023 11:23 AM Nathanel DEL wrote: Reason for CRM: Called 11/01/2023 to sched AWV - NO VOICEMAIL  Nathanel Paschal; Care Guide Ambulatory Clinical Support [place] [facility] Direct Dial: [phone]

## 2023-11-16 ENCOUNTER — Other Ambulatory Visit: Payer: Self-pay | Admitting: Physician Assistant

## 2023-11-16 DIAGNOSIS — H6501 Acute serous otitis media, right ear: Secondary | ICD-10-CM

## 2023-11-20 ENCOUNTER — Other Ambulatory Visit: Payer: Self-pay | Admitting: Physician Assistant

## 2023-11-20 DIAGNOSIS — H6501 Acute serous otitis media, right ear: Secondary | ICD-10-CM

## 2023-11-21 ENCOUNTER — Other Ambulatory Visit: Payer: Self-pay | Admitting: *Deleted

## 2023-12-17 ENCOUNTER — Encounter: Payer: Self-pay | Admitting: Internal Medicine

## 2023-12-18 ENCOUNTER — Telehealth: Payer: Self-pay

## 2023-12-18 MED ORDER — DEXCOM G7 RECEIVER DEVI: 0 refills | Status: DC

## 2023-12-18 MED ORDER — DEXCOM G7 SENSOR MISC: 0 refills | Status: DC

## 2023-12-18 NOTE — Telephone Encounter (Signed)
 Sample  Device/Supplies: Dexcom G7 Quantity:2 ONU:[id number] EXP:02/14/2025   Rolin GORMAN [place]inal 8:48 AM 12/18/2023

## 2023-12-26 ENCOUNTER — Ambulatory Visit (INDEPENDENT_AMBULATORY_CARE_PROVIDER_SITE_OTHER): Admitting: *Deleted

## 2023-12-26 ENCOUNTER — Telehealth (INDEPENDENT_AMBULATORY_CARE_PROVIDER_SITE_OTHER): Payer: Self-pay | Admitting: *Deleted

## 2023-12-26 VITALS — BP 108/60 | HR 72 | Temp 98.4°F | Resp 16 | Ht 74.0 in | Wt 287.6 lb

## 2023-12-26 DIAGNOSIS — Z0000 Encounter for general adult medical examination without abnormal findings: Secondary | ICD-10-CM

## 2023-12-26 DIAGNOSIS — Z23 Encounter for immunization: Secondary | ICD-10-CM

## 2023-12-26 NOTE — Telephone Encounter (Signed)
 FYI:  Patient had AWV today. Has developed left knee pain and wants to know if referral is needed for Orthopedic. Pt is already established with Dr Joane and will contact his office first.  Advised pt to let us  if referral is needed.

## 2023-12-26 NOTE — Patient Instructions (Addendum)
 [name] , Thank you for taking time out of your busy schedule to complete your Annual Wellness Visit with me. I enjoyed our conversation and look forward to speaking with you again next year. I, as well as your care team,  appreciate your ongoing commitment to your health goals. Please review the following plan we discussed and let me know if I can assist you in the future. Your Game plan/ To Do List    Referrals: If you haven't heard from the office you've been referred to, please reach out to them at the phone provided.   Follow up Visits: Next Medicare AWV with our clinical staff:  12/30/24 3pm.   Next Office Visit with your provider: 01/14/24 8:40 am  Clinician Recommendations:  Aim for 30 minutes of exercise or brisk walking, 6-8 glasses of water, and 5 servings of fruits and vegetables each day.       This is a list of the screening recommended for you and due dates:  Health Maintenance  Topic Date Due   Medicare Annual Wellness Visit  Never done   Eye exam for diabetics  03/29/2021   Flu Shot  11/16/2023   COVID-19 Vaccine (6 - Moderna risk 2024-25 season) 12/17/2023   Yearly kidney health urinalysis for diabetes  04/30/2028*   Complete foot exam   04/30/2024   Hemoglobin A1C  04/30/2024   Yearly kidney function blood test for diabetes  07/04/2024   DTaP/Tdap/Td vaccine (2 - Td or Tdap) 01/10/2025   Colon Cancer Screening  02/01/2026   Pneumococcal Vaccine for age over 36  Completed   Hepatitis C Screening  Completed   Zoster (Shingles) Vaccine  Completed   Hepatitis B Vaccine  Aged Out   HPV Vaccine  Aged Out   Meningitis B Vaccine  Aged Out   HIV Screening  Discontinued  *Topic was postponed. The date shown is not the original due date.    Advanced directives: (Provided) Advance directive discussed with you today. I have provided a copy for you to complete at home and have notarized. Once this is complete, please bring a copy in to our office so we can scan it into your  chart.  Advance Care Planning is important because it:  [x]  Makes sure you receive the medical care that is consistent with your values, goals, and preferences  [x]  It provides guidance to your family and loved ones and reduces their decisional burden about whether or not they are making the right decisions based on your wishes.  Follow the link provided in your after visit summary or read over the paperwork we have mailed to you to help you started getting your Advance Directives in place. If you need assistance in completing these, please reach out to us  so that we can help you!  See attachments for Preventive Care and Fall Prevention Tips.

## 2023-12-26 NOTE — Progress Notes (Signed)
 Please attest this visit in the absence of patient primary care provider.    Subjective:   Alex Rocha is a 68 y.o. who presents for a Medicare Wellness preventive visit.  As a reminder, Annual Wellness Visits don't include a physical exam, and some assessments may be limited, especially if this visit is performed virtually. We may recommend an in-person follow-up visit with your provider if needed.  Visit Complete: In person  Persons Participating in Visit: Patient.  AWV Questionnaire: No: Patient Medicare AWV questionnaire was not completed prior to this visit.  Cardiac Risk Factors include: advanced age (>15men, >44 women);hypertension;male gender;diabetes mellitus;dyslipidemia;Other (see comment), Risk factor comments: Hx prostate cancer, CKD     Objective:    Today's Vitals   12/26/23 1445  BP: 108/60  Pulse: 72  Resp: 16  Temp: 98.4 F (36.9 C)  TempSrc: Oral  SpO2: 96%  Weight: 287 lb 9.6 oz (130.5 kg)  Height: 6' 2 (1.88 m)   Body mass index is 36.93 kg/m.     12/26/2023    3:10 PM 07/29/2020    3:04 PM 05/11/2020    8:15 AM 08/01/2019    5:07 PM 05/07/2016    8:16 AM 12/29/2014   12:11 PM 01/18/2014    7:15 PM  Advanced Directives  Does Patient Have a Medical Advance Directive? No No No No No  No  No   Would patient like information on creating a medical advance directive? Yes (MAU/Ambulatory/Procedural Areas - Information given) No - Patient declined No - Patient declined  No - Patient declined  No - patient declined information  No - patient declined information      Data saved with a previous flowsheet row definition    Current Medications (verified) Outpatient Encounter Medications as of 12/26/2023  Medication Sig   acetic acid -hydrocortisone  (VOSOL -HC) OTIC solution Place 5 drops into both ears 2 (two) times daily.   Azelastine  HCl 137 MCG/SPRAY SOLN PLACE 1 SPRAY INTO BOTH NOSTRILS 2 (TWO) TIMES DAILY AS DIRECTED   Cinnamon 500 MG capsule Take  500 mg by mouth daily.   co-enzyme Q-10 30 MG capsule Take 30 mg by mouth 3 (three) times daily.   Continuous Glucose Receiver (DEXCOM G7 RECEIVER) DEVI Use to monitor glucose continuously   Continuous Glucose Sensor (DEXCOM G7 SENSOR) MISC 3 each by Does not apply route every 30 (thirty) days. Apply 1 sensor every 10 days   Continuous Glucose Sensor (DEXCOM G7 SENSOR) MISC Use to check glucose continuously, change sensor every 10 days.ONU:[id number] EXP:02/14/2025   diclofenac  sodium (VOLTAREN ) 1 % GEL APPLY 4 G TOPICALLY 4 (FOUR) TIMES DAILY.   fenofibrate  160 MG tablet TAKE 1 TABLET (160 MG TOTAL) BY MOUTH DAILY. PT NEEDS OFFICE VISIT FOR FURTHER REFILLS.   fluticasone  (FLONASE ) 50 MCG/ACT nasal spray Place 2 sprays into both nostrils daily.   insulin  NPH Human (HUMULIN N) 100 UNIT/ML injection INJECT 70 UNITS EACH MORNING AND 15 UNITS EACH EVENING.   Insulin  Syringe-Needle U-100 (BD INSULIN  SYRINGE U/F) 31G X 5/16 1 ML MISC 1 EACH BY OTHER ROUTE 2 (TWO) TIMES DAILY. AS DIRECTED   JARDIANCE  10 MG TABS tablet TAKE 1 TABLET BY MOUTH DAILY BEFORE BREAKFAST.   levothyroxine  (SYNTHROID ) 112 MCG tablet Take 1 tablet (112 mcg total) by mouth daily before breakfast.   loratadine  (CLARITIN ) 10 MG tablet TAKE 1 TABLET BY MOUTH EVERY DAY   losartan  (COZAAR ) 100 MG tablet Take 1 tablet (100 mg total) by mouth daily.  Omega 3-6-9 Fatty Acids (OMEGA 3-6-9 COMPLEX PO) Take by mouth.   tamsulosin (FLOMAX) 0.4 MG CAPS capsule Take 0.4 mg by mouth daily.   Turmeric 1053 MG TABS Take by mouth.   [DISCONTINUED] loperamide  (IMODIUM ) 2 MG capsule Take 1 capsule (2 mg total) by mouth 4 (four) times daily as needed for diarrhea or loose stools. (Patient not taking: Reported on 12/26/2023)   No facility-administered encounter medications on file as of 12/26/2023.    Allergies (verified) [name] [yohimbine]   History: Past Medical History:  Diagnosis Date   Anemia    Cataract    Colon polyps    Diabetes  mellitus    Environmental allergies    Hyperlipidemia    Hypertension    Hyperthyroidism    Kidney stones    Low testosterone    Prostate cancer (HCC)    Sleep apnea    Past Surgical History:  Procedure Laterality Date   CATARACT EXTRACTION Left 09/2021   CATARACT EXTRACTION Right 11/2021   PROSTATE BIOPSY     VASECTOMY     Family History  Problem Relation Age of Onset   Colon cancer Mother    Heart disease Mother    Hypertension Mother        Mother's side of the family   Diabetes Mother        Mother's side of the family   Hyperlipidemia Sister    Hypertension Sister    Diabetes Sister    Heart disease Sister    Osteoarthritis Sister    Diabetes Sister    Colon cancer Brother    Cancer Paternal Aunt        unknown type   Rectal cancer Other    Stomach cancer Other    Heart disease Other    Diabetes Other        father's family   Breast cancer Neg Hx    Pancreatic cancer Neg Hx    Social History   Socioeconomic History   Marital status: Married    Spouse name: Not on file   Number of children: 2   Years of education: Not on file   Highest education level: Not on file  Occupational History   Occupation: Location manager  Tobacco Use   Smoking status: Never   Smokeless tobacco: Never  Vaping Use   Vaping status: Never Used  Substance and Sexual Activity   Alcohol use: Yes    Comment: rare - on social occasions   Drug use: No   Sexual activity: Yes  Other Topics Concern   Not on file  Social History Narrative   Exercise----not as much--- 1-2 x a week    Social Drivers of Corporate investment banker Strain: Low Risk  (12/26/2023)   Overall Financial Resource Strain (CARDIA)    Difficulty of Paying Living Expenses: Not very hard  Food Insecurity: No Food Insecurity (12/26/2023)   Hunger Vital Sign    Worried About Running Out of Food in the Last Year: Never true    Ran Out of Food in the Last Year: Never true  Transportation Needs: No  Transportation Needs (12/26/2023)   PRAPARE - Administrator, Civil Service (Medical): No    Lack of Transportation (Non-Medical): No  Physical Activity: Insufficiently Active (12/26/2023)   Exercise Vital Sign    Days of Exercise per Week: 4 days    Minutes of Exercise per Session: 30 min  Stress: No Stress Concern Present (12/26/2023)  [employer] of Occupational Health - Occupational Stress Questionnaire    Feeling of Stress: Not at all  Social Connections: Socially Integrated (12/26/2023)   Social Connection and Isolation Panel    Frequency of Communication with Friends and Family: More than three times a week    Frequency of Social Gatherings with Friends and Family: More than three times a week    Attends Religious Services: More than 4 times per year    Active Member of [employer] or Organizations: No    Attends Engineer, structural: More than 4 times per year    Marital Status: Married    Tobacco Counseling Counseling given: Not Answered    Clinical Intake:  Pre-visit preparation completed: Yes  Pain : No/denies pain (not while sitting)     BMI - recorded: 36.93 Nutritional Status: BMI > 30  Obese Nutritional Risks: None Diabetes: Yes CBG done?: No Did pt. bring in CBG monitor from home?: No  Lab Results  Component Value Date   HGBA1C 5.6 10/29/2023   HGBA1C 6.1 (A) 05/01/2023   HGBA1C 6.3 (A) 12/29/2022     How often do you need to have someone help you when you read instructions, pamphlets, or other written materials from your doctor or pharmacy?: 1 - Never  Interpreter Needed?: No  Information entered by :: Lolita Libra, CMA(AAMA)   Activities of Daily Living     12/26/2023    2:57 PM  In your present state of health, do you have any difficulty performing the following activities:  Hearing? 0  Vision? 0  Difficulty concentrating or making decisions? 0  Walking or climbing stairs? 0  Dressing or bathing? 0  Doing errands,  shopping? 0  Preparing Food and eating ? N  Using the Toilet? N  In the past six months, have you accidently leaked urine? N  Do you have problems with loss of bowel control? N  Managing your Medications? N  Managing your Finances? N  Housekeeping or managing your Housekeeping? N    Patient Care Team: Antonio Meth, Jamee SAUNDERS, DO as PCP - General (Family Medicine) Nieves Cough, MD as Consulting Physician (Urology) Renda Glance, MD as Consulting Physician (Urology) Gearline Norris, MD as Consulting Physician (Nephrology) Roney, Jenna, OD as Referring Physician (Optometry)  I have updated your Care Teams any recent Medical Services you may have received from other providers in the past year.     Assessment:   This is a routine wellness examination for Laurance.  Hearing/Vision screen Hearing Screening - Comments:: Denies hearing difficulties.  Vision Screening - Comments:: Up to date at [facility], 12/2022. Will schedule for this year soon.   Goals Addressed   None    Depression Screen     12/26/2023    3:04 PM 07/05/2023    9:08 AM 03/31/2022    8:44 AM 07/29/2020    3:03 PM 08/18/2019    3:32 PM  PHQ 2/9 Scores  PHQ - 2 Score 0 0 0 0 0  PHQ- 9 Score 3        Fall Risk     12/26/2023    3:08 PM 07/05/2023    9:08 AM 03/31/2022    9:09 AM 03/31/2022    8:43 AM 07/29/2020    3:03 PM  Fall Risk   Falls in the past year? 0 0 1 0 0  Number falls in past yr: 0 0 0 0   Injury with Fall? 0 0 0 0   Risk  for fall due to : Impaired balance/gait      Follow up Education provided Falls evaluation completed Falls evaluation completed  Falls evaluation completed       Data saved with a previous flowsheet row definition    MEDICARE RISK AT HOME:  Medicare Risk at Home Any stairs in or around the home?: Yes If so, are there any without handrails?: No Home free of loose throw rugs in walkways, pet beds, electrical cords, etc?: Yes Adequate lighting in your home to reduce risk  of falls?: Yes Life alert?: No Use of a cane, walker or w/c?: No Grab bars in the bathroom?: Yes Shower chair or bench in shower?: Yes Elevated toilet seat or a handicapped toilet?: No  TIMED UP AND GO:  Was the test performed?  Yes  Length of time to ambulate 10 feet: 7 sec Gait slow and steady without use of assistive device  Cognitive Function: 6CIT completed        12/26/2023    3:10 PM  6CIT Screen  What Year? 0 points  What month? 0 points  What time? 0 points  Count back from 20 0 points  Months in reverse 0 points  Repeat phrase 0 points  Total Score 0 points    Immunizations Immunization History  Administered Date(s) Administered   Influenza, Mdck, Trivalent,PF 6+ MOS(egg free) 12/13/2022   Influenza,inj,Quad PF,6+ Mos 04/28/2013, 01/26/2014, 12/11/2014, 02/04/2016, 01/07/2018, 12/31/2018   Influenza-Unspecified 02/12/2020, 01/25/2022   Moderna SARS-COV2 Booster Vaccination 09/03/2020   Moderna Sars-Covid-2 Vaccination 04/28/2019, 05/26/2019, 02/17/2020   PNEUMOCOCCAL CONJUGATE-20 12/10/2020   Pfizer Covid-19 Vaccine Bivalent Booster 85yrs & up 01/25/2022   Pfizer(Comirnaty)Fall Seasonal Vaccine 12 years and older 01/01/2023   Pneumococcal Polysaccharide-23 01/11/2015   Tdap 01/11/2015   Zoster Recombinant(Shingrix ) 12/04/2017, 02/05/2018    Screening Tests Health Maintenance  Topic Date Due   Medicare Annual Wellness (AWV)  Never done   OPHTHALMOLOGY EXAM  03/29/2021   Influenza Vaccine  11/16/2023   COVID-19 Vaccine (6 - Moderna risk 2024-25 season) 12/17/2023   Diabetic kidney evaluation - Urine ACR  04/30/2028 (Originally 10/20/2022)   FOOT EXAM  04/30/2024   HEMOGLOBIN A1C  04/30/2024   Diabetic kidney evaluation - eGFR measurement  07/04/2024   DTaP/Tdap/Td (2 - Td or Tdap) 01/10/2025   Colonoscopy  02/01/2026   Pneumococcal Vaccine: 50+ Years  Completed   Hepatitis C Screening  Completed   Zoster Vaccines- Shingrix   Completed   Hepatitis B  Vaccines 19-59 Average Risk  Aged Out   HPV VACCINES  Aged Out   Meningococcal B Vaccine  Aged Out   HIV Screening  Discontinued    Health Maintenance Items Addressed: Flu vaccine given today. Will provide urine MALB at next OV. Most recent eye exam requested.  Additional Screening:  Vision Screening: Recommended annual ophthalmology exams for early detection of glaucoma and other disorders of the eye. Is the patient up to date with their annual eye exam?  Yes  Who is the provider or what is the name of the office in which the patient attends annual eye exams? [facility], Jenna Roney  Dental Screening: Recommended annual dental exams for proper oral hygiene  Community Resource Referral / Chronic Care Management: CRR required this visit?  No   CCM required this visit?  No   Plan:    I have personally reviewed and noted the following in the patient's chart:   Medical and social history Use of alcohol, tobacco or illicit drugs  Current  medications and supplements including opioid prescriptions. Patient is not currently taking opioid prescriptions. Functional ability and status Nutritional status Physical activity Advanced directives List of other physicians Hospitalizations, surgeries, and ER visits in previous 12 months Vitals Screenings to include cognitive, depression, and falls Referrals and appointments  In addition, I have reviewed and discussed with patient certain preventive protocols, quality metrics, and best practice recommendations. A written personalized care plan for preventive services as well as general preventive health recommendations were provided to patient.   Lolita Libra, CMA   12/26/2023   After Visit Summary: (MyChart) Due to this being a telephonic visit, the after visit summary with patients personalized plan was offered to patient via MyChart   Notes: see phone note

## 2023-12-28 ENCOUNTER — Other Ambulatory Visit: Payer: Self-pay | Admitting: Family Medicine

## 2024-01-02 NOTE — Progress Notes (Unsigned)
   LILLETTE Ileana Collet, PhD, LAT, ATC acting as a scribe for Artist Lloyd, MD.  Alex Rocha is a 68 y.o. male who presents to [facility] today for L knee pain. Pt was previously seen by Dr. Lloyd on 07/10/23 for R knee pain.  Today, pt reports injuring his L knee ***. Pt locates pain to ***  L Knee swelling: Mechanical symptoms: Aggravates: Treatments tried:  Pertinent review of systems: ***  Relevant historical information: ***   Exam:  There were no vitals taken for this visit. General: Well Developed, well nourished, and in no acute distress.   MSK: ***    Lab and Radiology Results No results found for this or any previous visit (from the past 72 hours). No results found.     Assessment and Plan: 68 y.o. male with ***   PDMP not reviewed this encounter. No orders of the defined types were placed in this encounter.  No orders of the defined types were placed in this encounter.    Discussed warning signs or symptoms. Please see discharge instructions. Patient expresses understanding.   ***

## 2024-01-03 ENCOUNTER — Other Ambulatory Visit: Payer: Self-pay

## 2024-01-03 ENCOUNTER — Ambulatory Visit: Admitting: Family Medicine

## 2024-01-03 ENCOUNTER — Ambulatory Visit (INDEPENDENT_AMBULATORY_CARE_PROVIDER_SITE_OTHER)

## 2024-01-03 ENCOUNTER — Encounter: Payer: Self-pay | Admitting: Family Medicine

## 2024-01-03 VITALS — BP 122/68 | HR 73 | Ht 74.0 in | Wt 293.0 lb

## 2024-01-03 DIAGNOSIS — G8929 Other chronic pain: Secondary | ICD-10-CM

## 2024-01-03 DIAGNOSIS — M25462 Effusion, left knee: Secondary | ICD-10-CM | POA: Diagnosis not present

## 2024-01-03 DIAGNOSIS — M1712 Unilateral primary osteoarthritis, left knee: Secondary | ICD-10-CM

## 2024-01-03 DIAGNOSIS — M25562 Pain in left knee: Secondary | ICD-10-CM

## 2024-01-03 NOTE — Patient Instructions (Addendum)
 Thank you for coming in today.   Please get an Xray today before you leave   You received an injection today. Seek immediate medical attention if the joint becomes red, extremely painful, or is oozing fluid.   See you back as needed.

## 2024-01-05 ENCOUNTER — Other Ambulatory Visit: Payer: Self-pay | Admitting: Family Medicine

## 2024-01-09 ENCOUNTER — Ambulatory Visit: Payer: Self-pay | Admitting: Family Medicine

## 2024-01-09 NOTE — Progress Notes (Signed)
 Left knee x-ray shows some arthritis in the knee joint.

## 2024-01-14 ENCOUNTER — Encounter: Payer: Self-pay | Admitting: Family Medicine

## 2024-01-14 ENCOUNTER — Ambulatory Visit: Admitting: Family Medicine

## 2024-01-14 VITALS — BP 120/60 | HR 62 | Temp 98.0°F | Resp 18 | Ht 74.0 in | Wt 295.0 lb

## 2024-01-14 DIAGNOSIS — E785 Hyperlipidemia, unspecified: Secondary | ICD-10-CM

## 2024-01-14 DIAGNOSIS — E1169 Type 2 diabetes mellitus with other specified complication: Secondary | ICD-10-CM | POA: Diagnosis not present

## 2024-01-14 DIAGNOSIS — N183 Chronic kidney disease, stage 3 unspecified: Secondary | ICD-10-CM | POA: Diagnosis not present

## 2024-01-14 DIAGNOSIS — N1832 Chronic kidney disease, stage 3b: Secondary | ICD-10-CM | POA: Diagnosis not present

## 2024-01-14 DIAGNOSIS — I10 Essential (primary) hypertension: Secondary | ICD-10-CM | POA: Diagnosis not present

## 2024-01-14 DIAGNOSIS — C61 Malignant neoplasm of prostate: Secondary | ICD-10-CM

## 2024-01-14 DIAGNOSIS — E1122 Type 2 diabetes mellitus with diabetic chronic kidney disease: Secondary | ICD-10-CM | POA: Diagnosis not present

## 2024-01-14 DIAGNOSIS — E890 Postprocedural hypothyroidism: Secondary | ICD-10-CM | POA: Diagnosis not present

## 2024-01-14 DIAGNOSIS — Z794 Long term (current) use of insulin: Secondary | ICD-10-CM

## 2024-01-14 DIAGNOSIS — E119 Type 2 diabetes mellitus without complications: Secondary | ICD-10-CM | POA: Diagnosis not present

## 2024-01-14 LAB — MICROALBUMIN / CREATININE URINE RATIO
Creatinine,U: 73.7 mg/dL
Microalb Creat Ratio: 10 mg/g (ref 0.0–30.0)
Microalb, Ur: 0.7 mg/dL (ref 0.0–1.9)

## 2024-01-14 NOTE — Assessment & Plan Note (Signed)
Check labs  On synthroid 

## 2024-01-14 NOTE — Assessment & Plan Note (Signed)
 Per endo

## 2024-01-14 NOTE — Addendum Note (Signed)
 Addended by: TRUDY CURVIN RAMAN on: 01/14/2024 02:04 PM   Modules accepted: Orders

## 2024-01-14 NOTE — Assessment & Plan Note (Signed)
 Check labs

## 2024-01-14 NOTE — Progress Notes (Signed)
 Subjective:    Patient ID: Alex Rocha, male    DOB: 10/13/1955, 68 y.o.   MRN: [id number]  Chief Complaint  Patient presents with   Diabetes   Hypothyroidism   Hypertension   Hyperlipidemia   Follow-up    HPI Patient is in today for f/u.  Discussed the use of AI scribe software for clinical note transcription with the patient, who gave verbal consent to proceed.  History of Present Illness Alex Rocha is a 68 year old male with diabetes who presents for a routine follow-up visit.  His diabetes management is stable with a recent A1c of 5.6. He uses a Dexcom device for continuous glucose monitoring and is satisfied with it. He is currently taking Jardiance  and insulin  for diabetes management.  He recently visited an ophthalmologist who attributed his eye issues to aging rather than diabetes. He had an eye exam on Wednesday, January 08, 2024, and is expecting new glasses soon.  He has a history of a torn ACL, lateral meniscus, and partial medial meniscus. 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 He received a shot for his knee and is managing with physical therapy.  He received his flu shot on December 26, 2023, and is uncertain about the exact date of his last COVID vaccine, though it may have been administered at a [facility] on [address]    Past Medical History:  Diagnosis Date   Anemia    Cataract    Colon polyps    Diabetes mellitus    Environmental allergies    Hyperlipidemia    Hypertension    Hyperthyroidism    Kidney stones    Low testosterone    Prostate cancer (HCC)    Sleep apnea     Past Surgical History:  Procedure Laterality Date   CATARACT EXTRACTION Left 09/2021   CATARACT EXTRACTION Right 11/2021   PROSTATE BIOPSY     VASECTOMY      Family History  Problem Relation Age of Onset   Colon cancer Mother    Heart disease Mother    Hypertension Mother         Mother's side of the family   Diabetes Mother        Mother's side of the family   Hyperlipidemia Sister    Hypertension Sister    Diabetes Sister    Heart disease Sister    Osteoarthritis Sister    Diabetes Sister    Colon cancer Brother    Cancer Paternal Aunt        unknown type   Rectal cancer Other    Stomach cancer Other    Heart disease Other    Diabetes Other        father's family   Breast cancer Neg Hx    Pancreatic cancer Neg Hx     Social History   Socioeconomic History   Marital status: Married    Spouse name: Not on file   Number of children: 2   Years of education: Not on file   Highest education level: Not on file  Occupational History   Occupation: Location manager  Tobacco Use   Smoking status: Never   Smokeless tobacco: Never  Vaping Use   Vaping status: Never Used  Substance and Sexual Activity   Alcohol use: Yes    Comment: rare - on social occasions   Drug use: No   Sexual activity:  Yes  Other Topics Concern   Not on file  Social History Narrative   Exercise----not as much--- 1-2 x a week    Social Drivers of Health   Financial Resource Strain: Low Risk  (12/26/2023)   Overall Financial Resource Strain (CARDIA)    Difficulty of Paying Living Expenses: Not very hard  Food Insecurity: No Food Insecurity (12/26/2023)   Hunger Vital Sign    Worried About Running Out of Food in the Last Year: Never true    Ran Out of Food in the Last Year: Never true  Transportation Needs: No Transportation Needs (12/26/2023)   PRAPARE - Administrator, Civil Service (Medical): No    Lack of Transportation (Non-Medical): No  Physical Activity: Insufficiently Active (12/26/2023)   Exercise Vital Sign    Days of Exercise per Week: 4 days    Minutes of Exercise per Session: 30 min  Stress: No Stress Concern Present (12/26/2023)   [employer] of Occupational Health - Occupational Stress Questionnaire    Feeling of Stress: Not at all   Social Connections: Socially Integrated (12/26/2023)   Social Connection and Isolation Panel    Frequency of Communication with Friends and Family: More than three times a week    Frequency of Social Gatherings with Friends and Family: More than three times a week    Attends Religious Services: More than 4 times per year    Active Member of [employer] or Organizations: No    Attends Engineer, structural: More than 4 times per year    Marital Status: Married  Catering manager Violence: Not At Risk (12/26/2023)   Humiliation, Afraid, Rape, and Kick questionnaire    Fear of Current or Ex-Partner: No    Emotionally Abused: No    Physically Abused: No    Sexually Abused: No    Outpatient Medications Prior to Visit  Medication Sig Dispense Refill   acetic acid -hydrocortisone  (VOSOL -HC) OTIC solution Place 5 drops into both ears 2 (two) times daily. 10 mL 0   Azelastine  HCl 137 MCG/SPRAY SOLN PLACE 1 SPRAY INTO BOTH NOSTRILS 2 (TWO) TIMES DAILY AS DIRECTED 30 mL 1   Cinnamon 500 MG capsule Take 500 mg by mouth daily.     co-enzyme Q-10 30 MG capsule Take 30 mg by mouth 3 (three) times daily.     Continuous Glucose Receiver (DEXCOM G7 RECEIVER) DEVI Use to monitor glucose continuously 1 each 0   Continuous Glucose Sensor (DEXCOM G7 SENSOR) MISC 3 each by Does not apply route every 30 (thirty) days. Apply 1 sensor every 10 days 9 each 4   Continuous Glucose Sensor (DEXCOM G7 SENSOR) MISC Use to check glucose continuously, change sensor every 10 days.ONU:[id number] EXP:02/14/2025 2 each 0   diclofenac  sodium (VOLTAREN ) 1 % GEL APPLY 4 G TOPICALLY 4 (FOUR) TIMES DAILY. 100 g 1   fenofibrate  160 MG tablet Take 1 tablet (160 mg total) by mouth daily. 90 tablet 0   fluticasone  (FLONASE ) 50 MCG/ACT nasal spray Place 2 sprays into both nostrils daily. 16 g 6   insulin  NPH Human (HUMULIN N) 100 UNIT/ML injection INJECT 70 UNITS EACH MORNING AND 15 UNITS EACH EVENING.     Insulin  Syringe-Needle U-100  (BD INSULIN  SYRINGE U/F) 31G X 5/16 1 ML MISC 1 EACH BY OTHER ROUTE 2 (TWO) TIMES DAILY. AS DIRECTED 200 each 3   JARDIANCE  10 MG TABS tablet TAKE 1 TABLET BY MOUTH DAILY BEFORE BREAKFAST. 30 tablet 11   levothyroxine  (SYNTHROID )  112 MCG tablet TAKE 1 TABLET BY MOUTH DAILY BEFORE BREAKFAST. 90 tablet 1   loratadine  (CLARITIN ) 10 MG tablet TAKE 1 TABLET BY MOUTH EVERY DAY 90 tablet 1   losartan  (COZAAR ) 100 MG tablet TAKE 1 TABLET BY MOUTH EVERY DAY 90 tablet 1   Omega 3-6-9 Fatty Acids (OMEGA 3-6-9 COMPLEX PO) Take by mouth.     tamsulosin (FLOMAX) 0.4 MG CAPS capsule Take 0.4 mg by mouth daily.     Turmeric 1053 MG TABS Take by mouth.     No facility-administered medications prior to visit.    Allergies  Allergen Reactions   [name] [Yohimbine] Palpitations    Review of Systems  Constitutional:  Negative for fever and malaise/fatigue.  HENT:  Negative for congestion.   Eyes:  Negative for blurred vision.  Respiratory:  Negative for shortness of breath.   Cardiovascular:  Negative for chest pain, palpitations and leg swelling.  Gastrointestinal:  Negative for abdominal pain, blood in stool and nausea.  Genitourinary:  Negative for dysuria and frequency.  Musculoskeletal:  Negative for falls.  Skin:  Negative for rash.  Neurological:  Negative for dizziness, loss of consciousness and headaches.  Endo/Heme/Allergies:  Negative for environmental allergies.  Psychiatric/Behavioral:  Negative for depression. The patient is not nervous/anxious.        Objective:    Physical Exam Vitals and nursing note reviewed.  Constitutional:      General: He is not in acute distress.    Appearance: Normal appearance. He is well-developed.  HENT:     Head: Normocephalic and atraumatic.  Eyes:     General: No scleral icterus.       Right eye: No discharge.        Left eye: No discharge.  Cardiovascular:     Rate and Rhythm: Normal rate and regular rhythm.     Heart sounds: No murmur  heard. Pulmonary:     Effort: Pulmonary effort is normal. No respiratory distress.     Breath sounds: Normal breath sounds.  Musculoskeletal:        General: Normal range of motion.     Cervical back: Normal range of motion and neck supple.     Right lower leg: No edema.     Left lower leg: No edema.  Skin:    General: Skin is warm and dry.  Neurological:     Mental Status: He is alert and oriented to person, place, and time.  Psychiatric:        Mood and Affect: Mood normal.        Behavior: Behavior normal.        Thought Content: Thought content normal.        Judgment: Judgment normal.     BP 120/60 (BP Location: Right Arm, Patient Position: Sitting)   Pulse 62   Temp 98 F (36.7 C) (Oral)   Resp 18   Ht 6' 2 (1.88 m)   Wt 295 lb (133.8 kg)   SpO2 97%   BMI 37.88 kg/m  Wt Readings from Last 3 Encounters:  01/14/24 295 lb (133.8 kg)  01/03/24 293 lb (132.9 kg)  12/26/23 287 lb 9.6 oz (130.5 kg)    Diabetic Foot Exam - Simple   No data filed    Lab Results  Component Value Date   WBC 4.2 07/05/2023   HGB 12.7 (L) 07/05/2023   HCT 37.9 (L) 07/05/2023   PLT 259.0 07/05/2023   GLUCOSE 70 07/05/2023   CHOL 172 07/05/2023  TRIG 132.0 07/05/2023   HDL 46.80 07/05/2023   LDLDIRECT 107.0 06/10/2020   LDLCALC 99 07/05/2023   ALT 29 07/05/2023   AST 30 07/05/2023   NA 139 07/05/2023   K 4.9 07/05/2023   CL 103 07/05/2023   CREATININE 1.60 (H) 07/05/2023   BUN 27 (H) 07/05/2023   CO2 28 07/05/2023   TSH 2.02 07/05/2023   PSA 3.91 03/31/2022   HGBA1C 5.6 10/29/2023   MICROALBUR 0.2 09/05/2019    Lab Results  Component Value Date   TSH 2.02 07/05/2023   Lab Results  Component Value Date   WBC 4.2 07/05/2023   HGB 12.7 (L) 07/05/2023   HCT 37.9 (L) 07/05/2023   MCV 94.7 07/05/2023   PLT 259.0 07/05/2023   Lab Results  Component Value Date   NA 139 07/05/2023   K 4.9 07/05/2023   CO2 28 07/05/2023   GLUCOSE 70 07/05/2023   BUN 27 (H)  07/05/2023   CREATININE 1.60 (H) 07/05/2023   BILITOT 0.5 07/05/2023   ALKPHOS 36 (L) 07/05/2023   AST 30 07/05/2023   ALT 29 07/05/2023   PROT 7.8 07/05/2023   ALBUMIN 5.0 07/05/2023   CALCIUM  10.1 07/05/2023   ANIONGAP 8 08/01/2019   EGFR 38.0 11/02/2022   GFR 44.94 (L) 07/05/2023   Lab Results  Component Value Date   CHOL 172 07/05/2023   Lab Results  Component Value Date   HDL 46.80 07/05/2023   Lab Results  Component Value Date   LDLCALC 99 07/05/2023   Lab Results  Component Value Date   TRIG 132.0 07/05/2023   Lab Results  Component Value Date   CHOLHDL 4 07/05/2023   Lab Results  Component Value Date   HGBA1C 5.6 10/29/2023       Assessment & Plan:  Controlled type 2 diabetes mellitus with stage 3 chronic kidney disease, with long-term current use of insulin  (HCC)  Type 2 diabetes mellitus without complication, with long-term current use of insulin  (HCC) -     Comprehensive metabolic panel with GFR -     Microalbumin / creatinine urine ratio  Primary hypertension Assessment & Plan: Well controlled, no changes to meds. Encouraged heart healthy diet such as the DASH diet and exercise as tolerated.    Orders: -     CBC with Differential/Platelet -     Comprehensive metabolic panel with GFR -     Lipid panel  Hyperlipidemia associated with type 2 diabetes mellitus (HCC) -     CBC with Differential/Platelet -     Comprehensive metabolic panel with GFR -     Lipid panel  Hypothyroidism following radioiodine therapy Assessment & Plan: Check labs  On synthroid    Orders: -     TSH  Stage 3b chronic kidney disease (HCC) Assessment & Plan: Check labs    Malignant neoplasm of prostate [facility]) Assessment & Plan: Per urology   Type 2 diabetes mellitus without complication, without long-term current use of insulin  [facility]) Assessment & Plan: Per endo     Jamee JONELLE Antonio Cyndee, DO

## 2024-01-14 NOTE — Assessment & Plan Note (Signed)
 Per u rology

## 2024-01-14 NOTE — Assessment & Plan Note (Signed)
 Well controlled, no changes to meds. Encouraged heart healthy diet such as the DASH diet and exercise as tolerated.

## 2024-01-15 ENCOUNTER — Other Ambulatory Visit

## 2024-01-15 DIAGNOSIS — E119 Type 2 diabetes mellitus without complications: Secondary | ICD-10-CM

## 2024-01-15 DIAGNOSIS — I10 Essential (primary) hypertension: Secondary | ICD-10-CM

## 2024-01-15 DIAGNOSIS — E890 Postprocedural hypothyroidism: Secondary | ICD-10-CM

## 2024-01-15 DIAGNOSIS — E1169 Type 2 diabetes mellitus with other specified complication: Secondary | ICD-10-CM

## 2024-01-15 NOTE — Addendum Note (Signed)
 Addended by: DORLENE CHIQUITA RAMAN on: 01/15/2024 02:59 PM   Modules accepted: Orders

## 2024-01-15 NOTE — Addendum Note (Signed)
 Addended by: DORLENE CHIQUITA RAMAN on: 01/15/2024 03:05 PM   Modules accepted: Orders

## 2024-01-15 NOTE — Addendum Note (Signed)
 Addended by: DORLENE CHIQUITA RAMAN on: 01/15/2024 03:01 PM   Modules accepted: Orders

## 2024-01-15 NOTE — Addendum Note (Signed)
 Addended by: DORLENE CHIQUITA RAMAN on: 01/15/2024 02:58 PM   Modules accepted: Orders

## 2024-01-16 ENCOUNTER — Telehealth: Payer: Self-pay

## 2024-01-16 DIAGNOSIS — E785 Hyperlipidemia, unspecified: Secondary | ICD-10-CM | POA: Diagnosis not present

## 2024-01-16 DIAGNOSIS — I10 Essential (primary) hypertension: Secondary | ICD-10-CM | POA: Diagnosis not present

## 2024-01-16 DIAGNOSIS — E890 Postprocedural hypothyroidism: Secondary | ICD-10-CM | POA: Diagnosis not present

## 2024-01-16 DIAGNOSIS — E1169 Type 2 diabetes mellitus with other specified complication: Secondary | ICD-10-CM | POA: Diagnosis not present

## 2024-01-16 NOTE — Telephone Encounter (Signed)
 Pt came in 01/15/24 -- labs sent to [facility] , pt has copy of labs as well    Copied from CRM #[id number]. Topic: Clinical - Request for Lab/Test Order >> Jan 15, 2024  1:21 PM Aisha D wrote: Reason for CRM: Pt stated that he came in for labs yesterday but was unable to complete them and was told he needed to schedule an appt to come back. Pt would like to go to [facility] in [place] instead to get the labs completed and would like for the orders to be faxed. Pt would like a callback with an update.

## 2024-01-17 LAB — LIPID PANEL
Chol/HDL Ratio: 4.3 ratio (ref 0.0–5.0)
Cholesterol, Total: 176 mg/dL (ref 100–199)
HDL: 41 mg/dL (ref >39)
LDL Chol Calc (NIH): 106 mg/dL — ABNORMAL HIGH (ref 0–99)
Triglycerides: 164 mg/dL — ABNORMAL HIGH (ref 0–149)
VLDL Cholesterol Cal: 29 mg/dL (ref 5–40)

## 2024-01-17 LAB — CBC
Hematocrit: 36.3 % — ABNORMAL LOW (ref 37.5–51.0)
Hemoglobin: 12.1 g/dL — ABNORMAL LOW (ref 13.0–17.7)
MCH: 31.4 pg (ref 26.6–33.0)
MCHC: 33.3 g/dL (ref 31.5–35.7)
MCV: 94 fL (ref 79–97)
Platelets: 256 x10E3/uL (ref 150–450)
RBC: 3.85 x10E6/uL — ABNORMAL LOW (ref 4.14–5.80)
RDW: 13.2 % (ref 11.6–15.4)
WBC: 6 x10E3/uL (ref 3.4–10.8)

## 2024-01-17 LAB — COMPREHENSIVE METABOLIC PANEL WITH GFR
ALT: 35 IU/L (ref 0–44)
AST: 31 IU/L (ref 0–40)
Albumin: 4.6 g/dL (ref 3.9–4.9)
Alkaline Phosphatase: 48 IU/L (ref 47–123)
BUN/Creatinine Ratio: 23 (ref 10–24)
BUN: 34 mg/dL — ABNORMAL HIGH (ref 8–27)
Bilirubin Total: 0.3 mg/dL (ref 0.0–1.2)
CO2: 24 mmol/L (ref 20–29)
Calcium: 10.2 mg/dL (ref 8.6–10.2)
Chloride: 100 mmol/L (ref 96–106)
Creatinine, Ser: 1.51 mg/dL — ABNORMAL HIGH (ref 0.76–1.27)
Globulin, Total: 2.5 g/dL (ref 1.5–4.5)
Glucose: 118 mg/dL — ABNORMAL HIGH (ref 70–99)
Potassium: 5 mmol/L (ref 3.5–5.2)
Sodium: 138 mmol/L (ref 134–144)
Total Protein: 7.1 g/dL (ref 6.0–8.5)
eGFR: 51 mL/min/1.73 — ABNORMAL LOW (ref >59)

## 2024-01-17 LAB — TSH: TSH: 2.29 u[IU]/mL (ref 0.450–4.500)

## 2024-01-23 ENCOUNTER — Other Ambulatory Visit: Payer: Self-pay | Admitting: Family Medicine

## 2024-01-23 DIAGNOSIS — E119 Type 2 diabetes mellitus without complications: Secondary | ICD-10-CM

## 2024-01-23 DIAGNOSIS — E890 Postprocedural hypothyroidism: Secondary | ICD-10-CM

## 2024-01-26 ENCOUNTER — Ambulatory Visit: Payer: Self-pay | Admitting: Family Medicine

## 2024-02-11 NOTE — Telephone Encounter (Signed)
 Sample never pick up.   Placed back in our stock  Device/Supplies: Dexcom G7 Quantity:2 ONU:[id number] EXP:02/14/2025

## 2024-03-07 ENCOUNTER — Encounter: Payer: Self-pay | Admitting: Internal Medicine

## 2024-03-07 ENCOUNTER — Ambulatory Visit: Admitting: Internal Medicine

## 2024-03-07 VITALS — BP 136/52 | HR 67 | Resp 18 | Ht 73.0 in | Wt 293.0 lb

## 2024-03-07 DIAGNOSIS — E119 Type 2 diabetes mellitus without complications: Secondary | ICD-10-CM

## 2024-03-07 DIAGNOSIS — Z794 Long term (current) use of insulin: Secondary | ICD-10-CM | POA: Diagnosis not present

## 2024-03-07 DIAGNOSIS — E890 Postprocedural hypothyroidism: Secondary | ICD-10-CM

## 2024-03-07 DIAGNOSIS — E041 Nontoxic single thyroid nodule: Secondary | ICD-10-CM

## 2024-03-07 LAB — POCT GLYCOSYLATED HEMOGLOBIN (HGB A1C): Hemoglobin A1C: 6.1 % — AB (ref 4.0–5.6)

## 2024-03-07 MED ORDER — EMPAGLIFLOZIN 10 MG PO TABS: 10.0000 mg | ORAL_TABLET | Freq: Every day | ORAL | 3 refills | Status: AC

## 2024-03-07 MED ORDER — DEXCOM G7 SENSOR MISC: 3.0000 | 4 refills | Status: AC

## 2024-03-07 NOTE — Progress Notes (Signed)
 Patient ID: Alex Rocha, male   DOB: Jul 27, 1955, 68 y.o.   MRN: [id number]  HPI: Alex Rocha is a 68 y.o.-year-old male, returning for follow-up for DM2, dx in 2000, insulin -dependent since 2014, controlled, with complications (cerebellar CVA, CKD stage III, peripheral neuropathy) and also thyroid  nodule and post ablative hypothyroidism. previously saw Dr. Kassie, last visit with me 4 months ago.  Interim history: No increased urination, blurry vision, nausea, chest pain. He continues to have memory loss and also fatigue after his CVA (but improved). He was exercising more - both cardio and strength. He hurt his knee since last OV >> torn meniscus >> could not exercise for 1.5 months. He now restarted 2 weeks ago. He had a steroid inj. In knee >> sugars increased to 250, but improved afterwards.  DM2: Reviewed HbA1c: Lab Results  Component Value Date   HGBA1C 5.6 10/29/2023   HGBA1C 6.1 (A) 05/01/2023   HGBA1C 6.3 (A) 12/29/2022   HGBA1C 6.9 (A) 06/22/2022   HGBA1C 6.8 (H) 03/31/2022   HGBA1C 6.4 10/25/2021   HGBA1C 6.0 (A) 08/04/2021   HGBA1C 5.9 (A) 03/31/2021   HGBA1C 5.7 (A) 11/29/2020   HGBA1C 7.1 (H) 09/14/2020   Pt is on a regimen of: - NPH 80 units in a.m. and 30 units at night >> 70 units in a.m. and 15 units at night - Jardiance  10 mg before breakfast - started 06/2022 Also on cinnamon 500 mg daily. He tried Metformin  in the past - tolerated well, from what he can remember  Pt checks his sugars >4x a day a day and they are:  Prev.:  Prev.:  Lowest sugar was 51 - seldom >> 67 >> 42; he has hypoglycemia awareness at 70.  Highest sugar was 250 >> 229 >> 200.  Glucometer: none  Pt's meals are: - Breakfast:  fruit - Lunch: salad , sandwich - Dinner: meat (chicken or grilled pork or beef) + starch (low carb pasta) + veggies; veggie burgers; may eat out - Snacks: no sweet drinks; crackers or cookies  - + stage 3 CKD - sees nephrology, last BUN/creatinine:   Lab Results  Component Value Date   BUN 34 (H) 01/16/2024   BUN 27 (H) 07/05/2023   CREATININE 1.51 (H) 01/16/2024   CREATININE 1.60 (H) 07/05/2023   Lab Results  Component Value Date   MICRALBCREAT 10.0 01/14/2024   MICRALBCREAT 9 10/19/2021   MICRALBCREAT 4 09/05/2019   MICRALBCREAT 2 06/18/2015  On Cozaar  100 mg daily.  -+ HL; last set of lipids: Lab Results  Component Value Date   CHOL 176 01/16/2024   HDL 41 01/16/2024   LDLCALC 106 (H) 01/16/2024   LDLDIRECT 107.0 06/10/2020   TRIG 164 (H) 01/16/2024   CHOLHDL 4.3 01/16/2024  On fenofibrate  160 mg daily, omega-3 fatty acids.  - last eye exam was in 01/09/2024. No DR reportedly. He had cataract Sx - summer 2023.  - + numbness and tingling in his feet. Last foot exam 05/01/2023.  Thyroid  nodule:  Thyroid  U/S (01/01/2013): Right thyroid  lobe: 6.6 x 2.9 x 2.9 cm several rounded nodules are present  within the right lobe. The largest is a 2.3 x 1.6 cm solid nodule in  the mid right thyroid  gland.   Left thyroid  lobe: 5.7 x 2.4 x 2.4 cm.  No nodules visualized.   Isthmus Thickness: 0.4 cm   No nodules visualized.   Lymphadenopathy: None visualized.   IMPRESSION:  1. Multiple nodules within the right lobe of  thyroid  gland. The  largest nodule measures 2.3 cm and is solid. Findings meet consensus  criteria for biopsy.   Thyroid  uptake and scan (01/21/2013): Normal 24 hr radio iodine uptake of 17.3%.  Dominant right lobe nodule measuring 2.3 cm diameter on ultrasound is cold on scintigraphy.  Malignancy not excluded.   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  Solitary 15 x 17 x 16 mm hypoechoic solid nodule in the lower pole of the right gland. This has decreased in size compared to 23 x 17 x 16 mm previously. The background thyroid  parenchyma is mildly heterogeneous.   Left thyroid  lobe: 3.8 x 1.3 x 1.5 cm. Small and  relatively heterogeneous thyroid  gland. No discrete nodule.   Isthmus Thickness: 0.5 cm.  No nodules visualized.   Lymphadenopathy: None visualized.   IMPRESSION: 1. Interval involution of the previously noted right lower pole thyroid  nodule which presently measures a maximum of 17 mm compared to 23 mm on 01/01/2013. Decrease in size over time is highly consistent with a benign process. 2. No additional thyroid  nodules identified. 3. The background thyroid  gland is mildly heterogeneous.  Pt denies: - feeling nodules in neck - hoarseness - dysphagia - choking  Post ablative hypothyroidism  Pt is on levothyroxine  112 mcg daily, taken: - in am - fasting - at least 30 min from b'fast - no calcium  - no iron - no multivitamins - no PPIs - not on Biotin On B12.  Reviewed his TSH levels: Lab Results  Component Value Date   TSH 2.290 01/16/2024   TSH 2.02 07/05/2023   TSH 2.30 11/24/2022   TSH 1.89 03/31/2022   TSH 1.59 10/25/2021   TSH 0.65 09/29/2021   TSH 1.82 12/10/2020   TSH 2.49 06/10/2020   TSH 2.42 03/09/2020   TSH 1.47 10/02/2019   TSH 0.76 09/11/2019   TSH 5.02 (H) 08/08/2019   TSH 3.62 05/29/2019   TSH 3.25 02/07/2019   TSH 1.58 12/04/2017   TSH 2.08 08/14/2016   TSH 2.50 06/18/2015   TSH 4.060 04/24/2014   TSH 2.70 03/09/2014   TSH 2.69 12/08/2013   No FH of thyroid  cancer. No h/o radiation tx to head or neck. No herbal supplements. No Biotin use. No recent steroids use.   He also has a history of OSA, HTN, prostate cancer, kidney stones, low testosterone, cataract.  ROS: + see HPI +  Past Medical History:  Diagnosis Date   Anemia    Cataract    Colon polyps    Diabetes mellitus    Environmental allergies    Hyperlipidemia    Hypertension    Hyperthyroidism    Kidney stones    Low testosterone    Prostate cancer [facility])    Sleep apnea    Past Surgical History:  Procedure Laterality Date   CATARACT EXTRACTION Left 09/2021   CATARACT  EXTRACTION Right 11/2021   PROSTATE BIOPSY     VASECTOMY     Social History   Socioeconomic History   Marital status: Married    Spouse name: Not on file   Number of children: 2   Years of education: Not on file   Highest education level: Not on file  Occupational History   Occupation: location manager  Tobacco Use   Smoking status: Never   Smokeless tobacco: Never  Vaping Use   Vaping status: Never Used  Substance and Sexual Activity  Alcohol use: Yes    Comment: rare - on social occasions   Drug use: No   Sexual activity: Yes  Other Topics Concern   Not on file  Social History Narrative   Exercise----not as much--- 1-2 x a week    Social Drivers of Health   Financial Resource Strain: Low Risk  (12/26/2023)   Overall Financial Resource Strain (CARDIA)    Difficulty of Paying Living Expenses: Not very hard  Food Insecurity: No Food Insecurity (12/26/2023)   Hunger Vital Sign    Worried About Running Out of Food in the Last Year: Never true    Ran Out of Food in the Last Year: Never true  Transportation Needs: No Transportation Needs (12/26/2023)   PRAPARE - Administrator, Civil Service (Medical): No    Lack of Transportation (Non-Medical): No  Physical Activity: Insufficiently Active (12/26/2023)   Exercise Vital Sign    Days of Exercise per Week: 4 days    Minutes of Exercise per Session: 30 min  Stress: No Stress Concern Present (12/26/2023)   [employer] of Occupational Health - Occupational Stress Questionnaire    Feeling of Stress: Not at all  Social Connections: Socially Integrated (12/26/2023)   Social Connection and Isolation Panel    Frequency of Communication with Friends and Family: More than three times a week    Frequency of Social Gatherings with Friends and Family: More than three times a week    Attends Religious Services: More than 4 times per year    Active Member of [employer] or Organizations: No    Attends Museum/gallery Exhibitions Officer: More than 4 times per year    Marital Status: Married  Catering Manager Violence: Not At Risk (12/26/2023)   Humiliation, Afraid, Rape, and Kick questionnaire    Fear of Current or Ex-Partner: No    Emotionally Abused: No    Physically Abused: No    Sexually Abused: No   Current Outpatient Medications on File Prior to Visit  Medication Sig Dispense Refill   acetic acid -hydrocortisone  (VOSOL -HC) OTIC solution Place 5 drops into both ears 2 (two) times daily. 10 mL 0   Azelastine  HCl 137 MCG/SPRAY SOLN PLACE 1 SPRAY INTO BOTH NOSTRILS 2 (TWO) TIMES DAILY AS DIRECTED 30 mL 1   Cinnamon 500 MG capsule Take 500 mg by mouth daily.     co-enzyme Q-10 30 MG capsule Take 30 mg by mouth 3 (three) times daily.     Continuous Glucose Receiver (DEXCOM G7 RECEIVER) DEVI Use to monitor glucose continuously 1 each 0   Continuous Glucose Sensor (DEXCOM G7 SENSOR) MISC 3 each by Does not apply route every 30 (thirty) days. Apply 1 sensor every 10 days 9 each 4   Continuous Glucose Sensor (DEXCOM G7 SENSOR) MISC Use to check glucose continuously, change sensor every 10 days.ONU:[id number] EXP:02/14/2025 2 each 0   diclofenac  sodium (VOLTAREN ) 1 % GEL APPLY 4 G TOPICALLY 4 (FOUR) TIMES DAILY. 100 g 1   fenofibrate  160 MG tablet Take 1 tablet (160 mg total) by mouth daily. 90 tablet 0   fluticasone  (FLONASE ) 50 MCG/ACT nasal spray Place 2 sprays into both nostrils daily. 16 g 6   insulin  NPH Human (HUMULIN N) 100 UNIT/ML injection INJECT 80 UNITS EACH MORNING, AND 60 UNITS EACH EVENING. 120 mL 1   Insulin  Syringe-Needle U-100 (BD INSULIN  SYRINGE U/F) 31G X 5/16 1 ML MISC 1 EACH BY OTHER ROUTE 2 (TWO) TIMES DAILY. AS DIRECTED 200 each  3   JARDIANCE  10 MG TABS tablet TAKE 1 TABLET BY MOUTH DAILY BEFORE BREAKFAST. 30 tablet 11   levothyroxine  (SYNTHROID ) 112 MCG tablet TAKE 1 TABLET BY MOUTH DAILY BEFORE BREAKFAST. 90 tablet 1   loratadine  (CLARITIN ) 10 MG tablet TAKE 1 TABLET BY MOUTH EVERY  DAY 90 tablet 1   losartan  (COZAAR ) 100 MG tablet TAKE 1 TABLET BY MOUTH EVERY DAY 90 tablet 1   Omega 3-6-9 Fatty Acids (OMEGA 3-6-9 COMPLEX PO) Take by mouth.     tamsulosin (FLOMAX) 0.4 MG CAPS capsule Take 0.4 mg by mouth daily.     Turmeric 1053 MG TABS Take by mouth.     No current facility-administered medications on file prior to visit.   Allergies  Allergen Reactions   [name] [Yohimbine] Palpitations   Family History  Problem Relation Age of Onset   Colon cancer Mother    Heart disease Mother    Hypertension Mother        Mother's side of the family   Diabetes Mother        Mother's side of the family   Hyperlipidemia Sister    Hypertension Sister    Diabetes Sister    Heart disease Sister    Osteoarthritis Sister    Diabetes Sister    Colon cancer Brother    Cancer Paternal Aunt        unknown type   Rectal cancer Other    Stomach cancer Other    Heart disease Other    Diabetes Other        father's family   Breast cancer Neg Hx    Pancreatic cancer Neg Hx    PE: BP (!) 136/52   Pulse 67   Resp 18   Ht 6' 1 (1.854 m)   Wt 293 lb (132.9 kg)   SpO2 97%   BMI 38.66 kg/m  Wt Readings from Last 10 Encounters:  03/07/24 293 lb (132.9 kg)  01/14/24 295 lb (133.8 kg)  01/03/24 293 lb (132.9 kg)  12/26/23 287 lb 9.6 oz (130.5 kg)  10/29/23 285 lb 9.6 oz (129.5 kg)  10/25/23 287 lb 9.6 oz (130.5 kg)  07/10/23 291 lb (132 kg)  07/05/23 293 lb 12.8 oz (133.3 kg)  05/01/23 292 lb 12.8 oz (132.8 kg)  12/29/22 298 lb 12.8 oz (135.5 kg)   Constitutional: overweight, in NAD Eyes:  EOMI, + B exophthalmos ENT: no neck masses, no cervical lymphadenopathy Cardiovascular: RRR, No MRG Respiratory: CTA B Musculoskeletal: no deformities Skin:no rashes Neurological: no tremor with outstretched hands Diabetic Foot Exam - Simple   Simple Foot Form Diabetic Foot exam was performed with the following findings: Yes 03/07/2024  3:55 PM  Visual Inspection No  deformities, no ulcerations, no other skin breakdown bilaterally: Yes Sensation Testing Intact to touch and monofilament testing bilaterally: Yes Pulse Check Posterior Tibialis and Dorsalis pulse intact bilaterally: Yes Comments + absent halluceal toenails (prev. Removed 2/2 ingrown toenails)    ASSESSMENT: 1. DM2, insulin -dependent, controlled, with complications - CVA -cerebellum -seen on MRI 04/26/2022 - CKD stage 3 - PN  2. Right thyroid  nodule  3. Post ablative hypothyroidism  PLAN:  1. Patient with longstanding, uncontrolled, type 2 diabetes, on intermediate acting insulin  and SGLT2 inhibitor, with improving control.  At last visit, HbA1c was excellent, at 5.6%, decreased from 6.1%.  Sugars were fluctuating within the target range with occasional low blood sugars between 4 and 7 AM, but with a trend of dropping his blood sugars  overnight.  I advised him to reduce his NPH dose at night by 50%.  We discussed about potentially stopping the NPH at night but we did not do so at last visit.  We also discussed that the reason why his blood sugar started to improve so much is the fact that he started to exercise which improved his insulin  sensitivity.  He also lost 7 pounds before last visit. CGM interpretation: -At today's visit, we reviewed his CGM downloads: It appears that 96% of values are in target range (goal >70%), while 3% are higher than 180 (goal <25%), and 1% are lower than 70 (goal <4%).  The calculated average blood sugar is 124.  The projected HbA1c for the next 3 months (GMI) is 6.3%. -Reviewing the CGM trends, sugars appear to fluctuate within the target range, increasing slightly after breakfast, then dropping occasionally too low around 3 PM, and increasing slightly after dinner.  Especially now that he is again exercising and due to the lows in the afternoon, I advised him to reduce the NPH in the morning.  We can continue Farxiga at the same dose. -At today's visit, we  discussed about possibly using Mounjaro, for both diabetes and weight loss and as an adjunct for obstructive sleep apnea.  I advised him that this is in the same class with Ozempic.  He will think about it and let me know if he wanted to try it. - I suggested to:  Patient Instructions  Please decrease: - NPH 60 units in a.m. and 15 units at night - Jardiance  10 mg before b'fast  Think about Mounjaro.   Please continue Levothyroxine  112 mcg daily.   Take the thyroid  hormone every day, with water, at least 30 minutes before breakfast, separated by at least 4 hours from: - acid reflux medications - calcium  - iron - multivitamins   Please return for another visit in 4-6 months.   - we checked his HbA1c: 6.1% (slightly higher, but still at goal) - advised to check sugars at different times of the day - 4x a day, rotating check times - advised for yearly eye exams >> he is UTD - return to clinic in 4-6 months  2. Right thyroid  nodule - With history of benign biopsy -see HPI - He had RAI treatment and the size of the nodule and the rest of the thyroid  decreased afterwards. - Follow-up ultrasound from 2016 showed a much smaller R nodule, with no need for follow-up - No neck compression symptoms or masses felt on palpation of his neck today  3. Post ablative hypothyroidism - latest thyroid  labs reviewed with pt. >> normal: Lab Results  Component Value Date   TSH 2.290 01/16/2024  - he continues on LT4 112 mcg daily - pt feels good on this dose. - we discussed about taking the thyroid  hormone every day, with water, >30 minutes before breakfast, separated by >4 hours from acid reflux medications, calcium , iron, multivitamins. Pt. is taking it correctly.  Lela Fendt, MD PhD [facility] Endocrinology

## 2024-03-07 NOTE — Patient Instructions (Addendum)
 Please decrease: - NPH 60 units in a.m. and 15 units at night - Jardiance  10 mg before b'fast  Think about Mounjaro.   Please continue Levothyroxine  112 mcg daily.   Take the thyroid  hormone every day, with water, at least 30 minutes before breakfast, separated by at least 4 hours from: - acid reflux medications - calcium  - iron - multivitamins   Please return for another visit in 4-6 months.

## 2024-03-16 ENCOUNTER — Other Ambulatory Visit: Payer: Self-pay | Admitting: Internal Medicine

## 2024-03-26 ENCOUNTER — Other Ambulatory Visit: Payer: Self-pay | Admitting: Family Medicine

## 2024-04-29 ENCOUNTER — Ambulatory Visit: Admitting: Psychology

## 2024-05-27 ENCOUNTER — Encounter: Admitting: Psychology

## 2024-09-04 ENCOUNTER — Ambulatory Visit: Admitting: Internal Medicine

## 2024-12-30 ENCOUNTER — Ambulatory Visit
